# Patient Record
Sex: Female | Born: 1960
Health system: Southern US, Community
[De-identification: ages and names within clinical notes are randomized; demographics above are authoritative.]

## PROBLEM LIST (undated history)

## (undated) DIAGNOSIS — T7840XA Allergy, unspecified, initial encounter: Secondary | ICD-10-CM

## (undated) DIAGNOSIS — G4730 Sleep apnea, unspecified: Secondary | ICD-10-CM

## (undated) DIAGNOSIS — E039 Hypothyroidism, unspecified: Secondary | ICD-10-CM

## (undated) DIAGNOSIS — M1990 Unspecified osteoarthritis, unspecified site: Secondary | ICD-10-CM

## (undated) DIAGNOSIS — Z87440 Personal history of urinary (tract) infections: Secondary | ICD-10-CM

## (undated) HISTORY — PX: KNEE SURGERY: SHX244

## (undated) HISTORY — DX: Allergy, unspecified, initial encounter: T78.40XA

## (undated) HISTORY — DX: Hypothyroidism, unspecified: E03.9

## (undated) HISTORY — DX: Personal history of urinary (tract) infections: Z87.440

## (undated) HISTORY — PX: JOINT REPLACEMENT: SHX530

## (undated) HISTORY — DX: Unspecified osteoarthritis, unspecified site: M19.90

## (undated) HISTORY — PX: TUBAL LIGATION: SHX77

## (undated) HISTORY — DX: Sleep apnea, unspecified: G47.30

---

## 1998-06-28 HISTORY — PX: ENDOMETRIAL ABLATION: SHX621

## 2001-07-04 ENCOUNTER — Other Ambulatory Visit: Admission: RE | Admit: 2001-07-04 | Discharge: 2001-07-04 | Payer: Self-pay | Admitting: Family Medicine

## 2004-08-04 ENCOUNTER — Ambulatory Visit: Payer: Self-pay | Admitting: Family Medicine

## 2005-07-08 ENCOUNTER — Ambulatory Visit: Payer: Self-pay | Admitting: Obstetrics and Gynecology

## 2005-09-08 ENCOUNTER — Ambulatory Visit: Payer: Self-pay | Admitting: Family Medicine

## 2006-10-12 ENCOUNTER — Ambulatory Visit: Payer: Self-pay | Admitting: Obstetrics and Gynecology

## 2006-12-13 ENCOUNTER — Other Ambulatory Visit: Payer: Self-pay

## 2006-12-13 ENCOUNTER — Ambulatory Visit: Payer: Self-pay | Admitting: Obstetrics and Gynecology

## 2007-12-12 ENCOUNTER — Ambulatory Visit: Payer: Self-pay | Admitting: Obstetrics and Gynecology

## 2008-12-23 ENCOUNTER — Ambulatory Visit: Payer: Self-pay | Admitting: Obstetrics and Gynecology

## 2008-12-24 ENCOUNTER — Ambulatory Visit: Payer: Self-pay | Admitting: Obstetrics and Gynecology

## 2009-01-31 ENCOUNTER — Ambulatory Visit: Payer: Self-pay | Admitting: Gastroenterology

## 2009-12-30 ENCOUNTER — Ambulatory Visit: Payer: Self-pay | Admitting: Obstetrics and Gynecology

## 2011-04-06 ENCOUNTER — Ambulatory Visit: Payer: Self-pay | Admitting: Internal Medicine

## 2011-04-20 ENCOUNTER — Ambulatory Visit: Payer: Self-pay | Admitting: Gastroenterology

## 2012-04-25 ENCOUNTER — Ambulatory Visit: Payer: Self-pay | Admitting: Family Medicine

## 2013-06-08 ENCOUNTER — Telehealth: Payer: Self-pay | Admitting: Internal Medicine

## 2013-06-08 NOTE — Telephone Encounter (Signed)
left message for pt to call office please let her know about appointment date and time

## 2013-06-08 NOTE — Telephone Encounter (Signed)
Pt called stating you saw her friend Zaida hill this morning and wanted to know if you would take her as a new patient

## 2013-06-08 NOTE — Telephone Encounter (Signed)
Ok to schedule.

## 2013-06-13 NOTE — Telephone Encounter (Signed)
Per vanessa notes pt aware of appointment

## 2013-06-28 HISTORY — PX: BREAST BIOPSY: SHX20

## 2013-09-28 ENCOUNTER — Ambulatory Visit: Payer: Self-pay | Admitting: Internal Medicine

## 2013-11-05 ENCOUNTER — Ambulatory Visit (INDEPENDENT_AMBULATORY_CARE_PROVIDER_SITE_OTHER): Payer: BC Managed Care – PPO | Admitting: Internal Medicine

## 2013-11-05 ENCOUNTER — Encounter: Payer: Self-pay | Admitting: Internal Medicine

## 2013-11-05 ENCOUNTER — Encounter (INDEPENDENT_AMBULATORY_CARE_PROVIDER_SITE_OTHER): Payer: Self-pay

## 2013-11-05 VITALS — BP 110/70 | HR 82 | Temp 99.2°F | Ht 66.0 in | Wt 203.8 lb

## 2013-11-05 DIAGNOSIS — G4710 Hypersomnia, unspecified: Secondary | ICD-10-CM

## 2013-11-05 DIAGNOSIS — Z1239 Encounter for other screening for malignant neoplasm of breast: Secondary | ICD-10-CM

## 2013-11-05 DIAGNOSIS — R400 Somnolence: Secondary | ICD-10-CM

## 2013-11-05 DIAGNOSIS — Z9109 Other allergy status, other than to drugs and biological substances: Secondary | ICD-10-CM

## 2013-11-05 DIAGNOSIS — E039 Hypothyroidism, unspecified: Secondary | ICD-10-CM

## 2013-11-05 DIAGNOSIS — R109 Unspecified abdominal pain: Secondary | ICD-10-CM

## 2013-11-05 DIAGNOSIS — R5381 Other malaise: Secondary | ICD-10-CM

## 2013-11-05 DIAGNOSIS — E7800 Pure hypercholesterolemia, unspecified: Secondary | ICD-10-CM

## 2013-11-05 DIAGNOSIS — R5383 Other fatigue: Secondary | ICD-10-CM

## 2013-11-05 DIAGNOSIS — N951 Menopausal and female climacteric states: Secondary | ICD-10-CM

## 2013-11-05 NOTE — Progress Notes (Signed)
Pre visit review using our clinic review tool, if applicable. No additional management support is needed unless otherwise documented below in the visit note. 

## 2013-11-12 ENCOUNTER — Encounter: Payer: Self-pay | Admitting: Internal Medicine

## 2013-11-12 DIAGNOSIS — R5383 Other fatigue: Secondary | ICD-10-CM | POA: Insufficient documentation

## 2013-11-12 DIAGNOSIS — E7800 Pure hypercholesterolemia, unspecified: Secondary | ICD-10-CM | POA: Insufficient documentation

## 2013-11-12 DIAGNOSIS — R109 Unspecified abdominal pain: Secondary | ICD-10-CM | POA: Insufficient documentation

## 2013-11-12 DIAGNOSIS — N951 Menopausal and female climacteric states: Secondary | ICD-10-CM | POA: Insufficient documentation

## 2013-11-12 DIAGNOSIS — Z9109 Other allergy status, other than to drugs and biological substances: Secondary | ICD-10-CM | POA: Insufficient documentation

## 2013-11-12 DIAGNOSIS — R400 Somnolence: Secondary | ICD-10-CM | POA: Insufficient documentation

## 2013-11-12 DIAGNOSIS — E039 Hypothyroidism, unspecified: Secondary | ICD-10-CM | POA: Insufficient documentation

## 2013-11-12 NOTE — Assessment & Plan Note (Addendum)
Low cholesterol diet and exercise.  Will follow.  Follow lipid panel.

## 2013-11-12 NOTE — Assessment & Plan Note (Signed)
Saw Dr Kerrie PleasureMoryati.  Was placed on Armour Thyroid.  States stable on this regimen.  Follow.  Check tsh.

## 2013-11-12 NOTE — Progress Notes (Signed)
   Subjective:    Patient ID: Carol Jimenez, female    DOB: 07/23/1960, 53 y.o.   MRN: [id number]  HPI 53 year old female with past history of hypothyroidism and allergies.  She comes in today to follow up on these issues as well as to establish care.  Former patient of [facility], Dr Toya SmothersKnowles Jonas, Dr Kerrie PleasureMoryati and Dr Bethann PunchesMark Miller.  She states she takes hctz for fluid retention.  Saw Dr Kerrie PleasureMoryati for her thyroid.  This has been stable on her current regimen.  She does have problems with mood swings and hot flashes.  States she stays hot.  Previously had post menopausal spotting.  Biopsy negative.  No further bleeding.  She does report "constant pain" in her right side.  Has been present for several years.  Notices when she lies down.  Had an ultrasound.  Diagnosed with fatty liver.  Also report some fatigue.  Increased daytime somnolence.  Wakes up tired and does not feel rested.     Past Medical History  Diagnosis Date  . Allergy   . Hypothyroidism   . Hx: UTI (urinary tract infection)     Outpatient Encounter Prescriptions as of 11/05/2013  Medication Sig  . estradiol (ESTRACE) 1 MG tablet Take 1 mg by mouth daily.  . hydrochlorothiazide (HYDRODIURIL) 25 MG tablet Take 25 mg by mouth daily.  . progesterone (PROMETRIUM) 100 MG capsule Take 100 mg by mouth daily.  Marland Kitchen. thyroid (ARMOUR) 90 MG tablet Take 90 mg by mouth daily.    Review of Systems Patient denies any headache, lightheadedness or dizziness.  No significant sinus symptoms now.  No chest pain, tightness or palpitations.  No increased shortness of breath, cough or congestion.  No nausea or vomiting.  No acid reflux.  No abdominal pain or cramping.  No bowel change, such as diarrhea, constipation, BRBPR or melana.  No urine change.   Increased fatigue and daytime somnolence as outlined.  Persistent right side pain as outlined.       Objective:   Physical Exam Filed Vitals:   11/05/13 1337  BP: 110/70  Pulse: 82  Temp: 99.2  F (37.3 C)   Blood pressure recheck:  44120/3976  53 year old female in no acute distress.   HEENT:  Nares- clear.  Oropharynx - without lesions. NECK:  Supple.  Nontender.  No audible bruit.  HEART:  Appears to be regular. LUNGS:  No crackles or wheezing audible.  Respirations even and unlabored.  RADIAL PULSE:  Equal bilaterally.   ABDOMEN:  Soft, nontender.  Bowel sounds present and normal.  No audible abdominal bruit.    EXTREMITIES:  No increased edema present.  DP pulses palpable and equal bilaterally.          Assessment & Plan:  HEALTH MAINTENANCE.  Schedule her for a physical next visit.  States last 18 months ago.  Obtain records.  Schedule mammogram.  Last colonoscopy 2012 - Dr Marva PandaSkulskie.    I spent 45 minutes with the patient and more than 50% of the time was spent in consultation regarding the above.

## 2013-11-12 NOTE — Assessment & Plan Note (Signed)
Has hot flashes and mood swings.  Stays hot.  On estradiol and prometrium.  Discussed other treatment options including Effexor.  Follow.

## 2013-11-12 NOTE — Assessment & Plan Note (Addendum)
Does report some increased fatigue.  Discussed possible etiologies.  Review recent labs.  Obtain split night sleep study as outlined.  Follow.

## 2013-11-12 NOTE — Assessment & Plan Note (Signed)
Does not feel rested.  Increased fatigue.  Snoring.  Hard to lose weight and describes some lower extremity swelling.  Concern over possible sleep apnea.  Will obtain split night sleep study.  Further w/up and treatment pending results.

## 2013-11-12 NOTE — Assessment & Plan Note (Signed)
Persistent right side pain.  Some reproducible pain to palpation over the right lower anterior ribs.  Had ultrasound previously.  States told had fatty liver.  Obtain records.  Further w/up pending review.  Check routine labs.

## 2013-11-12 NOTE — Assessment & Plan Note (Signed)
Stable

## 2014-01-09 ENCOUNTER — Telehealth: Payer: Self-pay | Admitting: *Deleted

## 2014-01-09 NOTE — Telephone Encounter (Signed)
I saw her as a new pt in 5/15.  I assume this denial is for her sleep study.  We had discussed sleep study at her appt.  Notify her that she can contact her insurance company and see where they will cover a sleep study and then we can reschedule.   Thanks.

## 2014-01-09 NOTE — Telephone Encounter (Signed)
Received a letter stating that pt cannot be studied in their lab due to insurance conflicts. (pt was notified via detailed voicemail). Insurance has denied due to lack of meeting criteria for in-lab study by medical direction.

## 2014-01-09 NOTE — Telephone Encounter (Signed)
Received more information from [facility] regarding the Sleep Study denial. Its seems that they would prefer trying a "in-home" sleep study test. Or they are needing more documentation to cover the testing to meet criteria. Information placed in your folder for review.

## 2014-01-09 NOTE — Telephone Encounter (Signed)
Will review when I return to work.

## 2014-01-21 ENCOUNTER — Ambulatory Visit: Payer: Self-pay | Admitting: Internal Medicine

## 2014-01-23 ENCOUNTER — Encounter: Payer: Self-pay | Admitting: General Surgery

## 2014-01-23 ENCOUNTER — Telehealth: Payer: Self-pay | Admitting: *Deleted

## 2014-01-23 ENCOUNTER — Encounter: Payer: Self-pay | Admitting: Internal Medicine

## 2014-01-23 ENCOUNTER — Ambulatory Visit: Payer: Self-pay | Admitting: Internal Medicine

## 2014-01-23 ENCOUNTER — Other Ambulatory Visit: Payer: Self-pay | Admitting: Internal Medicine

## 2014-01-23 DIAGNOSIS — R928 Other abnormal and inconclusive findings on diagnostic imaging of breast: Secondary | ICD-10-CM

## 2014-01-23 LAB — HM MAMMOGRAPHY

## 2014-01-23 NOTE — Telephone Encounter (Signed)
Spoke to pt.  Discussed mammogram resutls and need for biopsy.  After discussion, we will refer her to surgery for evaluation and biopsy.  I have placed the order for the referral.  Pt aware.  Please let Marchelle Folksmanda know.  Thanks.

## 2014-01-23 NOTE — Telephone Encounter (Signed)
Left detailed message on Carol Jimenez's identified VM.

## 2014-01-23 NOTE — Telephone Encounter (Signed)
Carol Jimenez from [facility] called states it is recommended pt to have a Core Biopsy of Left Breast based on mammogram and additional films.  Requests whether pt to be scheduled by [facility] or if we will refer pt to surgeon.  Please advise

## 2014-01-23 NOTE — Progress Notes (Signed)
Pt notified of mammogram results - Bir[facility] IV.  Informed of need for biopsy.  She agrees to referral to Dr Lemar LivingsByrnett.  Order placed for referral.

## 2014-01-30 ENCOUNTER — Other Ambulatory Visit: Payer: BC Managed Care – PPO

## 2014-01-30 ENCOUNTER — Ambulatory Visit (INDEPENDENT_AMBULATORY_CARE_PROVIDER_SITE_OTHER): Payer: BC Managed Care – PPO | Admitting: General Surgery

## 2014-01-30 ENCOUNTER — Encounter: Payer: Self-pay | Admitting: General Surgery

## 2014-01-30 VITALS — BP 150/82 | HR 80 | Resp 12 | Ht 66.0 in | Wt 202.0 lb

## 2014-01-30 DIAGNOSIS — N63 Unspecified lump in unspecified breast: Secondary | ICD-10-CM

## 2014-01-30 DIAGNOSIS — R928 Other abnormal and inconclusive findings on diagnostic imaging of breast: Secondary | ICD-10-CM

## 2014-01-30 NOTE — Patient Instructions (Addendum)
Continue self breast exams. Call office for any new breast issues or concerns.    CARE AFTER BREAST BIOPSY  1. Leave the dressing on that your doctor applied after surgery. It is waterproof. You may bathe, shower and/or swim. The dressing will probably remain intact until your return office visit. If the dressing comes off, you will see small strips of tape against your skin on the incision. Do not remove these strips.  2. You may want to use a gauze,cloth or similar protection in your bra to prevent rubbing against your dressing and incision. This is not necessary, but you may feel more comfortable doing so.  3. It is recommended that you wear a bra day and night to give support to the breast. This will prevent the weight of the breast from pulling on the incision.  4. Your breast will feel hard and lumpy under the incision. Do not be alarmed. This is the underlying stitching of tissue. Softening of this tissue will occur in time.  5. Make sure you call the office and schedule an appointment in one week after your surgery. The office phone number is [phone]. The nurses at [facility] may have already done this for you.  6. You will notice about a week after your office visit that the strips of the tape on your incision will begin to loosen. These may then be removed.  7. Report to your doctor any of the following:  * Severe pain not relieved by your pain medication  *Redness of the incision  * Drainage from the incision  *Fever greater than 101 degrees 

## 2014-01-30 NOTE — Progress Notes (Signed)
Patient ID: Carol Jimenez, female   DOB: February 25, 1961, 52 y.o.   MRN: [id number]  Chief Complaint  Patient presents with  . Other    mammogram    HPI Carol Jimenez is a 53 y.o. female.  who presents for a breast evaluation. The most recent mammogram and ultrasound was done on 01-21-14.  Patient does not perform regular self breast checks and gets regular mammograms done.  She denies any breast injury or trauma. Denies any family history of breast cancer. States she can not feel anything different in the breast.  HPI  Past Medical History  Diagnosis Date  . Allergy   . Hypothyroidism   . Hx: UTI (urinary tract infection)     Past Surgical History  Procedure Laterality Date  . Cesarean section    . Endometrial ablation  2000    Family History  Problem Relation Age of Onset  . Congestive Heart Failure Mother   . Diabetes Mother   . Hypertension Father   . Colon polyps    . Esophageal cancer Father 38    [facility]  . COPD Mother     Social History History  Substance Use Topics  . Smoking status: Never Smoker   . Smokeless tobacco: Never Used  . Alcohol Use: Yes     Comment: rare    Allergies  Allergen Reactions  . Sulfa Antibiotics Rash    Current Outpatient Prescriptions  Medication Sig Dispense Refill  . estradiol (ESTRACE) 1 MG tablet Take 1 mg by mouth daily.      . hydrochlorothiazide (HYDRODIURIL) 25 MG tablet Take 25 mg by mouth daily.      . meloxicam (MOBIC) 15 MG tablet Take 15 mg by mouth daily.      . montelukast (SINGULAIR) 10 MG tablet Take 10 mg by mouth at bedtime.      . progesterone (PROMETRIUM) 100 MG capsule Take 100 mg by mouth daily.      Marland Kitchen thyroid (ARMOUR) 90 MG tablet Take 90 mg by mouth daily.       No current facility-administered medications for this visit.    Review of Systems Review of Systems  Constitutional: Negative.   Respiratory: Negative.   Cardiovascular: Negative.     Blood pressure 150/82, pulse 80, resp.  rate 12, height 5\' 6"  (1.676 m), weight 202 lb (91.627 kg), last menstrual period 12/26/2012.  Physical Exam Physical Exam  Constitutional: She is oriented to person, place, and time. She appears well-developed and well-nourished.  Neck: Neck supple.  Cardiovascular: Normal rate, regular rhythm and normal heart sounds.   Pulmonary/Chest: Effort normal and breath sounds normal. Right breast exhibits no inverted nipple, no mass, no nipple discharge, no skin change and no tenderness. Left breast exhibits no inverted nipple, no mass, no nipple discharge, no skin change and no tenderness.  Lymphadenopathy:    She has no cervical adenopathy.    She has no axillary adenopathy.  Neurological: She is alert and oriented to person, place, and time.  Skin: Skin is warm and dry.    Data Reviewed Bilateral screening mammograms completed 01/21/2014 suggested out the mass the left breast. BI-RAD-0. Review of the 2013 films with retrospective eyes suggest possible presents at that time.  Focal spot compression views and ultrasound dated 01/23/2014 showed a 9 mm mixed echogenicity mass at the 6:30 o'clock position of the left breast for which biopsy was recommended. BI-RAD-4.  Ultrasound examination of the left breast with special attention to  the lower inner quadrant at the 6:00 position, 4 cm from the nipple showed a 0.6 x 0.7 x 0.7 cm mixed echogenicity area with a focal acoustic shadowing corresponding to the [facility] study. The patient was amenable to core biopsy. This was completed using 6 cc of 0.5% Xylocaine with 0.25% Marcaine with 1-200,000 units of epinephrine. A 14-gauge vacuum biopsy device was used and a core samples obtained. A postbiopsy clip was placed. Skin defect was closed with benzoin and Steri-Strips followed by Telfa and Tegaderm dressing. The procedure was well tolerated. Postbiopsy instructions were provided.  Assessment    Developing asymmetry in the left breast.    Plan    The  patient will be contacted when the pathology is available. Assuming benign result she'll be asked to return in 6 months with a repeat mammogram of the left breast.     PCP: Letta MedianScott, Charlene   Marquett Bertoli W 02/01/2014, 10:05 AM

## 2014-01-31 ENCOUNTER — Telehealth: Payer: Self-pay | Admitting: *Deleted

## 2014-01-31 LAB — PATHOLOGY

## 2014-01-31 NOTE — Telephone Encounter (Signed)
Notified patient as instructed, no cancer per Dr. Lemar LivingsByrnett, patient pleased. Discussed follow-up appointments next week, patient agrees

## 2014-02-01 DIAGNOSIS — R928 Other abnormal and inconclusive findings on diagnostic imaging of breast: Secondary | ICD-10-CM | POA: Insufficient documentation

## 2014-02-06 ENCOUNTER — Ambulatory Visit (INDEPENDENT_AMBULATORY_CARE_PROVIDER_SITE_OTHER): Payer: Self-pay | Admitting: *Deleted

## 2014-02-06 DIAGNOSIS — N63 Unspecified lump in unspecified breast: Secondary | ICD-10-CM

## 2014-02-06 NOTE — Progress Notes (Signed)
Patient here today for follow up post left breast biopsy.  Minimal bruising noted.  The patient is aware that a heating pad may be used for comfort as needed.  Aware of pathology. Follow up as scheduled in six months.

## 2014-02-12 ENCOUNTER — Encounter: Payer: Self-pay | Admitting: Internal Medicine

## 2014-02-12 ENCOUNTER — Other Ambulatory Visit (HOSPITAL_COMMUNITY)
Admission: RE | Admit: 2014-02-12 | Discharge: 2014-02-12 | Disposition: A | Discharge status: Home / Self Care | Payer: BC Managed Care – PPO | Source: Ambulatory Visit | Attending: Internal Medicine | Admitting: Internal Medicine

## 2014-02-12 ENCOUNTER — Ambulatory Visit (INDEPENDENT_AMBULATORY_CARE_PROVIDER_SITE_OTHER): Payer: BC Managed Care – PPO | Admitting: Internal Medicine

## 2014-02-12 VITALS — BP 120/80 | HR 84 | Temp 98.2°F | Ht 66.25 in | Wt 205.5 lb

## 2014-02-12 DIAGNOSIS — E039 Hypothyroidism, unspecified: Secondary | ICD-10-CM

## 2014-02-12 DIAGNOSIS — R928 Other abnormal and inconclusive findings on diagnostic imaging of breast: Secondary | ICD-10-CM

## 2014-02-12 DIAGNOSIS — Z01419 Encounter for gynecological examination (general) (routine) without abnormal findings: Secondary | ICD-10-CM | POA: Insufficient documentation

## 2014-02-12 DIAGNOSIS — Z1151 Encounter for screening for human papillomavirus (HPV): Secondary | ICD-10-CM | POA: Diagnosis present

## 2014-02-12 DIAGNOSIS — E559 Vitamin D deficiency, unspecified: Secondary | ICD-10-CM

## 2014-02-12 DIAGNOSIS — N951 Menopausal and female climacteric states: Secondary | ICD-10-CM

## 2014-02-12 DIAGNOSIS — E7800 Pure hypercholesterolemia, unspecified: Secondary | ICD-10-CM

## 2014-02-12 DIAGNOSIS — R5381 Other malaise: Secondary | ICD-10-CM

## 2014-02-12 DIAGNOSIS — Z124 Encounter for screening for malignant neoplasm of cervix: Secondary | ICD-10-CM

## 2014-02-12 DIAGNOSIS — R5383 Other fatigue: Secondary | ICD-10-CM

## 2014-02-12 DIAGNOSIS — Z9109 Other allergy status, other than to drugs and biological substances: Secondary | ICD-10-CM

## 2014-02-12 MED ORDER — ESTRADIOL 0.5 MG PO TABS: 0.5000 mg | ORAL_TABLET | Freq: Every day | ORAL | Status: DC

## 2014-02-12 NOTE — Progress Notes (Signed)
Pre visit review using our clinic review tool, if applicable. No additional management support is needed unless otherwise documented below in the visit note. 

## 2014-02-14 LAB — CYTOLOGY - PAP

## 2014-02-15 ENCOUNTER — Encounter: Payer: Self-pay | Admitting: Internal Medicine

## 2014-02-18 ENCOUNTER — Encounter: Payer: Self-pay | Admitting: Internal Medicine

## 2014-02-18 ENCOUNTER — Encounter: Payer: Self-pay | Admitting: *Deleted

## 2014-02-18 ENCOUNTER — Telehealth: Payer: Self-pay | Admitting: Internal Medicine

## 2014-02-18 NOTE — Assessment & Plan Note (Signed)
Had hot flashes and mood swings.  On estradiol and prometrium.  Have discussed other treatment options including Effexor.  We will decrease her estrogen to 1/2 of her current dose daily.  Follow.  Plan to eventually taper off in the future.

## 2014-02-18 NOTE — Telephone Encounter (Signed)
Per previous visit, a split night sleep study was ordered.  If pt had, need to obtain results.  If not performed, need to see if pt agreeable to be scheduled.

## 2014-02-18 NOTE — Telephone Encounter (Signed)
My chart message sent to pt.

## 2014-02-18 NOTE — Progress Notes (Signed)
   Subjective:    Patient ID: Carol Jimenez, female    DOB: [birth year], 53 y.o.   MRN: [id number]  HPI 53 year old female with past history of hypothyroidism and allergies.  She comes in today to follow up on these issues as well as for a complete physical exam.   She states she takes hctz for fluid retention.  Saw Dr Kerrie Pleasure for her thyroid.  This has been stable on her current regimen.  She reports some increased fatigue.  Just recently saw Dr Lemar Livings for  Abnormal mammogram.  Biopsy negative.  Is planning for f/u mammogram in 2/16.  Will continue to be followed by Dr Lemar Livings.  Is on estrogen.  Has been on estrogen at least 5-6 years.  Was previously not sleeping well.  This is controlled.  We discussed stopping or tapering estrogen.       Past Medical History  Diagnosis Date  . Allergy   . Hypothyroidism   . Hx: UTI (urinary tract infection)     Outpatient Encounter Prescriptions as of 02/12/2014  Medication Sig  . hydrochlorothiazide (HYDRODIURIL) 25 MG tablet Take 25 mg by mouth daily.  . meloxicam (MOBIC) 15 MG tablet Take 15 mg by mouth daily.  . montelukast (SINGULAIR) 10 MG tablet Take 10 mg by mouth at bedtime.  . progesterone (PROMETRIUM) 100 MG capsule Take 100 mg by mouth daily.  Marland Kitchen thyroid (ARMOUR) 90 MG tablet Take 90 mg by mouth daily.  . [DISCONTINUED] estradiol (ESTRACE) 1 MG tablet Take 1 mg by mouth daily.  Marland Kitchen estradiol (ESTRACE) 0.5 MG tablet Take 1 tablet (0.5 mg total) by mouth daily.    Review of Systems Patient denies any headache, lightheadedness or dizziness.  No significant sinus symptoms.  No chest pain, tightness or palpitations.  No increased shortness of breath, cough or congestion.  No nausea or vomiting.  No acid reflux.  No abdominal pain or cramping.  No bowel change, such as diarrhea, constipation, BRBPR or melana.  No urine change.   Increased fatigue as outlined.       Objective:   Physical Exam  Filed Vitals:   02/12/14 1038  BP: 120/80    Pulse: 84  Temp: 98.2 F (76.64 C)   53 year old female in no acute distress.   HEENT:  Nares- clear.  Oropharynx - without lesions. NECK:  Supple.  Nontender.  No audible bruit.  HEART:  Appears to be regular. LUNGS:  No crackles or wheezing audible.  Respirations even and unlabored.  RADIAL PULSE:  Equal bilaterally.    BREASTS:  No nipple discharge or nipple retraction present.  Could not appreciate any distinct nodules or axillary adenopathy.  ABDOMEN:  Soft, nontender.  Bowel sounds present and normal.  No audible abdominal bruit.  GU:  Normal external genitalia.  Vaginal vault without lesions.  Cervix identified.  Pap performed. Could not appreciate any adnexal masses or tenderness.   RECTAL:  Heme negative.   EXTREMITIES:  No increased edema present.  DP pulses palpable and equal bilaterally.          Assessment & Plan:  HEALTH MAINTENANCE.  Physical today.  Recent abnormal mammogram.  Saw dr Lemar Livings.  S/p benign biopsy.  planning for f/up mammogram in 07/2014.   Last colonoscopy 2012 - Dr Marva Panda.    I spent 25 minutes with the patient and more than 50% of the time was spent in consultation regarding the above.

## 2014-02-18 NOTE — Assessment & Plan Note (Signed)
Saw Dr Byrnett.  S/p biopsy.  Benign.  Due to f/u with Dr Byrnett and f/u mammogram in 07/2014.    

## 2014-02-18 NOTE — Telephone Encounter (Signed)
Mailed unread [employer] along with one sent this morning

## 2014-02-18 NOTE — Telephone Encounter (Signed)
Mailed unread mychart message

## 2014-02-18 NOTE — Telephone Encounter (Signed)
Sent mychart message

## 2014-02-18 NOTE — Assessment & Plan Note (Signed)
Stable

## 2014-02-18 NOTE — Assessment & Plan Note (Signed)
Low cholesterol diet and exercise.  Will follow.  Follow lipid panel.    

## 2014-02-18 NOTE — Telephone Encounter (Signed)
See my chart message

## 2014-02-18 NOTE — Assessment & Plan Note (Signed)
Does report some increased fatigue.  Have discussed possible etiologies.  Was scheduled for split night sleep study.  Need to obtain results.

## 2014-02-18 NOTE — Assessment & Plan Note (Signed)
Saw Dr Kerrie Pleasure.  Was placed on Armour Thyroid.  States stable on this regimen.  Follow.  Follow tsh.

## 2014-02-19 ENCOUNTER — Other Ambulatory Visit (INDEPENDENT_AMBULATORY_CARE_PROVIDER_SITE_OTHER): Payer: BC Managed Care – PPO

## 2014-02-19 DIAGNOSIS — R5381 Other malaise: Secondary | ICD-10-CM

## 2014-02-19 DIAGNOSIS — E7800 Pure hypercholesterolemia, unspecified: Secondary | ICD-10-CM

## 2014-02-19 DIAGNOSIS — E559 Vitamin D deficiency, unspecified: Secondary | ICD-10-CM

## 2014-02-19 DIAGNOSIS — R5383 Other fatigue: Secondary | ICD-10-CM

## 2014-02-19 LAB — LIPID PANEL
Cholesterol: 188 mg/dL (ref 0–200)
HDL: 44.2 mg/dL (ref >39.00)
NonHDL: 143.8
Total CHOL/HDL Ratio: 4
Triglycerides: 242 mg/dL — ABNORMAL HIGH (ref 0.0–149.0)
VLDL: 48.4 mg/dL — ABNORMAL HIGH (ref 0.0–40.0)

## 2014-02-19 LAB — CBC WITH DIFFERENTIAL/PLATELET
Basophils Absolute: 0 10*3/uL (ref 0.0–0.1)
Basophils Relative: 0.4 % (ref 0.0–3.0)
Eosinophils Absolute: 0 10*3/uL (ref 0.0–0.7)
Eosinophils Relative: 0.7 % (ref 0.0–5.0)
HEMATOCRIT: 41.7 % (ref 36.0–46.0)
HEMOGLOBIN: 14.4 g/dL (ref 12.0–15.0)
LYMPHS ABS: 1.6 10*3/uL (ref 0.7–4.0)
Lymphocytes Relative: 23.8 % (ref 12.0–46.0)
MCHC: 34.4 g/dL (ref 30.0–36.0)
MCV: 94.2 fl (ref 78.0–100.0)
MONOS PCT: 8.6 % (ref 3.0–12.0)
Monocytes Absolute: 0.6 10*3/uL (ref 0.1–1.0)
NEUTROS ABS: 4.5 10*3/uL (ref 1.4–7.7)
Neutrophils Relative %: 66.5 % (ref 43.0–77.0)
Platelets: 217 10*3/uL (ref 150.0–400.0)
RBC: 4.43 Mil/uL (ref 3.87–5.11)
RDW: 13.1 % (ref 11.5–15.5)
WBC: 6.8 10*3/uL (ref 4.0–10.5)

## 2014-02-19 LAB — TSH: TSH: 1.6 u[IU]/mL (ref 0.35–4.50)

## 2014-02-19 LAB — COMPREHENSIVE METABOLIC PANEL
ALT: 32 U/L (ref 0–35)
AST: 44 U/L — ABNORMAL HIGH (ref 0–37)
Albumin: 3.8 g/dL (ref 3.5–5.2)
Alkaline Phosphatase: 52 U/L (ref 39–117)
BILIRUBIN TOTAL: 0.6 mg/dL (ref 0.2–1.2)
BUN: 11 mg/dL (ref 6–23)
CALCIUM: 8.9 mg/dL (ref 8.4–10.5)
CHLORIDE: 100 meq/L (ref 96–112)
CO2: 29 mEq/L (ref 19–32)
CREATININE: 0.6 mg/dL (ref 0.4–1.2)
GFR: 102.79 mL/min (ref >60.00)
Glucose, Bld: 104 mg/dL — ABNORMAL HIGH (ref 70–99)
Potassium: 3.7 mEq/L (ref 3.5–5.1)
Sodium: 136 mEq/L (ref 135–145)
Total Protein: 7.1 g/dL (ref 6.0–8.3)

## 2014-02-19 LAB — VITAMIN D 25 HYDROXY (VIT D DEFICIENCY, FRACTURES): VITD: 34.25 ng/mL (ref 30.00–100.00)

## 2014-02-19 LAB — LDL CHOLESTEROL, DIRECT: Direct LDL: 124.7 mg/dL

## 2014-02-20 ENCOUNTER — Encounter: Payer: Self-pay | Admitting: *Deleted

## 2014-02-20 ENCOUNTER — Encounter: Payer: Self-pay | Admitting: Internal Medicine

## 2014-02-20 ENCOUNTER — Other Ambulatory Visit: Payer: Self-pay | Admitting: Internal Medicine

## 2014-02-20 DIAGNOSIS — R739 Hyperglycemia, unspecified: Secondary | ICD-10-CM

## 2014-02-20 DIAGNOSIS — R945 Abnormal results of liver function studies: Secondary | ICD-10-CM

## 2014-02-20 DIAGNOSIS — R7989 Other specified abnormal findings of blood chemistry: Secondary | ICD-10-CM

## 2014-02-20 NOTE — Progress Notes (Signed)
Order placed for f/u labs.  

## 2014-02-22 ENCOUNTER — Encounter: Payer: Self-pay | Admitting: Internal Medicine

## 2014-03-07 ENCOUNTER — Encounter: Payer: Self-pay | Admitting: Internal Medicine

## 2014-03-07 ENCOUNTER — Other Ambulatory Visit (INDEPENDENT_AMBULATORY_CARE_PROVIDER_SITE_OTHER): Payer: BC Managed Care – PPO

## 2014-03-07 DIAGNOSIS — R739 Hyperglycemia, unspecified: Secondary | ICD-10-CM

## 2014-03-07 DIAGNOSIS — R945 Abnormal results of liver function studies: Secondary | ICD-10-CM

## 2014-03-07 DIAGNOSIS — R7309 Other abnormal glucose: Secondary | ICD-10-CM

## 2014-03-07 DIAGNOSIS — R7989 Other specified abnormal findings of blood chemistry: Secondary | ICD-10-CM

## 2014-03-07 LAB — HEPATIC FUNCTION PANEL
ALT: 45 U/L — ABNORMAL HIGH (ref 0–35)
AST: 57 U/L — ABNORMAL HIGH (ref 0–37)
Albumin: 4 g/dL (ref 3.5–5.2)
Alkaline Phosphatase: 51 U/L (ref 39–117)
Bilirubin, Direct: 0.1 mg/dL (ref 0.0–0.3)
Total Bilirubin: 0.6 mg/dL (ref 0.2–1.2)
Total Protein: 7.3 g/dL (ref 6.0–8.3)

## 2014-03-07 LAB — HEMOGLOBIN A1C: HEMOGLOBIN A1C: 5.9 % (ref 4.6–6.5)

## 2014-03-08 NOTE — Telephone Encounter (Signed)
Order placed for abdominal ultrasound to f/u on abnormal liver function tests.  Pt notified via my chart.

## 2014-03-13 ENCOUNTER — Ambulatory Visit: Payer: Self-pay | Admitting: Internal Medicine

## 2014-03-15 ENCOUNTER — Telehealth: Payer: Self-pay | Admitting: Internal Medicine

## 2014-03-15 NOTE — Telephone Encounter (Signed)
Pt notified of ultrasound results via my chart.  Ultrasound reveals fatty liver.

## 2014-03-23 ENCOUNTER — Ambulatory Visit: Payer: BC Managed Care – PPO

## 2014-03-23 ENCOUNTER — Ambulatory Visit (INDEPENDENT_AMBULATORY_CARE_PROVIDER_SITE_OTHER): Payer: BC Managed Care – PPO

## 2014-03-23 DIAGNOSIS — Z23 Encounter for immunization: Secondary | ICD-10-CM

## 2014-04-12 ENCOUNTER — Ambulatory Visit (INDEPENDENT_AMBULATORY_CARE_PROVIDER_SITE_OTHER): Payer: BC Managed Care – PPO | Admitting: Internal Medicine

## 2014-04-12 ENCOUNTER — Encounter: Payer: Self-pay | Admitting: Internal Medicine

## 2014-04-12 VITALS — BP 120/80 | HR 65 | Temp 98.9°F | Ht 66.25 in | Wt 196.5 lb

## 2014-04-12 DIAGNOSIS — R945 Abnormal results of liver function studies: Secondary | ICD-10-CM

## 2014-04-12 DIAGNOSIS — N951 Menopausal and female climacteric states: Secondary | ICD-10-CM

## 2014-04-12 DIAGNOSIS — E7800 Pure hypercholesterolemia, unspecified: Secondary | ICD-10-CM

## 2014-04-12 DIAGNOSIS — R928 Other abnormal and inconclusive findings on diagnostic imaging of breast: Secondary | ICD-10-CM

## 2014-04-12 DIAGNOSIS — E039 Hypothyroidism, unspecified: Secondary | ICD-10-CM

## 2014-04-12 DIAGNOSIS — R7989 Other specified abnormal findings of blood chemistry: Secondary | ICD-10-CM

## 2014-04-12 DIAGNOSIS — E669 Obesity, unspecified: Secondary | ICD-10-CM

## 2014-04-12 DIAGNOSIS — Z9109 Other allergy status, other than to drugs and biological substances: Secondary | ICD-10-CM

## 2014-04-12 DIAGNOSIS — Z91048 Other nonmedicinal substance allergy status: Secondary | ICD-10-CM

## 2014-04-12 MED ORDER — SCOPOLAMINE 1 MG/3DAYS TD PT72: 1.0000 | MEDICATED_PATCH | TRANSDERMAL | Status: DC

## 2014-04-13 ENCOUNTER — Other Ambulatory Visit: Payer: Self-pay | Admitting: Internal Medicine

## 2014-04-13 ENCOUNTER — Encounter: Payer: Self-pay | Admitting: Internal Medicine

## 2014-04-13 DIAGNOSIS — Z6833 Body mass index (BMI) 33.0-33.9, adult: Secondary | ICD-10-CM | POA: Insufficient documentation

## 2014-04-13 DIAGNOSIS — E7800 Pure hypercholesterolemia, unspecified: Secondary | ICD-10-CM

## 2014-04-13 DIAGNOSIS — R7989 Other specified abnormal findings of blood chemistry: Secondary | ICD-10-CM

## 2014-04-13 DIAGNOSIS — R739 Hyperglycemia, unspecified: Secondary | ICD-10-CM

## 2014-04-13 DIAGNOSIS — R945 Abnormal results of liver function studies: Secondary | ICD-10-CM | POA: Insufficient documentation

## 2014-04-13 NOTE — Progress Notes (Signed)
Order placed for f/u labs.  

## 2014-04-13 NOTE — Assessment & Plan Note (Signed)
Low cholesterol diet and exercise.  Will follow.  Follow lipid panel.  Cholesterol just checked LDL - 125, triglycerides 242.  Discussed diet and exercise.

## 2014-04-13 NOTE — Progress Notes (Signed)
Subjective:    Patient ID: Carol HoarElizabeth B Gudino, female    DOB: 01/22/1961, 53 y.o.   MRN: [id number]  HPI 53 year old female with past history of hypothyroidism and allergies.  She comes in today for a scheduled follow up.   She states she takes hctz for fluid retention.  Saw Dr Kerrie PleasureMoryati for her thyroid.  This has been stable on her current regimen.  Just recently saw Dr Lemar LivingsByrnett for  Abnormal mammogram.  Biopsy negative.  Is planning for f/u mammogram in 2/16.  Will continue to be followed by Dr Lemar LivingsByrnett.  Is on estrogen.  Has been on estrogen at least 5-6 years.  Last visit, discussed decreasing th dose.  She is now on 1/2 dose and tolerating.  Has adjusted her diet.  Is exercising.  Has lost weight.  Feels better.         Past Medical History  Diagnosis Date  . Allergy   . Hypothyroidism   . Hx: UTI (urinary tract infection)     Outpatient Encounter Prescriptions as of 04/12/2014  Medication Sig  . estradiol (ESTRACE) 0.5 MG tablet Take 1 tablet (0.5 mg total) by mouth daily.  . hydrochlorothiazide (HYDRODIURIL) 25 MG tablet Take 25 mg by mouth daily.  . meloxicam (MOBIC) 15 MG tablet Take 15 mg by mouth daily.  . montelukast (SINGULAIR) 10 MG tablet Take 10 mg by mouth at bedtime.  . progesterone (PROMETRIUM) 100 MG capsule Take 100 mg by mouth daily.  Marland Kitchen. thyroid (ARMOUR) 90 MG tablet Take 90 mg by mouth daily.  Marland Kitchen. scopolamine (TRANSDERM-SCOP) 1 MG/3DAYS Place 1 patch (1.5 mg total) onto the skin every 3 (three) days.    Review of Systems Patient denies any headache, lightheadedness or dizziness.  No sinus congestion or drainage.   No chest pain, tightness or palpitations.  No increased shortness of breath, cough or congestion.  No nausea or vomiting.  No acid reflux.  No abdominal pain or cramping.  No bowel change, such as diarrhea, constipation, BRBPR or melana.  No urine change.   Has adjusted her diet.  Exercising.  Has lost weight.  On 1/2 estrogen now.  Tolerating.  Some hot flashes.        Objective:   Physical Exam  Filed Vitals:   04/12/14 0936  BP: 120/80  Pulse: 65  Temp: 98.9 F (37.2 C)   Blood pressure recheck:  35112/7476  53 year old female in no acute distress.   HEENT:  Nares- clear.  Oropharynx - without lesions. NECK:  Supple.  Nontender.  No audible bruit.  HEART:  Appears to be regular. LUNGS:  No crackles or wheezing audible.  Respirations even and unlabored.  RADIAL PULSE:  Equal bilaterally.   ABDOMEN:  Soft, nontender.  Bowel sounds present and normal.  No audible abdominal bruit.   EXTREMITIES:  No increased edema present.  DP pulses palpable and equal bilaterally.          Assessment & Plan:  HEALTH MAINTENANCE.  Physical 02/12/14.  Recent abnormal mammogram.  Saw dr Lemar LivingsByrnett.  S/p benign biopsy.  planning for f/up mammogram in 07/2014.   Last colonoscopy 2012 - Dr Marva PandaSkulskie.    Problem List Items Addressed This Visit   Abnormal liver function tests - Primary     Found to have elevated ast/alt.  Recent check elevated - AST 57 and ALT 45.  Has adjusted her diet and is exercising.  Follow.       Relevant Orders  Hepatic function panel   Abnormal mammogram of left breast     Saw Dr Lemar LivingsByrnett.  S/p biopsy.  Benign.  Due to f/u with Dr Lemar LivingsByrnett and f/u mammogram in 07/2014.       Environmental allergies     Stable.       Hypercholesterolemia     Low cholesterol diet and exercise.  Will follow.  Follow lipid panel.  Cholesterol just checked LDL - 125, triglycerides 242.  Discussed diet and exercise.        Hypothyroid     Saw Dr Kerrie PleasureMoryati.  Was placed on Armour Thyroid.  Has been stable on this regimen.  Follow.  Follow tsh.       Menopausal symptoms     Has decreased her estrogen to 1/2 dose daily.  Follow.  Plan to eventually taper off in the future.  She is tolerating.  Follow.           Obesity (BMI 30.0-34.9)     Discussed diet and exercise.  Follow.         I spent 25 minutes with the patient and more than 50% of the  time was spent in consultation regarding the above.

## 2014-04-13 NOTE — Assessment & Plan Note (Signed)
Stable

## 2014-04-13 NOTE — Assessment & Plan Note (Signed)
Found to have elevated ast/alt.  Recent check elevated - AST 57 and ALT 45.  Has adjusted her diet and is exercising.  Follow.

## 2014-04-13 NOTE — Assessment & Plan Note (Signed)
Saw Dr Kerrie PleasureMoryati.  Was placed on Armour Thyroid.  Has been stable on this regimen.  Follow.  Follow tsh.

## 2014-04-13 NOTE — Assessment & Plan Note (Signed)
Discussed diet and exercise.  Follow.  

## 2014-04-13 NOTE — Assessment & Plan Note (Signed)
Has decreased her estrogen to 1/2 dose daily.  Follow.  Plan to eventually taper off in the future.  She is tolerating.  Follow.

## 2014-04-13 NOTE — Assessment & Plan Note (Signed)
Saw Dr Lemar LivingsByrnett.  S/p biopsy.  Benign.  Due to f/u with Dr Lemar LivingsByrnett and f/u mammogram in 07/2014.

## 2014-04-15 ENCOUNTER — Telehealth: Payer: Self-pay | Admitting: *Deleted

## 2014-04-15 LAB — HEPATIC FUNCTION PANEL
ALBUMIN: 3.5 g/dL (ref 3.5–5.2)
ALK PHOS: 54 U/L (ref 39–117)
ALT: 37 U/L — AB (ref 0–35)
AST: 41 U/L — ABNORMAL HIGH (ref 0–37)
Bilirubin, Direct: 0.1 mg/dL (ref 0.0–0.3)
TOTAL PROTEIN: 7.1 g/dL (ref 6.0–8.3)
Total Bilirubin: 0.7 mg/dL (ref 0.2–1.2)

## 2014-04-15 NOTE — Telephone Encounter (Signed)
Blood sample from 10.16.2015

## 2014-04-15 NOTE — Telephone Encounter (Signed)
Ok.  Let me know if I need to do anything.   

## 2014-04-15 NOTE — Telephone Encounter (Signed)
FYI  [facility] called pt blood hemolyzed and they need a re-draw, called pt she said she is coming now

## 2014-04-16 ENCOUNTER — Telehealth: Payer: Self-pay | Admitting: Internal Medicine

## 2014-04-16 ENCOUNTER — Encounter: Payer: Self-pay | Admitting: Internal Medicine

## 2014-04-16 DIAGNOSIS — R945 Abnormal results of liver function studies: Secondary | ICD-10-CM

## 2014-04-16 DIAGNOSIS — R7989 Other specified abnormal findings of blood chemistry: Secondary | ICD-10-CM

## 2014-04-16 NOTE — Telephone Encounter (Signed)
Pt notified of lab results via my chart.  Needs a f/u non fasting lab in 6-8 weeks.  Please schedule and contact her with a lab appt date and time.  Thanks.    Dr Lorin PicketScott

## 2014-04-22 ENCOUNTER — Encounter: Payer: Self-pay | Admitting: Internal Medicine

## 2014-04-29 ENCOUNTER — Encounter: Payer: Self-pay | Admitting: Internal Medicine

## 2014-05-09 ENCOUNTER — Encounter: Payer: Self-pay | Admitting: Internal Medicine

## 2014-05-28 ENCOUNTER — Other Ambulatory Visit (INDEPENDENT_AMBULATORY_CARE_PROVIDER_SITE_OTHER): Payer: BC Managed Care – PPO

## 2014-05-28 DIAGNOSIS — R945 Abnormal results of liver function studies: Secondary | ICD-10-CM

## 2014-05-28 DIAGNOSIS — E7800 Pure hypercholesterolemia, unspecified: Secondary | ICD-10-CM

## 2014-05-28 DIAGNOSIS — R739 Hyperglycemia, unspecified: Secondary | ICD-10-CM

## 2014-05-28 DIAGNOSIS — R7989 Other specified abnormal findings of blood chemistry: Secondary | ICD-10-CM

## 2014-05-28 LAB — HEMOGLOBIN A1C: Hgb A1c MFr Bld: 6 % (ref 4.6–6.5)

## 2014-05-28 NOTE — Addendum Note (Signed)
Addended by: Cristela BluePULLIAM, Chukwuemeka Artola W on: 05/28/2014 09:08 AM   Modules accepted: Orders

## 2014-05-29 LAB — BASIC METABOLIC PANEL
BUN: 15 mg/dL (ref 6–23)
CO2: 28 meq/L (ref 19–32)
Calcium: 9.1 mg/dL (ref 8.4–10.5)
Chloride: 102 mEq/L (ref 96–112)
Creatinine, Ser: 0.7 mg/dL (ref 0.4–1.2)
GFR: 100.87 mL/min (ref >60.00)
Glucose, Bld: 95 mg/dL (ref 70–99)
POTASSIUM: 4.1 meq/L (ref 3.5–5.1)
SODIUM: 135 meq/L (ref 135–145)

## 2014-05-29 LAB — HEPATIC FUNCTION PANEL
ALT: 37 U/L — ABNORMAL HIGH (ref 0–35)
AST: 39 U/L — ABNORMAL HIGH (ref 0–37)
Albumin: 4.1 g/dL (ref 3.5–5.2)
Alkaline Phosphatase: 54 U/L (ref 39–117)
Bilirubin, Direct: 0.1 mg/dL (ref 0.0–0.3)
Total Bilirubin: 0.6 mg/dL (ref 0.2–1.2)
Total Protein: 6.9 g/dL (ref 6.0–8.3)

## 2014-05-29 LAB — LIPID PANEL
Cholesterol: 201 mg/dL — ABNORMAL HIGH (ref 0–200)
HDL: 41.9 mg/dL (ref >39.00)
NONHDL: 159.1
Total CHOL/HDL Ratio: 5
Triglycerides: 236 mg/dL — ABNORMAL HIGH (ref 0.0–149.0)
VLDL: 47.2 mg/dL — AB (ref 0.0–40.0)

## 2014-05-30 ENCOUNTER — Encounter: Payer: Self-pay | Admitting: Internal Medicine

## 2014-05-30 LAB — LDL CHOLESTEROL, DIRECT: Direct LDL: 119.6 mg/dL

## 2014-07-03 NOTE — Telephone Encounter (Signed)
noted 

## 2014-07-06 ENCOUNTER — Telehealth: Payer: Self-pay | Admitting: Nurse Practitioner

## 2014-07-06 DIAGNOSIS — J209 Acute bronchitis, unspecified: Secondary | ICD-10-CM

## 2014-07-06 MED ORDER — AZITHROMYCIN 250 MG PO TABS: ORAL_TABLET | ORAL | Status: DC

## 2014-07-06 MED ORDER — BENZONATATE 100 MG PO CAPS: 100.0000 mg | ORAL_CAPSULE | Freq: Two times a day (BID) | ORAL | Status: DC | PRN

## 2014-07-06 NOTE — Progress Notes (Signed)
We are sorry that you are not feeling well.  Here is how we plan to help!  Based on what you have shared with me it looks like you have upper respiratory tract inflammation that has resulted in a signification cough.  Inflammation and infection in the upper respiratory tract is commonly called bronchitis and has four common causes:  Allergies, Viral Infections, Acid Reflux and Bacterial Infections.  Allergies, viruses and acid reflux are treated by controlling symptoms or eliminating the cause. An example might be a cough caused by taking certain blood pressure medications. You stop the cough by changing the medication. Another example might be a cough caused by acid reflux. Controlling the reflux helps control the cough.  Based on your presentation I believe you most likely have A cough due to bacteria.  When patients have a fever and a productive cough with a change in color or increased sputum production, we are concerned about bacterial bronchitis.  If left untreated it can progress to pneumonia.  If your symptoms do not improve with your treatment plan it is important that you contact your provider.   I have prescribed Azithromyin 250 mg: two tables now and then one tablet daily for 4 additonal days   In addition you may use A prescription cough medication called Tessalon Perles 100mg. You may take 1-2 capsules every 8 hours as needed for your cough.    HOME CARE . Only take medications as instructed by your medical team. . Complete the entire course of an antibiotic. . Drink plenty of fluids and get plenty of rest. . Avoid close contacts especially the very young and the elderly . Cover your mouth if you cough or cough into your sleeve. . Always remember to wash your hands . A steam or ultrasonic humidifier can help congestion.    GET HELP RIGHT AWAY IF: . You develop worsening fever. . You become short of breath . You cough up blood. . Your symptoms persist after you have completed your  treatment plan MAKE SURE YOU   Understand these instructions.  Will watch your condition.  Will get help right away if you are not doing well or get worse.  Your e-visit answers were reviewed by a board certified advanced clinical practitioner to complete your personal care plan.  Depending on the condition, your plan could have included both over the counter or prescription medications.  If there is a problem please reply  once you have received a response from your provider.  Your safety is important to us.  If you have drug allergies check your prescription carefully.    You can use MyChart to ask questions about today's visit, request a non-urgent call back, or ask for a work or school excuse.  You will get an e-mail in the next two days asking about your experience.  I hope that your e-visit has been valuable and will speed your recovery. Thank you for using e-visits.   

## 2014-08-05 ENCOUNTER — Encounter: Payer: Self-pay | Admitting: General Surgery

## 2014-08-05 ENCOUNTER — Ambulatory Visit: Payer: Self-pay | Admitting: General Surgery

## 2014-08-12 ENCOUNTER — Encounter: Payer: Self-pay | Admitting: General Surgery

## 2014-08-12 ENCOUNTER — Other Ambulatory Visit: Payer: BC Managed Care – PPO

## 2014-08-12 ENCOUNTER — Ambulatory Visit (INDEPENDENT_AMBULATORY_CARE_PROVIDER_SITE_OTHER): Payer: BLUE CROSS/BLUE SHIELD | Admitting: General Surgery

## 2014-08-12 VITALS — BP 132/76 | HR 74 | Resp 14 | Ht 66.0 in | Wt 193.0 lb

## 2014-08-12 DIAGNOSIS — N6489 Other specified disorders of breast: Secondary | ICD-10-CM

## 2014-08-12 DIAGNOSIS — N62 Hypertrophy of breast: Secondary | ICD-10-CM

## 2014-08-12 DIAGNOSIS — R928 Other abnormal and inconclusive findings on diagnostic imaging of breast: Secondary | ICD-10-CM

## 2014-08-12 NOTE — Progress Notes (Signed)
Patient ID: Carol Jimenez, female   DOB: 01/19/1961, 54 y.o.   MRN: [id number]  Chief Complaint  Patient presents with  . Follow-up    mammogram    HPI Carol Jimenez is a 54 y.o. female who presents for a breast evaluation. The most recent left breast mammogram was done on 08/05/14 .  Patient does perform regular self breast checks and gets regular mammograms done.    HPI  Past Medical History  Diagnosis Date  . Allergy   . Hypothyroidism   . Hx: UTI (urinary tract infection)     Past Surgical History  Procedure Laterality Date  . Cesarean section    . Endometrial ablation  2000    Family History  Problem Relation Age of Onset  . Congestive Heart Failure Mother   . Diabetes Mother   . Hypertension Father   . Colon polyps    . Esophageal cancer Father 3160    [facility]  . COPD Mother     Social History History  Substance Use Topics  . Smoking status: Never Smoker   . Smokeless tobacco: Never Used  . Alcohol Use: Yes     Comment: rare    Allergies  Allergen Reactions  . Sulfa Antibiotics Rash    Current Outpatient Prescriptions  Medication Sig Dispense Refill  . azithromycin (ZITHROMAX Z-PAK) 250 MG tablet As directed 6 each 0  . benzonatate (TESSALON) 100 MG capsule Take 1 capsule (100 mg total) by mouth 2 (two) times daily as needed for cough. 20 capsule 0  . estradiol (ESTRACE) 0.5 MG tablet Take 1 tablet (0.5 mg total) by mouth daily. 30 tablet 3  . hydrochlorothiazide (HYDRODIURIL) 25 MG tablet Take 25 mg by mouth daily.    . meloxicam (MOBIC) 15 MG tablet Take 15 mg by mouth daily.    . montelukast (SINGULAIR) 10 MG tablet Take 10 mg by mouth at bedtime.    . progesterone (PROMETRIUM) 100 MG capsule Take 100 mg by mouth daily.    Marland Kitchen. scopolamine (TRANSDERM-SCOP) 1 MG/3DAYS Place 1 patch (1.5 mg total) onto the skin every 3 (three) days. 5 patch 0  . thyroid (ARMOUR) 90 MG tablet Take 90 mg by mouth daily.     No current facility-administered  medications for this visit.    Review of Systems Review of Systems  Constitutional: Negative.   Respiratory: Negative.   Cardiovascular: Negative.     Blood pressure 132/76, pulse 74, resp. rate 14, height 5\' 6"  (1.676 m), weight 193 lb (87.544 kg), last menstrual period 12/26/2012.  Physical Exam Physical Exam  Constitutional: She is oriented to person, place, and time. She appears well-developed and well-nourished.  Eyes: Conjunctivae are normal. No scleral icterus.  Neck: Neck supple.  Cardiovascular: Normal rate, regular rhythm and normal heart sounds.   Pulmonary/Chest: Effort normal and breath sounds normal. Right breast exhibits no inverted nipple, no mass, no nipple discharge, no skin change and no tenderness. Left breast exhibits no inverted nipple, no mass, no nipple discharge, no skin change and no tenderness.  Abdominal: Soft. Bowel sounds are normal. There is no tenderness.  Lymphadenopathy:    She has no cervical adenopathy.    She has no axillary adenopathy.  Neurological: She is alert and oriented to person, place, and time.  Skin: Skin is warm and dry.    Data Reviewed 01/30/2014 left breast biopsy:  LEFT BREAST 6:00: - PSEUDOANGIOMATOUS STROMAL HYPERPLASIA, SCLEROSING ADENOSIS AND MICROCYST FORMATION. - NEGATIVE FOR ATYPIA  AND MALIGNANCY. NOTE: The results of this case were discussed with Dr. Lemar Livings on January 31, 2014 by Dr. Excell Seltzer. XDB/01/31/2014  Left breast mammogram dated 08/05/2014 identifies a metallic clip at the site of the previously noted lesion in the left central breast. BI-RADS-2.  Assessment    Benign breast exam.     Plan    Patient will be asked to return as needed. She should resume annual screening mammograms in fall 2016 with Dr. Lorin Picket.       PCP:  Letta Median 08/12/2014, 12:05 PM

## 2014-08-12 NOTE — Patient Instructions (Signed)
Patient to return as needed. 

## 2014-08-13 ENCOUNTER — Ambulatory Visit: Payer: BC Managed Care – PPO | Admitting: Internal Medicine

## 2014-09-05 ENCOUNTER — Encounter: Payer: Self-pay | Admitting: Internal Medicine

## 2014-10-02 ENCOUNTER — Other Ambulatory Visit: Payer: Self-pay | Admitting: *Deleted

## 2014-10-02 MED ORDER — PROGESTERONE MICRONIZED 100 MG PO CAPS: 100.0000 mg | ORAL_CAPSULE | Freq: Every day | ORAL | Status: DC

## 2014-10-02 MED ORDER — HYDROCHLOROTHIAZIDE 25 MG PO TABS: 25.0000 mg | ORAL_TABLET | Freq: Every day | ORAL | Status: DC

## 2014-10-02 MED ORDER — THYROID 90 MG PO TABS: 90.0000 mg | ORAL_TABLET | Freq: Every day | ORAL | Status: DC

## 2014-10-02 NOTE — Addendum Note (Signed)
Addended by: Chandra BatchNIXON, Zarai Orsborn E on: 10/02/2014 03:14 PM   Modules accepted: Orders

## 2014-10-02 NOTE — Telephone Encounter (Signed)
Refill? Not Rx'd by you previously

## 2014-10-04 ENCOUNTER — Other Ambulatory Visit: Payer: Self-pay

## 2014-10-08 ENCOUNTER — Ambulatory Visit: Payer: Self-pay | Admitting: Internal Medicine

## 2014-10-30 ENCOUNTER — Other Ambulatory Visit (INDEPENDENT_AMBULATORY_CARE_PROVIDER_SITE_OTHER): Payer: BLUE CROSS/BLUE SHIELD

## 2014-10-30 ENCOUNTER — Telehealth: Payer: Self-pay | Admitting: *Deleted

## 2014-10-30 DIAGNOSIS — R739 Hyperglycemia, unspecified: Secondary | ICD-10-CM

## 2014-10-30 DIAGNOSIS — R945 Abnormal results of liver function studies: Secondary | ICD-10-CM

## 2014-10-30 DIAGNOSIS — R7989 Other specified abnormal findings of blood chemistry: Secondary | ICD-10-CM

## 2014-10-30 DIAGNOSIS — E7800 Pure hypercholesterolemia, unspecified: Secondary | ICD-10-CM

## 2014-10-30 NOTE — Telephone Encounter (Signed)
Labs and dx?  

## 2014-10-30 NOTE — Telephone Encounter (Signed)
Order placed for labs.

## 2014-10-31 LAB — HEPATIC FUNCTION PANEL
ALBUMIN: 4 g/dL (ref 3.5–5.2)
ALK PHOS: 60 U/L (ref 39–117)
ALT: 23 U/L (ref 0–35)
AST: 24 U/L (ref 0–37)
Bilirubin, Direct: 0.1 mg/dL (ref 0.0–0.3)
TOTAL PROTEIN: 7.2 g/dL (ref 6.0–8.3)
Total Bilirubin: 0.6 mg/dL (ref 0.2–1.2)

## 2014-10-31 LAB — LIPID PANEL
Cholesterol: 199 mg/dL (ref 0–200)
HDL: 51.9 mg/dL (ref >39.00)
NONHDL: 147.1
TRIGLYCERIDES: 228 mg/dL — AB (ref 0.0–149.0)
Total CHOL/HDL Ratio: 4
VLDL: 45.6 mg/dL — ABNORMAL HIGH (ref 0.0–40.0)

## 2014-10-31 LAB — BASIC METABOLIC PANEL
BUN: 13 mg/dL (ref 6–23)
CO2: 30 mEq/L (ref 19–32)
Calcium: 9.4 mg/dL (ref 8.4–10.5)
Chloride: 100 mEq/L (ref 96–112)
Creatinine, Ser: 0.72 mg/dL (ref 0.40–1.20)
GFR: 89.5 mL/min (ref >60.00)
Glucose, Bld: 97 mg/dL (ref 70–99)
Potassium: 4.2 mEq/L (ref 3.5–5.1)
Sodium: 138 mEq/L (ref 135–145)

## 2014-10-31 LAB — LDL CHOLESTEROL, DIRECT: LDL DIRECT: 124 mg/dL

## 2014-10-31 LAB — HEMOGLOBIN A1C: Hgb A1c MFr Bld: 5.6 % (ref 4.6–6.5)

## 2014-11-01 ENCOUNTER — Encounter: Payer: Self-pay | Admitting: Internal Medicine

## 2014-11-01 ENCOUNTER — Ambulatory Visit: Payer: Self-pay | Admitting: Internal Medicine

## 2014-11-05 ENCOUNTER — Other Ambulatory Visit: Payer: Self-pay | Admitting: *Deleted

## 2014-11-05 MED ORDER — ESTRADIOL 0.5 MG PO TABS: 0.5000 mg | ORAL_TABLET | Freq: Every day | ORAL | Status: DC

## 2014-11-19 ENCOUNTER — Ambulatory Visit: Payer: Self-pay | Admitting: Internal Medicine

## 2014-12-23 ENCOUNTER — Encounter: Payer: Self-pay | Admitting: Internal Medicine

## 2014-12-23 ENCOUNTER — Ambulatory Visit
Admission: RE | Admit: 2014-12-23 | Discharge: 2014-12-23 | Disposition: A | Discharge status: Home / Self Care | Payer: BLUE CROSS/BLUE SHIELD | Source: Ambulatory Visit | Attending: Internal Medicine | Admitting: Internal Medicine

## 2014-12-23 ENCOUNTER — Ambulatory Visit (INDEPENDENT_AMBULATORY_CARE_PROVIDER_SITE_OTHER): Payer: BLUE CROSS/BLUE SHIELD | Admitting: Internal Medicine

## 2014-12-23 VITALS — BP 124/82 | HR 70 | Temp 98.2°F | Ht 66.0 in | Wt 197.0 lb

## 2014-12-23 DIAGNOSIS — R945 Abnormal results of liver function studies: Secondary | ICD-10-CM

## 2014-12-23 DIAGNOSIS — E7800 Pure hypercholesterolemia, unspecified: Secondary | ICD-10-CM

## 2014-12-23 DIAGNOSIS — Z0000 Encounter for general adult medical examination without abnormal findings: Secondary | ICD-10-CM

## 2014-12-23 DIAGNOSIS — R079 Chest pain, unspecified: Secondary | ICD-10-CM | POA: Diagnosis present

## 2014-12-23 DIAGNOSIS — N951 Menopausal and female climacteric states: Secondary | ICD-10-CM | POA: Diagnosis not present

## 2014-12-23 DIAGNOSIS — G4710 Hypersomnia, unspecified: Secondary | ICD-10-CM

## 2014-12-23 DIAGNOSIS — R0781 Pleurodynia: Secondary | ICD-10-CM

## 2014-12-23 DIAGNOSIS — R5383 Other fatigue: Secondary | ICD-10-CM

## 2014-12-23 DIAGNOSIS — Z1239 Encounter for other screening for malignant neoplasm of breast: Secondary | ICD-10-CM

## 2014-12-23 DIAGNOSIS — Z8639 Personal history of other endocrine, nutritional and metabolic disease: Secondary | ICD-10-CM

## 2014-12-23 DIAGNOSIS — E559 Vitamin D deficiency, unspecified: Secondary | ICD-10-CM | POA: Insufficient documentation

## 2014-12-23 DIAGNOSIS — E039 Hypothyroidism, unspecified: Secondary | ICD-10-CM

## 2014-12-23 DIAGNOSIS — R7989 Other specified abnormal findings of blood chemistry: Secondary | ICD-10-CM

## 2014-12-23 DIAGNOSIS — E669 Obesity, unspecified: Secondary | ICD-10-CM

## 2014-12-23 DIAGNOSIS — R400 Somnolence: Secondary | ICD-10-CM

## 2014-12-23 LAB — VITAMIN D 25 HYDROXY (VIT D DEFICIENCY, FRACTURES): VITD: 30.62 ng/mL (ref 30.00–100.00)

## 2014-12-23 LAB — VITAMIN B12: Vitamin B-12: 210 pg/mL — ABNORMAL LOW (ref 211–911)

## 2014-12-23 MED ORDER — ESTRADIOL 0.5 MG PO TABS: ORAL_TABLET | ORAL | Status: DC

## 2014-12-23 NOTE — Progress Notes (Signed)
Patient ID: Carol Jimenez, female   DOB: 03/02/1961, 54 y.o.   MRN: [id number]   Subjective:    Patient ID: Carol Jimenez, female    DOB: 12/23/1960, 54 y.o.   MRN: [id number]  HPI  Patient here for a scheduled follow up.  She is having problems with some hot flashes.  Has noticed some mood change.  Not sleeping as well.  Has decreased her estrogen.  Discussed going back up some on the dose.  Discussed other treatment options.  She also wakes herself up at night.  Snores.  She cannot lie on her back.  Husband has witnessed some apnea episodes.  Reports some fatigue.  No cardiac symptoms with increased activity or exertion.  Discussed diet and exercise.  Bowels stable.  Reports a history of low B12 and low vitaminD.     Past Medical History  Diagnosis Date  . Allergy   . Hypothyroidism   . Hx: UTI (urinary tract infection)     Current Outpatient Prescriptions on File Prior to Visit  Medication Sig Dispense Refill  . hydrochlorothiazide (HYDRODIURIL) 25 MG tablet Take 1 tablet (25 mg total) by mouth daily. 90 tablet 1  . meloxicam (MOBIC) 15 MG tablet Take 15 mg by mouth daily.    . montelukast (SINGULAIR) 10 MG tablet Take 10 mg by mouth at bedtime.    . progesterone (PROMETRIUM) 100 MG capsule Take 1 capsule (100 mg total) by mouth daily. 30 capsule 1  . thyroid (ARMOUR) 90 MG tablet Take 1 tablet (90 mg total) by mouth daily. 90 tablet 1   No current facility-administered medications on file prior to visit.    Review of Systems  Constitutional: Positive for fatigue. Negative for appetite change and unexpected weight change.  HENT: Negative for congestion and sinus pressure.   Respiratory: Negative for cough, chest tightness and shortness of breath.   Cardiovascular: Negative for chest pain, palpitations and leg swelling.  Gastrointestinal: Negative for nausea, vomiting, abdominal pain and diarrhea.  Musculoskeletal: Negative for back pain and joint swelling.  Skin: Negative  for color change and rash.  Neurological: Negative for dizziness, light-headedness and headaches.  Psychiatric/Behavioral: Positive for sleep disturbance (not sleeping well.  snoring.  ). Negative for dysphoric mood and agitation.       Has noticed some mood changes.         Objective:    Physical Exam  Constitutional: She appears well-developed and well-nourished. No distress.  HENT:  Nose: Nose normal.  Mouth/Throat: Oropharynx is clear and moist.  Neck: Neck supple. No thyromegaly present.  Cardiovascular: Normal rate and regular rhythm.   Pulmonary/Chest: Breath sounds normal. No respiratory distress. She has no wheezes.  Abdominal: Soft. Bowel sounds are normal. There is no tenderness.  Musculoskeletal: She exhibits no edema or tenderness.  Lymphadenopathy:    She has no cervical adenopathy.  Skin: No rash noted. No erythema.  Psychiatric: She has a normal mood and affect. Her behavior is normal.    BP 124/82 mmHg  Pulse 70  Temp(Src) 98.2 F (36.8 C) (Oral)  Ht 5\' 6"  (1.676 m)  Wt 197 lb (89.359 kg)  BMI 31.81 kg/m2  SpO2 97%  LMP 12/26/2012 Wt Readings from Last 3 Encounters:  12/23/14 197 lb (89.359 kg)  08/12/14 193 lb (87.544 kg)  04/12/14 196 lb 8 oz (89.132 kg)     Lab Results  Component Value Date   WBC 6.8 02/19/2014   HGB 14.4 02/19/2014   HCT 41.7  02/19/2014   PLT 217.0 02/19/2014   GLUCOSE 97 10/30/2014   CHOL 199 10/30/2014   TRIG 228.0* 10/30/2014   HDL 51.90 10/30/2014   LDLDIRECT 124.0 10/30/2014   ALT 23 10/30/2014   AST 24 10/30/2014   NA 138 10/30/2014   K 4.2 10/30/2014   CL 100 10/30/2014   CREATININE 0.72 10/30/2014   BUN 13 10/30/2014   CO2 30 10/30/2014   TSH 1.60 02/19/2014   HGBA1C 5.6 10/30/2014       Assessment & Plan:   Problem List Items Addressed This Visit    Abnormal liver function tests    Recent liver function tests wnl.  Follow.        Daytime somnolence    Notices snoring, unable to lie flat on her  back, witness apnea episodes, fatigue, etc.  Will schedule split night sleep study.        Relevant Orders   Ambulatory referral to Sleep Studies   Fatigue    Check B12 and vitamin d level.  Check split night sleep study.        Relevant Orders   Ambulatory referral to Sleep Studies   Health care maintenance    Physical 02/12/14.  Colonoscopy 2012.  Mammogram screening 12/2013.  Had f/u in 07/2014.  Schedule f/u mammogram.        History of non anemic vitamin B12 deficiency    Check B12 level.        Relevant Orders   Vitamin B12 (Completed)   Hypercholesterolemia    Low cholesterol diet and exercise.  Follow lipid panel.   Lab Results  Component Value Date   CHOL 199 10/30/2014   HDL 51.90 10/30/2014   LDLDIRECT 124.0 10/30/2014   TRIG 228.0* 10/30/2014   CHOLHDL 4 10/30/2014         Hypothyroid    On armour thyroid.  Follow tsh.        Menopausal symptoms - Primary    Discussed treatment options with her.  Discussed adjusting the dose of estrogen.  She will increase slightly.  Follow.        Obesity (BMI 30.0-34.9)    Diet and exercise.        Rib pain on right side    Persistent.  Check xray.  Further w/up pending.        Relevant Orders   DG Ribs Unilateral Right (Completed)   Vitamin D deficiency    Check vitamin D level.        Relevant Orders   Vit D  25 hydroxy (rtn osteoporosis monitoring) (Completed)    Other Visit Diagnoses    Breast cancer screening        Relevant Orders    MM DIGITAL SCREENING BILATERAL      I spent 25 minutes with the patient and more than 50% of the time was spent in consultation regarding the above.     [place], MD

## 2014-12-23 NOTE — Progress Notes (Signed)
Pre visit review using our clinic review tool, if applicable. No additional management support is needed unless otherwise documented below in the visit note. 

## 2014-12-24 ENCOUNTER — Encounter: Payer: Self-pay | Admitting: Internal Medicine

## 2014-12-24 ENCOUNTER — Encounter: Payer: Self-pay | Admitting: *Deleted

## 2014-12-24 DIAGNOSIS — R0781 Pleurodynia: Secondary | ICD-10-CM

## 2014-12-24 DIAGNOSIS — Z0000 Encounter for general adult medical examination without abnormal findings: Secondary | ICD-10-CM | POA: Insufficient documentation

## 2014-12-24 NOTE — Assessment & Plan Note (Signed)
Notices snoring, unable to lie flat on her back, witness apnea episodes, fatigue, etc.  Will schedule split night sleep study.

## 2014-12-24 NOTE — Assessment & Plan Note (Signed)
Check vitamin D level 

## 2014-12-24 NOTE — Assessment & Plan Note (Signed)
Diet and exercise.   

## 2014-12-24 NOTE — Assessment & Plan Note (Signed)
Physical 02/12/14.  Colonoscopy 2012.  Mammogram screening 12/2013.  Had f/u in 07/2014.  Schedule f/u mammogram.

## 2014-12-24 NOTE — Assessment & Plan Note (Signed)
Discussed treatment options with her.  Discussed adjusting the dose of estrogen.  She will increase slightly.  Follow.

## 2014-12-24 NOTE — Assessment & Plan Note (Signed)
Persistent.  Check xray.  Further w/up pending.

## 2014-12-24 NOTE — Assessment & Plan Note (Signed)
Recent liver function tests wnl.  Follow.

## 2014-12-24 NOTE — Telephone Encounter (Signed)
See mychart, pt agreeable to CT, just out of town next week and will be back July 11

## 2014-12-24 NOTE — Assessment & Plan Note (Signed)
Check B12 and vitamin d level.  Check split night sleep study.

## 2014-12-24 NOTE — Assessment & Plan Note (Signed)
Check B12 level. 

## 2014-12-24 NOTE — Assessment & Plan Note (Signed)
On armour thyroid.  Follow tsh.

## 2014-12-24 NOTE — Telephone Encounter (Signed)
I have ordered the CT chest.  Please notify pt that we will need a blood test to check her kidney function prior to her scan.  Please schedule her to come in for non fasting lab.

## 2014-12-24 NOTE — Assessment & Plan Note (Signed)
Low cholesterol diet and exercise.  Follow lipid panel.   Lab Results  Component Value Date   CHOL 199 10/30/2014   HDL 51.90 10/30/2014   LDLDIRECT 124.0 10/30/2014   TRIG 228.0* 10/30/2014   CHOLHDL 4 10/30/2014

## 2014-12-25 ENCOUNTER — Other Ambulatory Visit: Payer: Self-pay | Admitting: *Deleted

## 2014-12-25 ENCOUNTER — Ambulatory Visit (INDEPENDENT_AMBULATORY_CARE_PROVIDER_SITE_OTHER): Payer: BLUE CROSS/BLUE SHIELD | Admitting: *Deleted

## 2014-12-25 ENCOUNTER — Other Ambulatory Visit (INDEPENDENT_AMBULATORY_CARE_PROVIDER_SITE_OTHER): Payer: BLUE CROSS/BLUE SHIELD

## 2014-12-25 ENCOUNTER — Encounter: Payer: Self-pay | Admitting: Internal Medicine

## 2014-12-25 DIAGNOSIS — E538 Deficiency of other specified B group vitamins: Secondary | ICD-10-CM | POA: Diagnosis not present

## 2014-12-25 DIAGNOSIS — R0781 Pleurodynia: Secondary | ICD-10-CM | POA: Diagnosis not present

## 2014-12-25 LAB — CREATININE, SERUM: CREATININE: 0.7 mg/dL (ref 0.40–1.20)

## 2014-12-25 MED ORDER — CYANOCOBALAMIN 1000 MCG/ML IJ SOLN
1000.0000 ug | Freq: Once | INTRAMUSCULAR | Status: AC
  Administered 2014-12-25: 1000 ug via INTRAMUSCULAR

## 2014-12-25 MED ORDER — PROGESTERONE MICRONIZED 100 MG PO CAPS: 100.0000 mg | ORAL_CAPSULE | Freq: Every day | ORAL | Status: DC

## 2015-01-07 ENCOUNTER — Other Ambulatory Visit: Payer: Self-pay | Admitting: Internal Medicine

## 2015-01-07 DIAGNOSIS — R9389 Abnormal findings on diagnostic imaging of other specified body structures: Secondary | ICD-10-CM

## 2015-01-07 NOTE — Progress Notes (Signed)
Order placed for CT chest without contrast.  

## 2015-01-08 ENCOUNTER — Ambulatory Visit (INDEPENDENT_AMBULATORY_CARE_PROVIDER_SITE_OTHER): Payer: BLUE CROSS/BLUE SHIELD | Admitting: *Deleted

## 2015-01-08 ENCOUNTER — Encounter: Payer: Self-pay | Admitting: Internal Medicine

## 2015-01-08 DIAGNOSIS — E538 Deficiency of other specified B group vitamins: Secondary | ICD-10-CM

## 2015-01-08 MED ORDER — CYANOCOBALAMIN 1000 MCG/ML IJ SOLN
1000.0000 ug | Freq: Once | INTRAMUSCULAR | Status: AC
  Administered 2015-01-08: 1000 ug via INTRAMUSCULAR

## 2015-01-13 ENCOUNTER — Ambulatory Visit
Admission: RE | Admit: 2015-01-13 | Discharge: 2015-01-13 | Disposition: A | Discharge status: Home / Self Care | Payer: BLUE CROSS/BLUE SHIELD | Source: Ambulatory Visit | Attending: Internal Medicine | Admitting: Internal Medicine

## 2015-01-13 ENCOUNTER — Encounter: Payer: Self-pay | Admitting: Internal Medicine

## 2015-01-13 DIAGNOSIS — R9389 Abnormal findings on diagnostic imaging of other specified body structures: Secondary | ICD-10-CM

## 2015-01-13 DIAGNOSIS — D71 Functional disorders of polymorphonuclear neutrophils: Secondary | ICD-10-CM | POA: Insufficient documentation

## 2015-01-13 DIAGNOSIS — R938 Abnormal findings on diagnostic imaging of other specified body structures: Secondary | ICD-10-CM | POA: Diagnosis present

## 2015-01-15 ENCOUNTER — Ambulatory Visit (INDEPENDENT_AMBULATORY_CARE_PROVIDER_SITE_OTHER): Payer: BLUE CROSS/BLUE SHIELD | Admitting: *Deleted

## 2015-01-15 ENCOUNTER — Ambulatory Visit: Payer: BLUE CROSS/BLUE SHIELD

## 2015-01-15 DIAGNOSIS — E538 Deficiency of other specified B group vitamins: Secondary | ICD-10-CM | POA: Diagnosis not present

## 2015-01-15 MED ORDER — CYANOCOBALAMIN 1000 MCG/ML IJ SOLN
1000.0000 ug | Freq: Once | INTRAMUSCULAR | Status: AC
  Administered 2015-01-15: 1000 ug via INTRAMUSCULAR

## 2015-01-22 ENCOUNTER — Ambulatory Visit: Payer: BLUE CROSS/BLUE SHIELD

## 2015-01-22 ENCOUNTER — Ambulatory Visit (INDEPENDENT_AMBULATORY_CARE_PROVIDER_SITE_OTHER): Payer: BLUE CROSS/BLUE SHIELD

## 2015-01-22 DIAGNOSIS — E538 Deficiency of other specified B group vitamins: Secondary | ICD-10-CM

## 2015-01-22 MED ORDER — CYANOCOBALAMIN 1000 MCG/ML IJ SOLN
1000.0000 ug | Freq: Once | INTRAMUSCULAR | Status: AC
  Administered 2015-01-22: 1000 ug via INTRAMUSCULAR

## 2015-01-22 NOTE — Progress Notes (Signed)
Patient came in for b12 injection.  Received in Left arm.  Patient tolerated well.  

## 2015-01-29 ENCOUNTER — Ambulatory Visit (INDEPENDENT_AMBULATORY_CARE_PROVIDER_SITE_OTHER): Payer: BLUE CROSS/BLUE SHIELD

## 2015-01-29 ENCOUNTER — Ambulatory Visit: Payer: BLUE CROSS/BLUE SHIELD

## 2015-01-29 DIAGNOSIS — E538 Deficiency of other specified B group vitamins: Secondary | ICD-10-CM | POA: Diagnosis not present

## 2015-01-29 MED ORDER — CYANOCOBALAMIN 1000 MCG/ML IJ SOLN
1000.0000 ug | Freq: Once | INTRAMUSCULAR | Status: AC
  Administered 2015-01-29: 1000 ug via INTRAMUSCULAR

## 2015-01-29 NOTE — Progress Notes (Signed)
Patient came in for last of her weekly B12 injections.  Will start monthly injections in September.  Received in her Right Deltoid.  Patient tolerated well.

## 2015-02-18 ENCOUNTER — Ambulatory Visit: Payer: BLUE CROSS/BLUE SHIELD

## 2015-03-05 ENCOUNTER — Ambulatory Visit (INDEPENDENT_AMBULATORY_CARE_PROVIDER_SITE_OTHER): Payer: BLUE CROSS/BLUE SHIELD

## 2015-03-05 DIAGNOSIS — E538 Deficiency of other specified B group vitamins: Secondary | ICD-10-CM | POA: Diagnosis not present

## 2015-03-05 MED ORDER — CYANOCOBALAMIN 1000 MCG/ML IJ SOLN
1000.0000 ug | Freq: Once | INTRAMUSCULAR | Status: AC
  Administered 2015-03-05: 1000 ug via INTRAMUSCULAR

## 2015-03-05 NOTE — Progress Notes (Signed)
Pt is receiving a B12 shot in her left arm.

## 2015-03-08 ENCOUNTER — Other Ambulatory Visit: Payer: Self-pay | Admitting: Internal Medicine

## 2015-03-21 ENCOUNTER — Ambulatory Visit (INDEPENDENT_AMBULATORY_CARE_PROVIDER_SITE_OTHER): Payer: BLUE CROSS/BLUE SHIELD | Admitting: Internal Medicine

## 2015-03-21 ENCOUNTER — Encounter: Payer: Self-pay | Admitting: Internal Medicine

## 2015-03-21 VITALS — BP 100/70 | HR 69 | Temp 98.6°F | Resp 18 | Ht 66.0 in | Wt 201.1 lb

## 2015-03-21 DIAGNOSIS — N951 Menopausal and female climacteric states: Secondary | ICD-10-CM

## 2015-03-21 DIAGNOSIS — Z23 Encounter for immunization: Secondary | ICD-10-CM | POA: Diagnosis not present

## 2015-03-21 DIAGNOSIS — Z0000 Encounter for general adult medical examination without abnormal findings: Secondary | ICD-10-CM

## 2015-03-21 DIAGNOSIS — Z8639 Personal history of other endocrine, nutritional and metabolic disease: Secondary | ICD-10-CM | POA: Diagnosis not present

## 2015-03-21 DIAGNOSIS — R945 Abnormal results of liver function studies: Secondary | ICD-10-CM

## 2015-03-21 DIAGNOSIS — E039 Hypothyroidism, unspecified: Secondary | ICD-10-CM | POA: Diagnosis not present

## 2015-03-21 DIAGNOSIS — E559 Vitamin D deficiency, unspecified: Secondary | ICD-10-CM

## 2015-03-21 DIAGNOSIS — E7800 Pure hypercholesterolemia, unspecified: Secondary | ICD-10-CM

## 2015-03-21 DIAGNOSIS — E669 Obesity, unspecified: Secondary | ICD-10-CM

## 2015-03-21 DIAGNOSIS — R7989 Other specified abnormal findings of blood chemistry: Secondary | ICD-10-CM | POA: Diagnosis not present

## 2015-03-21 DIAGNOSIS — E66811 Obesity, class 1: Secondary | ICD-10-CM

## 2015-03-21 DIAGNOSIS — Z9109 Other allergy status, other than to drugs and biological substances: Secondary | ICD-10-CM

## 2015-03-21 DIAGNOSIS — Z1239 Encounter for other screening for malignant neoplasm of breast: Secondary | ICD-10-CM

## 2015-03-21 DIAGNOSIS — Z658 Other specified problems related to psychosocial circumstances: Secondary | ICD-10-CM

## 2015-03-21 DIAGNOSIS — Z91048 Other nonmedicinal substance allergy status: Secondary | ICD-10-CM

## 2015-03-21 DIAGNOSIS — F439 Reaction to severe stress, unspecified: Secondary | ICD-10-CM

## 2015-03-21 MED ORDER — VENLAFAXINE HCL ER 37.5 MG PO CP24: 37.5000 mg | ORAL_CAPSULE | Freq: Every day | ORAL | Status: DC

## 2015-03-21 NOTE — Patient Instructions (Signed)

## 2015-03-21 NOTE — Progress Notes (Signed)
Patient ID: Carol Jimenez, female   DOB: 1960-08-12, 54 y.o.   MRN: [id number]   Subjective:    Patient ID: Carol Jimenez, female    DOB: 03-10-1961, 54 y.o.   MRN: [id number]  HPI  Patient with past history of abnormal liver function tests and hypercholesterolemia.  She comes in today to follow up on these issues as well as for a complete physical exam.  Her main complaint is that of hot flashes.  Persistent.  Feels hot when she wakes up.  More frequent.  No headache.  No dizziness.  Tries to stay active.  No cardiac symptoms with increased activity or exertion.  No sob.  No acid reflux reported.  No abdominal pain or cramping.  Bowels stable.  Some increased stress.  Discussed with her today.  We had discussed treatment for hot flashes.  Discussed starting effexor.  Would help with both issues.     Past Medical History  Diagnosis Date  . Allergy   . Hypothyroidism   . Hx: UTI (urinary tract infection)    Past Surgical History  Procedure Laterality Date  . Cesarean section    . Endometrial ablation  2000   Family History  Problem Relation Age of Onset  . Congestive Heart Failure Mother   . Diabetes Mother   . Hypertension Father   . Colon polyps    . Esophageal cancer Father 47    [facility] at [facility]  . COPD Mother    Social History   Social History  . Marital Status: Married    Spouse Name: N/A  . Number of Children: N/A  . Years of Education: N/A   Social History Main Topics  . Smoking status: Never Smoker   . Smokeless tobacco: Never Used  . Alcohol Use: 0.0 oz/week    0 Standard drinks or equivalent per week     Comment: rare  . Drug Use: No  . Sexual Activity: Not Asked   Other Topics Concern  . None   Social History Narrative    Outpatient Encounter Prescriptions as of 03/21/2015  Medication Sig  . estradiol (ESTRACE) 0.5 MG tablet Take 1-2 tablets q day  . hydrochlorothiazide (HYDRODIURIL) 25 MG tablet Take 1 tablet (25 mg total) by mouth daily.  .  meloxicam (MOBIC) 15 MG tablet Take 15 mg by mouth daily.  . montelukast (SINGULAIR) 10 MG tablet Take 10 mg by mouth at bedtime.  . progesterone (PROMETRIUM) 100 MG capsule take 1 capsule by mouth once daily  . thyroid (ARMOUR) 90 MG tablet Take 1 tablet (90 mg total) by mouth daily.  Marland Kitchen venlafaxine XR (EFFEXOR XR) 37.5 MG 24 hr capsule Take 1 capsule (37.5 mg total) by mouth daily with breakfast.   No facility-administered encounter medications on file as of 03/21/2015.    Review of Systems  Constitutional: Negative for appetite change and unexpected weight change.  HENT: Negative for congestion and sinus pressure.   Eyes: Negative for pain and visual disturbance.  Respiratory: Negative for cough, chest tightness and shortness of breath.   Cardiovascular: Negative for chest pain, palpitations and leg swelling.  Gastrointestinal: Negative for nausea, vomiting, abdominal pain and diarrhea.  Genitourinary: Positive for frequency. Negative for dysuria and difficulty urinating.  Musculoskeletal: Negative for back pain and joint swelling.  Skin: Negative for color change and rash.  Neurological: Negative for dizziness, light-headedness and headaches.  Hematological: Negative for adenopathy. Does not bruise/bleed easily.  Psychiatric/Behavioral: Negative for dysphoric mood and agitation.  Objective:    Physical Exam  Constitutional: She is oriented to person, place, and time. She appears well-developed and well-nourished. No distress.  HENT:  Nose: Nose normal.  Mouth/Throat: Oropharynx is clear and moist.  Eyes: Right eye exhibits no discharge. Left eye exhibits no discharge. No scleral icterus.  Neck: Neck supple. No thyromegaly present.  Cardiovascular: Normal rate and regular rhythm.   Pulmonary/Chest: Breath sounds normal. No accessory muscle usage. No tachypnea. No respiratory distress. She has no decreased breath sounds. She has no wheezes. She has no rhonchi. Right breast  exhibits no inverted nipple, no mass, no nipple discharge and no tenderness (no axillary adenopathy). Left breast exhibits no inverted nipple, no mass, no nipple discharge and no tenderness (no axilarry adenopathy).  Abdominal: Soft. Bowel sounds are normal. There is no tenderness.  Musculoskeletal: She exhibits no edema or tenderness.  Lymphadenopathy:    She has no cervical adenopathy.  Neurological: She is alert and oriented to person, place, and time.  Skin: Skin is warm. No rash noted. No erythema.  Psychiatric: She has a normal mood and affect. Her behavior is normal.    BP 100/70 mmHg  Pulse 69  Temp(Src) 98.6 F (37 C) (Oral)  Resp 18  Ht  (1.676 m)  Wt 201 lb 2 oz (91.23 kg)  BMI 32.48 kg/m2  SpO2 98%  LMP 12/26/2012 Wt Readings from Last 3 Encounters:  03/21/15 201 lb 2 oz (91.23 kg)  12/23/14 197 lb (89.359 kg)  08/12/14 193 lb (87.544 kg)     Lab Results  Component Value Date   WBC 6.8 02/19/2014   HGB 14.4 02/19/2014   HCT 41.7 02/19/2014   PLT 217.0 02/19/2014   GLUCOSE 97 10/30/2014   CHOL 199 10/30/2014   TRIG 228.0* 10/30/2014   HDL 51.90 10/30/2014   LDLDIRECT 124.0 10/30/2014   ALT 23 10/30/2014   AST 24 10/30/2014   NA 138 10/30/2014   K 4.2 10/30/2014   CL 100 10/30/2014   CREATININE 0.70 12/25/2014   BUN 13 10/30/2014   CO2 30 10/30/2014   TSH 1.60 02/19/2014   HGBA1C 5.6 10/30/2014       Assessment & Plan:   Problem List Items Addressed This Visit    Abnormal liver function tests    Last liver function tests wnl.  Recheck with next labs.        Relevant Orders   Hepatic function panel   Environmental allergies    Doing well.  Taking singulair.        Health care maintenance    Physical 03/21/15.  Colonoscopy 2012.  Schedule mammogram.        History of non anemic vitamin B12 deficiency    Continue B12 injections.       Hypercholesterolemia    Low cholesterol diet and exercise.  Follow lipid panel.        Relevant  Orders   Lipid panel   Basic metabolic panel   Hypothyroid    On thyroid replacement.  Follow tsh.        Relevant Orders   TSH   Menopausal symptoms    On estrogen.  Discussed previously desire to taper dose.  Will add effexor. See if helps with hot flashes.  If able, hopefully will be able to decrease estrogen dose in future.        Relevant Orders   CBC with Differential/Platelet   Obesity (BMI 30.0-34.9)    Diet and exercise.  Follow.  Stress    Discussed with her today.  Discussed treatment options.  Start effexor as outlined.  Get her back in soon to reassess.        Vitamin D deficiency    Continue vitamin D supplements.  Follow vitamin D level.         Other Visit Diagnoses    Encounter for immunization    -  Primary    Breast cancer screening        Relevant Orders    MM DIGITAL SCREENING BILATERAL        Dale [place], MD

## 2015-03-21 NOTE — Progress Notes (Signed)
Pre-visit discussion using our clinic review tool. No additional management support is needed unless otherwise documented below in the visit note.  

## 2015-03-23 ENCOUNTER — Encounter: Payer: Self-pay | Admitting: Internal Medicine

## 2015-03-23 DIAGNOSIS — F439 Reaction to severe stress, unspecified: Secondary | ICD-10-CM | POA: Insufficient documentation

## 2015-03-23 NOTE — Assessment & Plan Note (Signed)
Diet and exercise.  Follow.  

## 2015-03-23 NOTE — Assessment & Plan Note (Signed)
Low cholesterol diet and exercise.  Follow lipid panel.   

## 2015-03-23 NOTE — Assessment & Plan Note (Signed)
Last liver function tests wnl.  Recheck with next labs.

## 2015-03-23 NOTE — Assessment & Plan Note (Signed)
Doing well.  Taking singulair.

## 2015-03-23 NOTE — Assessment & Plan Note (Signed)
On estrogen.  Discussed previously desire to taper dose.  Will add effexor. See if helps with hot flashes.  If able, hopefully will be able to decrease estrogen dose in future.

## 2015-03-23 NOTE — Assessment & Plan Note (Signed)
Discussed with her today.  Discussed treatment options.  Start effexor as outlined.  Get her back in soon to reassess.

## 2015-03-23 NOTE — Addendum Note (Signed)
Addended by: Charm Barges on: 03/23/2015 08:36 AM   Modules accepted: Level of Service, SmartSet

## 2015-03-23 NOTE — Assessment & Plan Note (Signed)
Physical 03/21/15.  Colonoscopy 2012.  Schedule mammogram.

## 2015-03-23 NOTE — Assessment & Plan Note (Signed)
Continue B12 injections.   

## 2015-03-23 NOTE — Assessment & Plan Note (Signed)
Continue vitamin D supplements.  Follow vitamin D level.   

## 2015-03-23 NOTE — Assessment & Plan Note (Signed)
On thyroid replacement.  Follow tsh.  

## 2015-03-26 ENCOUNTER — Encounter: Payer: BLUE CROSS/BLUE SHIELD | Admitting: Internal Medicine

## 2015-03-27 ENCOUNTER — Ambulatory Visit
Admission: RE | Admit: 2015-03-27 | Discharge: 2015-03-27 | Disposition: A | Discharge status: Home / Self Care | Payer: BLUE CROSS/BLUE SHIELD | Source: Ambulatory Visit | Attending: Internal Medicine | Admitting: Internal Medicine

## 2015-03-27 DIAGNOSIS — Z1239 Encounter for other screening for malignant neoplasm of breast: Secondary | ICD-10-CM

## 2015-04-03 ENCOUNTER — Ambulatory Visit
Admission: RE | Admit: 2015-04-03 | Discharge: 2015-04-03 | Disposition: A | Discharge status: Home / Self Care | Payer: BLUE CROSS/BLUE SHIELD | Source: Ambulatory Visit | Attending: Internal Medicine | Admitting: Internal Medicine

## 2015-04-03 DIAGNOSIS — Z1231 Encounter for screening mammogram for malignant neoplasm of breast: Secondary | ICD-10-CM | POA: Insufficient documentation

## 2015-04-07 ENCOUNTER — Other Ambulatory Visit: Payer: Self-pay | Admitting: Internal Medicine

## 2015-04-08 ENCOUNTER — Ambulatory Visit (INDEPENDENT_AMBULATORY_CARE_PROVIDER_SITE_OTHER): Payer: BLUE CROSS/BLUE SHIELD

## 2015-04-08 DIAGNOSIS — E538 Deficiency of other specified B group vitamins: Secondary | ICD-10-CM | POA: Diagnosis not present

## 2015-04-08 MED ORDER — CYANOCOBALAMIN 1000 MCG/ML IJ SOLN
1000.0000 ug | Freq: Once | INTRAMUSCULAR | Status: AC
  Administered 2015-04-08: 1000 ug via INTRAMUSCULAR

## 2015-04-08 NOTE — Progress Notes (Signed)
Patient came for injection, b12.  Received in left deltoid.  Patient tolerated well.

## 2015-05-05 ENCOUNTER — Other Ambulatory Visit (INDEPENDENT_AMBULATORY_CARE_PROVIDER_SITE_OTHER): Payer: BLUE CROSS/BLUE SHIELD

## 2015-05-05 DIAGNOSIS — R7989 Other specified abnormal findings of blood chemistry: Secondary | ICD-10-CM

## 2015-05-05 DIAGNOSIS — N951 Menopausal and female climacteric states: Secondary | ICD-10-CM

## 2015-05-05 DIAGNOSIS — R945 Abnormal results of liver function studies: Secondary | ICD-10-CM

## 2015-05-05 DIAGNOSIS — E039 Hypothyroidism, unspecified: Secondary | ICD-10-CM

## 2015-05-05 DIAGNOSIS — E7800 Pure hypercholesterolemia, unspecified: Secondary | ICD-10-CM

## 2015-05-05 LAB — CBC WITH DIFFERENTIAL/PLATELET
Basophils Absolute: 0 10*3/uL (ref 0.0–0.1)
Basophils Relative: 0.4 % (ref 0.0–3.0)
EOS ABS: 0 10*3/uL (ref 0.0–0.7)
EOS PCT: 0.8 % (ref 0.0–5.0)
HEMATOCRIT: 44 % (ref 36.0–46.0)
HEMOGLOBIN: 15 g/dL (ref 12.0–15.0)
LYMPHS PCT: 29.1 % (ref 12.0–46.0)
Lymphs Abs: 1.8 10*3/uL (ref 0.7–4.0)
MCHC: 34 g/dL (ref 30.0–36.0)
MCV: 93.1 fl (ref 78.0–100.0)
MONO ABS: 0.5 10*3/uL (ref 0.1–1.0)
Monocytes Relative: 8 % (ref 3.0–12.0)
Neutro Abs: 3.8 10*3/uL (ref 1.4–7.7)
Neutrophils Relative %: 61.7 % (ref 43.0–77.0)
Platelets: 211 10*3/uL (ref 150.0–400.0)
RBC: 4.73 Mil/uL (ref 3.87–5.11)
RDW: 12.9 % (ref 11.5–15.5)
WBC: 6.1 10*3/uL (ref 4.0–10.5)

## 2015-05-05 LAB — HEPATIC FUNCTION PANEL
ALBUMIN: 4.1 g/dL (ref 3.5–5.2)
ALT: 29 U/L (ref 0–35)
AST: 28 U/L (ref 0–37)
Alkaline Phosphatase: 59 U/L (ref 39–117)
Bilirubin, Direct: 0.1 mg/dL (ref 0.0–0.3)
TOTAL PROTEIN: 7.1 g/dL (ref 6.0–8.3)
Total Bilirubin: 0.6 mg/dL (ref 0.2–1.2)

## 2015-05-05 LAB — BASIC METABOLIC PANEL
BUN: 12 mg/dL (ref 6–23)
CALCIUM: 9.7 mg/dL (ref 8.4–10.5)
CO2: 29 mEq/L (ref 19–32)
Chloride: 98 mEq/L (ref 96–112)
Creatinine, Ser: 0.72 mg/dL (ref 0.40–1.20)
GFR: 89.33 mL/min (ref >60.00)
Glucose, Bld: 110 mg/dL — ABNORMAL HIGH (ref 70–99)
POTASSIUM: 4 meq/L (ref 3.5–5.1)
SODIUM: 137 meq/L (ref 135–145)

## 2015-05-05 LAB — LIPID PANEL
CHOLESTEROL: 211 mg/dL — AB (ref 0–200)
HDL: 46.4 mg/dL (ref >39.00)
NonHDL: 164.69
TRIGLYCERIDES: 253 mg/dL — AB (ref 0.0–149.0)
Total CHOL/HDL Ratio: 5
VLDL: 50.6 mg/dL — AB (ref 0.0–40.0)

## 2015-05-05 LAB — TSH: TSH: 1.05 u[IU]/mL (ref 0.35–4.50)

## 2015-05-05 LAB — LDL CHOLESTEROL, DIRECT: LDL DIRECT: 144 mg/dL

## 2015-05-06 ENCOUNTER — Encounter: Payer: Self-pay | Admitting: Internal Medicine

## 2015-05-07 ENCOUNTER — Encounter: Payer: Self-pay | Admitting: Internal Medicine

## 2015-05-07 ENCOUNTER — Ambulatory Visit (INDEPENDENT_AMBULATORY_CARE_PROVIDER_SITE_OTHER): Payer: BLUE CROSS/BLUE SHIELD | Admitting: Internal Medicine

## 2015-05-07 ENCOUNTER — Other Ambulatory Visit: Payer: Self-pay | Admitting: Internal Medicine

## 2015-05-07 VITALS — BP 120/80 | HR 79 | Temp 98.6°F | Resp 18 | Ht 66.0 in | Wt 199.0 lb

## 2015-05-07 DIAGNOSIS — Z658 Other specified problems related to psychosocial circumstances: Secondary | ICD-10-CM | POA: Diagnosis not present

## 2015-05-07 DIAGNOSIS — N951 Menopausal and female climacteric states: Secondary | ICD-10-CM | POA: Diagnosis not present

## 2015-05-07 DIAGNOSIS — E7800 Pure hypercholesterolemia, unspecified: Secondary | ICD-10-CM

## 2015-05-07 DIAGNOSIS — E669 Obesity, unspecified: Secondary | ICD-10-CM | POA: Diagnosis not present

## 2015-05-07 DIAGNOSIS — F439 Reaction to severe stress, unspecified: Secondary | ICD-10-CM

## 2015-05-07 NOTE — Patient Instructions (Signed)
[employer]  [employer]  [employer] [facility]

## 2015-05-07 NOTE — Progress Notes (Signed)
Patient ID: Carol Jimenez, female   DOB: Feb 04, 1961, 54 y.o.   MRN: [id number]   Subjective:    Patient ID: Carol Jimenez, female    DOB: 1960/10/05, 54 y.o.   MRN: [id number]  HPI  Patient with past history of hypothyroidism.  Here to day to follow up on this issue.  Last visit, she was started on effexor.  Feels "more level" on the medication.  Feels is doing well on the current dose of effexor. Some hot flashes. Does not feel needs anything more at this time.  Tries to stay active.  We discussed her cholesterol.  Discussed treatment.  She declines cholesterol medication.  Wants to work on diet and exercise.  No nausea or vomiting.  Bowels stable.     Past Medical History  Diagnosis Date  . Allergy   . Hypothyroidism   . Hx: UTI (urinary tract infection)    Past Surgical History  Procedure Laterality Date  . Cesarean section    . Endometrial ablation  2000  . Breast biopsy Left 2015    negative, stereotactic biopsy   Family History  Problem Relation Age of Onset  . Congestive Heart Failure Mother   . Diabetes Mother   . Hypertension Father   . Colon polyps    . Esophageal cancer Father 63    treated at [facility]  . COPD Mother    Social History   Social History  . Marital Status: Married    Spouse Name: N/A  . Number of Children: N/A  . Years of Education: N/A   Social History Main Topics  . Smoking status: Never Smoker   . Smokeless tobacco: Never Used  . Alcohol Use: 0.0 oz/week    0 Standard drinks or equivalent per week     Comment: rare  . Drug Use: No  . Sexual Activity: Not Asked   Other Topics Concern  . None   Social History Narrative    Outpatient Encounter Prescriptions as of 05/07/2015  Medication Sig  . ARMOUR THYROID 90 MG tablet take 1 tablet by mouth once daily  . estradiol (ESTRACE) 0.5 MG tablet Take 1-2 tablets q day  . fluocinonide cream (LIDEX) 0.05 %   . hydrochlorothiazide (HYDRODIURIL) 25 MG tablet take 1 tablet by mouth once daily    . meloxicam (MOBIC) 15 MG tablet Take 15 mg by mouth daily.  . montelukast (SINGULAIR) 10 MG tablet Take 10 mg by mouth at bedtime.  . progesterone (PROMETRIUM) 100 MG capsule take 1 capsule by mouth once daily  . venlafaxine XR (EFFEXOR XR) 37.5 MG 24 hr capsule Take 1 capsule (37.5 mg total) by mouth daily with breakfast.   No facility-administered encounter medications on file as of 05/07/2015.    Review of Systems  Constitutional: Negative for appetite change and unexpected weight change.  HENT: Negative for congestion and sinus pressure.   Eyes: Negative for pain and discharge.  Respiratory: Negative for cough, chest tightness and shortness of breath.   Cardiovascular: Negative for chest pain, palpitations and leg swelling.  Gastrointestinal: Negative for nausea, vomiting, abdominal pain and diarrhea.  Genitourinary: Negative for dysuria and difficulty urinating.  Musculoskeletal: Negative for back pain and joint swelling.  Skin: Negative for color change and rash.  Neurological: Negative for dizziness, light-headedness and headaches.  Psychiatric/Behavioral: Negative for dysphoric mood and agitation.       Objective:    Physical Exam  Constitutional: She appears well-developed and well-nourished. No distress.  HENT:  Nose: Nose normal.  Mouth/Throat: Oropharynx is clear and moist.  Eyes: Conjunctivae are normal. Right eye exhibits no discharge. Left eye exhibits no discharge.  Neck: Neck supple. No thyromegaly present.  Cardiovascular: Normal rate and regular rhythm.   Pulmonary/Chest: Breath sounds normal. No respiratory distress. She has no wheezes.  Abdominal: Soft. Bowel sounds are normal. There is no tenderness.  Musculoskeletal: She exhibits no edema or tenderness.  Lymphadenopathy:    She has no cervical adenopathy.  Skin: No rash noted. No erythema.  Psychiatric: She has a normal mood and affect. Her behavior is normal.    BP 120/80 mmHg  Pulse 79  Temp(Src)  98.6 F (37 C) (Oral)  Resp 18  Ht 5\' 6"  (1.676 m)  Wt 199 lb (90.266 kg)  BMI 32.13 kg/m2  SpO2 96%  LMP 12/26/2012 Wt Readings from Last 3 Encounters:  05/07/15 199 lb (90.266 kg)  03/21/15 201 lb 2 oz (91.23 kg)  12/23/14 197 lb (89.359 kg)     Lab Results  Component Value Date   WBC 6.1 05/05/2015   HGB 15.0 05/05/2015   HCT 44.0 05/05/2015   PLT 211.0 05/05/2015   GLUCOSE 110* 05/05/2015   CHOL 211* 05/05/2015   TRIG 253.0* 05/05/2015   HDL 46.40 05/05/2015   LDLDIRECT 144.0 05/05/2015   ALT 29 05/05/2015   AST 28 05/05/2015   NA 137 05/05/2015   K 4.0 05/05/2015   CL 98 05/05/2015   CREATININE 0.72 05/05/2015   BUN 12 05/05/2015   CO2 29 05/05/2015   TSH 1.05 05/05/2015   HGBA1C 5.6 10/30/2014    Mm Digital Screening Bilateral  04/04/2015  CLINICAL DATA:  Screening. EXAM: DIGITAL SCREENING BILATERAL MAMMOGRAM WITH CAD COMPARISON:  Previous exam(s). ACR Breast Density Category b: There are scattered areas of fibroglandular density. FINDINGS: There are no findings suspicious for malignancy. Images were processed with CAD. IMPRESSION: No mammographic evidence of malignancy. A result letter of this screening mammogram will be mailed directly to the patient. RECOMMENDATION: Screening mammogram in one year. (Code:SM-B-01Y) BI-RADS CATEGORY  1: Negative. Electronically Signed   By: Beckie SaltsSteven  Reid M.D.   On: 04/04/2015 08:35       Assessment & Plan:   Problem List Items Addressed This Visit    Hypercholesterolemia - Primary    Discussed with her today.  Discussed her cholesterol levels and treatment.  She wants to hold on prescription medication.  Wants to work on diet and exercise.  Follow lipid panel.       Menopausal symptoms    On effexor.  Some increased hot flashes.  Feels better.  Follow.  Hopefully will be able to taper estrogen in the future.        Obesity (BMI 30.0-34.9)    Diet and exercise.  Follow.       Stress    Doing well on effexor.  Follow.            Dale DurhamSCOTT, Rashanna Christiana, MD

## 2015-05-07 NOTE — Progress Notes (Signed)
Pre-visit discussion using our clinic review tool. No additional management support is needed unless otherwise documented below in the visit note.  

## 2015-05-11 ENCOUNTER — Encounter: Payer: Self-pay | Admitting: Internal Medicine

## 2015-05-11 NOTE — Assessment & Plan Note (Signed)
Discussed with her today.  Discussed her cholesterol levels and treatment.  She wants to hold on prescription medication.  Wants to work on diet and exercise.  Follow lipid panel.

## 2015-05-11 NOTE — Assessment & Plan Note (Signed)
On effexor.  Some increased hot flashes.  Feels better.  Follow.  Hopefully will be able to taper estrogen in the future.

## 2015-05-11 NOTE — Assessment & Plan Note (Signed)
Doing well on effexor.  Follow.   

## 2015-05-11 NOTE — Assessment & Plan Note (Signed)
Diet and exercise.  Follow.  

## 2015-05-13 ENCOUNTER — Ambulatory Visit (INDEPENDENT_AMBULATORY_CARE_PROVIDER_SITE_OTHER): Payer: BLUE CROSS/BLUE SHIELD

## 2015-05-13 DIAGNOSIS — E538 Deficiency of other specified B group vitamins: Secondary | ICD-10-CM | POA: Diagnosis not present

## 2015-05-13 MED ORDER — CYANOCOBALAMIN 1000 MCG/ML IJ SOLN
1000.0000 ug | Freq: Once | INTRAMUSCULAR | Status: AC
  Administered 2015-05-13: 1000 ug via INTRAMUSCULAR

## 2015-05-13 NOTE — Progress Notes (Signed)
Patient came in for B12 injection.  Received in Right deltoid.  Patient tolerated well.  

## 2015-05-19 ENCOUNTER — Other Ambulatory Visit: Payer: Self-pay | Admitting: Internal Medicine

## 2015-06-12 ENCOUNTER — Ambulatory Visit (INDEPENDENT_AMBULATORY_CARE_PROVIDER_SITE_OTHER): Payer: BLUE CROSS/BLUE SHIELD

## 2015-06-12 DIAGNOSIS — E538 Deficiency of other specified B group vitamins: Secondary | ICD-10-CM | POA: Diagnosis not present

## 2015-06-12 MED ORDER — CYANOCOBALAMIN 1000 MCG/ML IJ SOLN
1000.0000 ug | Freq: Once | INTRAMUSCULAR | Status: AC
  Administered 2015-06-12: 1000 ug via INTRAMUSCULAR

## 2015-06-12 NOTE — Progress Notes (Signed)
Patient came in for B12 injection.  Received in Right deltoid.  Patient tolerated well.  

## 2015-07-07 ENCOUNTER — Other Ambulatory Visit: Payer: Self-pay | Admitting: Internal Medicine

## 2015-07-18 ENCOUNTER — Other Ambulatory Visit: Payer: Self-pay | Admitting: Internal Medicine

## 2015-07-22 ENCOUNTER — Ambulatory Visit (INDEPENDENT_AMBULATORY_CARE_PROVIDER_SITE_OTHER): Payer: BLUE CROSS/BLUE SHIELD

## 2015-07-22 ENCOUNTER — Ambulatory Visit: Payer: BLUE CROSS/BLUE SHIELD | Admitting: Internal Medicine

## 2015-07-22 DIAGNOSIS — E538 Deficiency of other specified B group vitamins: Secondary | ICD-10-CM | POA: Diagnosis not present

## 2015-07-22 MED ORDER — CYANOCOBALAMIN 1000 MCG/ML IJ SOLN
1000.0000 ug | Freq: Once | INTRAMUSCULAR | Status: AC
  Administered 2015-07-22: 1000 ug via INTRAMUSCULAR

## 2015-07-22 NOTE — Progress Notes (Signed)
Patient came in for a b12 injection.  Patient received in Left deltoid.  Patient tolerated well.  

## 2015-08-15 ENCOUNTER — Ambulatory Visit
Admission: RE | Admit: 2015-08-15 | Discharge: 2015-08-15 | Disposition: A | Discharge status: Home / Self Care | Payer: BLUE CROSS/BLUE SHIELD | Source: Ambulatory Visit | Attending: Internal Medicine | Admitting: Internal Medicine

## 2015-08-15 ENCOUNTER — Ambulatory Visit (INDEPENDENT_AMBULATORY_CARE_PROVIDER_SITE_OTHER): Payer: BLUE CROSS/BLUE SHIELD | Admitting: Internal Medicine

## 2015-08-15 ENCOUNTER — Encounter: Payer: Self-pay | Admitting: Internal Medicine

## 2015-08-15 VITALS — BP 110/82 | HR 75 | Temp 98.6°F | Resp 18 | Ht 66.0 in | Wt 199.8 lb

## 2015-08-15 DIAGNOSIS — M1712 Unilateral primary osteoarthritis, left knee: Secondary | ICD-10-CM | POA: Diagnosis not present

## 2015-08-15 DIAGNOSIS — E039 Hypothyroidism, unspecified: Secondary | ICD-10-CM | POA: Diagnosis not present

## 2015-08-15 DIAGNOSIS — Z9109 Other allergy status, other than to drugs and biological substances: Secondary | ICD-10-CM

## 2015-08-15 DIAGNOSIS — M25562 Pain in left knee: Secondary | ICD-10-CM

## 2015-08-15 DIAGNOSIS — E669 Obesity, unspecified: Secondary | ICD-10-CM

## 2015-08-15 DIAGNOSIS — E66811 Obesity, class 1: Secondary | ICD-10-CM

## 2015-08-15 DIAGNOSIS — R7989 Other specified abnormal findings of blood chemistry: Secondary | ICD-10-CM

## 2015-08-15 DIAGNOSIS — Z658 Other specified problems related to psychosocial circumstances: Secondary | ICD-10-CM

## 2015-08-15 DIAGNOSIS — R945 Abnormal results of liver function studies: Secondary | ICD-10-CM

## 2015-08-15 DIAGNOSIS — E7800 Pure hypercholesterolemia, unspecified: Secondary | ICD-10-CM

## 2015-08-15 DIAGNOSIS — F439 Reaction to severe stress, unspecified: Secondary | ICD-10-CM

## 2015-08-15 DIAGNOSIS — Z91048 Other nonmedicinal substance allergy status: Secondary | ICD-10-CM

## 2015-08-15 DIAGNOSIS — N951 Menopausal and female climacteric states: Secondary | ICD-10-CM

## 2015-08-15 NOTE — Progress Notes (Signed)
Pre-visit discussion using our clinic review tool. No additional management support is needed unless otherwise documented below in the visit note.  

## 2015-08-15 NOTE — Progress Notes (Signed)
Patient ID: Carol Jimenez, female   DOB: [birth year], 55 y.o.   MRN: [id number]   Subjective:    Patient ID: Carol Jimenez, female    DOB: 02-17-1961, 55 y.o.   MRN: [id number]  HPI  Patient with past history of hypercholesterolemia, allergies and hypothyroidism.  She comes in today to follow up on these issues.  She just recently saw dermatology.  Has facial skin cancer.  States is using a chemo cream.  Using for two more weeks.  Due f/u with dermatology 10/2015.  She has been doing yoga.  Started 06/2015.  Was down on her knees.  Hurt her knee.  Her knee has been bothering her since.  Was swollen.  She had applied ice.  swellin gbetter.  Still with pain.  Getting up and down - worse.  Walking better.  Saw chiropractor.  Worse.  Breathing stable.  No chest pain or tightness.  No sob.  Bowels stable.  On effexor and feels this is working well.  Follow.     Past Medical History  Diagnosis Date  . Allergy   . Hypothyroidism   . Hx: UTI (urinary tract infection)    Past Surgical History  Procedure Laterality Date  . Cesarean section    . Endometrial ablation  2000  . Breast biopsy Left 2015    negative, stereotactic biopsy   Family History  Problem Relation Age of Onset  . Congestive Heart Failure Mother   . Diabetes Mother   . Hypertension Father   . Colon polyps    . Esophageal cancer Father 31    treated at [facility]  . COPD Mother    Social History   Social History  . Marital Status: Married    Spouse Name: N/A  . Number of Children: N/A  . Years of Education: N/A   Social History Main Topics  . Smoking status: Never Smoker   . Smokeless tobacco: Never Used  . Alcohol Use: 0.0 oz/week    0 Standard drinks or equivalent per week     Comment: rare  . Drug Use: No  . Sexual Activity: Not Asked   Other Topics Concern  . None   Social History Narrative     Review of Systems  Constitutional: Negative for appetite change and unexpected weight change.  HENT: Negative  for congestion and sinus pressure.   Respiratory: Negative for cough, chest tightness and shortness of breath.   Cardiovascular: Negative for chest pain, palpitations and leg swelling.  Gastrointestinal: Negative for nausea, vomiting, abdominal pain and diarrhea.  Genitourinary: Negative for dysuria and difficulty urinating.  Musculoskeletal: Negative for back pain.       Left knee pain as outlined.    Skin: Negative for color change and rash.  Neurological: Negative for dizziness, light-headedness and headaches.  Psychiatric/Behavioral: Negative for dysphoric mood and agitation.       Objective:     Blood pressure rechecked by me:  130/84  Physical Exam  Constitutional: She appears well-developed and well-nourished. No distress.  HENT:  Nose: Nose normal.  Mouth/Throat: Oropharynx is clear and moist.  Neck: Neck supple. No thyromegaly present.  Cardiovascular: Normal rate and regular rhythm.   Pulmonary/Chest: Breath sounds normal. No respiratory distress. She has no wheezes.  Abdominal: Soft. Bowel sounds are normal. There is no tenderness.  Musculoskeletal: She exhibits no edema or tenderness.  Increased pain to palpation over the knee - lower anterior knee.  No increased erythema or warmth.  Lymphadenopathy:    She has no cervical adenopathy.  Skin: No rash noted. No erythema.  Psychiatric: She has a normal mood and affect. Her behavior is normal.    BP 110/82 mmHg  Pulse 75  Temp(Src) 98.6 F (37 C) (Oral)  Resp 18  Ht  (1.676 m)  Wt 199 lb 12 oz (90.606 kg)  BMI 32.26 kg/m2  SpO2 96%  LMP 12/26/2012 Wt Readings from Last 3 Encounters:  08/15/15 199 lb 12 oz (90.606 kg)  05/07/15 199 lb (90.266 kg)  03/21/15 201 lb 2 oz (91.23 kg)     Lab Results  Component Value Date   WBC 6.1 05/05/2015   HGB 15.0 05/05/2015   HCT 44.0 05/05/2015   PLT 211.0 05/05/2015   GLUCOSE 110* 05/05/2015   CHOL 211* 05/05/2015   TRIG 253.0* 05/05/2015   HDL 46.40  05/05/2015   LDLDIRECT 144.0 05/05/2015   ALT 29 05/05/2015   AST 28 05/05/2015   NA 137 05/05/2015   K 4.0 05/05/2015   CL 98 05/05/2015   CREATININE 0.72 05/05/2015   BUN 12 05/05/2015   CO2 29 05/05/2015   TSH 1.05 05/05/2015   HGBA1C 5.6 10/30/2014    Mm Digital Screening Bilateral  04/04/2015  CLINICAL DATA:  Screening. EXAM: DIGITAL SCREENING BILATERAL MAMMOGRAM WITH CAD COMPARISON:  Previous exam(s). ACR Breast Density Category b: There are scattered areas of fibroglandular density. FINDINGS: There are no findings suspicious for malignancy. Images were processed with CAD. IMPRESSION: No mammographic evidence of malignancy. A result letter of this screening mammogram will be mailed directly to the patient. RECOMMENDATION: Screening mammogram in one year. (Code:SM-B-01Y) BI-RADS CATEGORY  1: Negative. Electronically Signed   By: Beckie Salts M.D.   On: 04/04/2015 08:35       Assessment & Plan:   Problem List Items Addressed This Visit    Abnormal liver function tests    Last liver panel wnl.  Follow.        Environmental allergies    Taking singulair.  Doing well.        Hypercholesterolemia    Low cholesterol diet and exercise.  Follow lipid panel.  She has wanted to hold on cholesterol medication.  Follow.        Relevant Orders   Hepatic function panel   Lipid panel   Basic metabolic panel   Hypothyroid    On thyroid replacement.  Follow tsh.        Left knee pain - Primary    Persistent left knee pain as outlined.  Check xray.  Tylenol as directed.  Follow.        Relevant Orders   DG Knee 1-2 Views Left (Completed)   Menopausal symptoms    On effexor.  Doing better.  Feels this dose is working well.  Follow.        Obesity (BMI 30.0-34.9)    Diet and exercise.        Stress    Doing well on effexor.  Feels this dose is working well.  Follow.            Dale Calabash, MD

## 2015-08-17 ENCOUNTER — Encounter: Payer: Self-pay | Admitting: Internal Medicine

## 2015-08-17 DIAGNOSIS — M25562 Pain in left knee: Secondary | ICD-10-CM

## 2015-08-17 DIAGNOSIS — M25561 Pain in right knee: Secondary | ICD-10-CM | POA: Insufficient documentation

## 2015-08-17 NOTE — Assessment & Plan Note (Signed)
Low cholesterol diet and exercise.  Follow lipid panel.  She has wanted to hold on cholesterol medication.  Follow.

## 2015-08-17 NOTE — Assessment & Plan Note (Signed)
Last liver panel wnl.  Follow.  

## 2015-08-17 NOTE — Assessment & Plan Note (Signed)
Taking singulair.  Doing well.

## 2015-08-17 NOTE — Assessment & Plan Note (Signed)
Doing well on effexor.  Feels this dose is working well.  Follow.

## 2015-08-17 NOTE — Assessment & Plan Note (Signed)
On effexor.  Doing better.  Feels this dose is working well.  Follow.

## 2015-08-17 NOTE — Assessment & Plan Note (Signed)
Persistent left knee pain as outlined.  Check xray.  Tylenol as directed.  Follow.

## 2015-08-17 NOTE — Assessment & Plan Note (Signed)
Diet and exercise.   

## 2015-08-17 NOTE — Assessment & Plan Note (Signed)
On thyroid replacement.  Follow tsh.  

## 2015-08-18 NOTE — Telephone Encounter (Signed)
Order placed for physical therapy referral.   

## 2015-08-25 ENCOUNTER — Ambulatory Visit: Payer: BLUE CROSS/BLUE SHIELD | Attending: Internal Medicine | Admitting: Physical Therapy

## 2015-08-25 DIAGNOSIS — M25562 Pain in left knee: Secondary | ICD-10-CM | POA: Diagnosis not present

## 2015-08-25 NOTE — Patient Instructions (Signed)
All exercises provided were adapted from [employer]. Patient was provided a written handout with pictures as described. Any additional cues were manually entered in to handout and copied in to this document.  Prone Quadriceps Stretch  Lie on your stomach; hook a strap around your foot/ankle.  Gently pull your heel towards your bottom until a stretch is felt in the front of the thigh.  Keep your back relaxed and your hips flat on the table.  patellar mobilization  With your knee supported and slightly bent, find the boarders of your knee cap, gently push side to side.

## 2015-08-25 NOTE — Therapy (Signed)
[facility] [address] [place], [address] Phone: [phone]   Fax:  [phone]  Physical Therapy Treatment  Patient Details  Name: Carol Jimenez MRN: [id number] Date of Birth: 05/25/1961 No Data Recorded  Encounter Date: 08/25/2015      PT End of Session - 08/25/15 1111    Visit Number 1   Number of Visits 9   Date for PT Re-Evaluation 09/22/15   PT Start Time 1015   PT Stop Time 1108   PT Time Calculation (min) 53 min   Activity Tolerance Patient tolerated treatment well   Behavior During Therapy [facility] for tasks assessed/performed      Past Medical History  Diagnosis Date  . Allergy   . Hypothyroidism   . Hx: UTI (urinary tract infection)     Past Surgical History  Procedure Laterality Date  . Cesarean section    . Endometrial ablation  2000  . Breast biopsy Left 2015    negative, stereotactic biopsy    There were no vitals filed for this visit.  Visit Diagnosis:  Left anterior knee pain - Plan: PT plan of care cert/re-cert      Subjective Assessment - 08/25/15 1752    Subjective Patient reports she performed roughly 4 weeks of yoga class, which involved extensive time on her knees, which at the time was painful -- though lessened between sessions. These finished late January, and in early February she began to have intense L knee pain, particularly with knee flexed and with walking. X-rays are negative for fx, though mild OA change in medial compartment is noted. No numbness, tingling, or radiation down the leg of pain. Patient reports no back pain in some time.    Limitations Walking;Sitting   Diagnostic tests X-ray "Mild osteoarthritic change of the left knee centered on the medial compartment"   Patient Stated Goals To return to walking program for exercise.    Currently in Pain? Yes   Pain Score 3    Pain Location --  Right around pes anserine and tibial tubercle.   Pain  Orientation Left;Anterior   Pain Descriptors / Indicators Aching   Pain Type Acute pain   Pain Radiating Towards L hip on lateral side, every now and again.    Pain Onset 1 to 4 weeks ago   Pain Frequency Constant   Aggravating Factors  Knee flexed, walking   Pain Relieving Factors Knee extension, icing.             [facility] PT Assessment - 08/25/15 1113    Assessment   Medical Diagnosis --  Left knee pain   Onset Date/Surgical Date 08/04/15   Precautions   Precautions None   Restrictions   Weight Bearing Restrictions No   Balance Screen   Has the patient fallen in the past 6 months No   Prior Function   Level of Independence Independent   Vocation --  Works for [employer] --  [employer] a lot of sitting.    Cognition   Overall Cognitive Status Within Functional Limits for tasks assessed   Observation/Other Assessments   Lower Extremity Functional Scale  39   Sensation   Light Touch Appears Intact   Strength   Right Hip Flexion 5/5   Right Hip External Rotation  5/5   Right Hip Internal Rotation 5/5   Left Hip Flexion 5/5   Left Hip Extension 5/5   Left Hip  External Rotation 5/5   Left Hip Internal Rotation 4+/5  Pain limited   Left Hip ABduction 3+/5  Pain limited   Right Knee Flexion 5/5   Right Knee Extension 5/5   Left Knee Flexion 5/5   Left Knee Extension 5/5  Pain limited.    Palpation   Patella mobility --  Painful on LLE, medial glides > lateral glides   Spinal mobility --  WNL, no joint signs present   Palpation comment --  Pain around pes anserine and proximal femoral area of quad   Ober's Test   Findings Positive   Side Left   Comments --  Mild reproduction of pain with this   Criag's Test    Findings Positive   Side Left   Comments --  Mild pain increase with ER not IR of hip   Ely's Test   Findings Positive   Side Left   Comments --  Tightness noted, relief with stretching in this position.        TherEx PNF Ely's position x 10 repetitions for 2 sets, additional set with belt educating patient to perform self-stretching. Noticeable decrease in pain with gait and transfers after. Medial and lateral glides x 2 bouts of 30" in each direction, notably more tender with medial glide. Patient educated on how to laterally glide patella, though unclear how carry over will be.                     PT Education - 08/25/15 1112    Education provided Yes   Education Details Patient educated on likely source of pain (from tight retinaculum, tightness around quadriceps insertion) and stretching program along with time frame.    Person(s) Educated Patient   Methods Explanation;Demonstration;Handout   Comprehension Verbalized understanding;Returned demonstration             PT Long Term Goals - 08/25/15 1756    PT LONG TERM GOAL #1   Title Patient will walk 20 minutes for her home program with no increase in pain to display return to prior level of activities.    Time 4   Period Weeks   Status New   PT LONG TERM GOAL #2   Title Patient will report an LEFS score of > 50/80 to demonstrate improved tolerance for completing ADLs.    Time 4   Period Weeks   Status New   PT LONG TERM GOAL #3   Title Patient will complete 5x sit to stand with no increase in anterior knee pain to demonstrate improved tolerance for ADLs.   Baseline Pain with standing each attempt.    Time 4   Period Weeks   Status New               Plan - 08/25/15 1758    Clinical Impression Statement Patient reports pain provoked with patellar mobilizations and relief of symptoms with quadriceps stretching, indicating she likely has some component of patellofemoral joint compression, possibly irritating her fat pad. Patient reports decreased pain with sit to stand transfers and ambulation after tx of the above impairments, though patellar mobilizations are rather sensitive. Skilled PT is indicated  to address her pain symptoms limiting her functional mobility.    Pt will benefit from skilled therapeutic intervention in order to improve on the following deficits Pain;Decreased activity tolerance;Decreased strength;Difficulty walking   Rehab Potential Good   Clinical Impairments Affecting Rehab Potential Good response to initial tx session and acuteness of complaints.  PT Frequency 2x / week   PT Duration 4 weeks   PT Treatment/Interventions Cryotherapy;Therapeutic exercise;Therapeutic activities;Manual techniques;Taping;Gait training   PT Next Visit Plan Progress quadricep stretching, evaluate for lateral shift, soft tissue and patellar mobilizations as indicated.    PT Home Exercise Plan Patellar mobilizations and prone quadricep stretching.         Problem List Patient Active Problem List   Diagnosis Date Noted  . Left knee pain 08/17/2015  . Stress 03/23/2015  . Health care maintenance 12/24/2014  . History of non anemic vitamin B12 deficiency 12/23/2014  . Vitamin D deficiency 12/23/2014  . Rib pain on right side 12/23/2014  . Pseudoangiomatous stromal hyperplasia of breast 08/12/2014  . Obesity (BMI 30.0-34.9) 04/13/2014  . Abnormal liver function tests 04/13/2014  . Hypothyroid 11/12/2013  . Menopausal symptoms 11/12/2013  . Side pain 11/12/2013  . Fatigue 11/12/2013  . Daytime somnolence 11/12/2013  . Environmental allergies 11/12/2013  . Hypercholesterolemia 11/12/2013   Kerin Ransom, PT, DPT    08/25/2015, 6:03 PM  [facility] SPORTS [facility]ICINE [address], [place], [address] Phone: [phone]   Fax:  [phone]  Name: Carol Jimenez MRN: [id number] Date of Birth: [birth year]

## 2015-08-26 ENCOUNTER — Ambulatory Visit: Payer: BLUE CROSS/BLUE SHIELD

## 2015-08-27 ENCOUNTER — Ambulatory Visit: Payer: BLUE CROSS/BLUE SHIELD | Attending: Internal Medicine | Admitting: Physical Therapy

## 2015-08-27 ENCOUNTER — Ambulatory Visit (INDEPENDENT_AMBULATORY_CARE_PROVIDER_SITE_OTHER): Payer: BLUE CROSS/BLUE SHIELD

## 2015-08-27 DIAGNOSIS — M6281 Muscle weakness (generalized): Secondary | ICD-10-CM | POA: Diagnosis present

## 2015-08-27 DIAGNOSIS — E538 Deficiency of other specified B group vitamins: Secondary | ICD-10-CM

## 2015-08-27 DIAGNOSIS — M25562 Pain in left knee: Secondary | ICD-10-CM | POA: Diagnosis not present

## 2015-08-27 MED ORDER — CYANOCOBALAMIN 1000 MCG/ML IJ SOLN
1000.0000 ug | Freq: Once | INTRAMUSCULAR | Status: AC
  Administered 2015-08-27: 1000 ug via INTRAMUSCULAR

## 2015-08-27 NOTE — Therapy (Signed)
[facility] [address] [place], [address] Phone: [phone]   Fax:  [phone]  Physical Therapy Treatment  Patient Details  Name: Carol Jimenez MRN: [id number] Date of Birth: 11-Oct-1960 No Data Recorded  Encounter Date: 08/27/2015      PT End of Session - 08/27/15 0947    Visit Number 2   Number of Visits 9   Date for PT Re-Evaluation 09/22/15   PT Start Time 0924   PT Stop Time 1004   PT Time Calculation (min) 40 min   Activity Tolerance Patient tolerated treatment well   Behavior During Therapy [facility] for tasks assessed/performed      Past Medical History  Diagnosis Date  . Allergy   . Hypothyroidism   . Hx: UTI (urinary tract infection)     Past Surgical History  Procedure Laterality Date  . Cesarean section    . Endometrial ablation  2000  . Breast biopsy Left 2015    negative, stereotactic biopsy    There were no vitals filed for this visit.  Visit Diagnosis:  Left anterior knee pain      Subjective Assessment - 08/27/15 0926    Subjective Pt reports she has a dull ache today, but overall is feeling signficantly better than prior to her initial visit.   Limitations Walking;Sitting   Diagnostic tests X-ray "Mild osteoarthritic change of the left knee centered on the medial compartment"   Patient Stated Goals To return to walking program for exercise.    Currently in Pain? Yes   Pain Score 2    Pain Location Knee   Pain Orientation Left;Anterior   Pain Onset 1 to 4 weeks ago                Objective: Patellar mobs med/lat 3x1 min each.  Tib/femur AP/PA 3x1 min each.  Following this pt reported decr. Resting pain from 2 to 0/10.  Quarter squats performed, extensive cuing to minimize anterior knee translation. 10 min total practice.  TG set at 21, 3x15 full squats with cuing to minimize knee valgus with performance.  Assessed stairs, not yet appropriate to  treat as pain level is too high with descent.  Knee stretch/lunge on 2nd step with cuing (manual and verbal) 5 min. Continued to require cuing to minimize knee valgus. Issued this and quarter squat as HEP                      PT Long Term Goals - 08/25/15 1756    PT LONG TERM GOAL #1   Title Patient will walk 20 minutes for her home program with no increase in pain to display return to prior level of activities.    Time 4   Period Weeks   Status New   PT LONG TERM GOAL #2   Title Patient will report an LEFS score of > 50/80 to demonstrate improved tolerance for completing ADLs.    Time 4   Period Weeks   Status New   PT LONG TERM GOAL #3   Title Patient will complete 5x sit to stand with no increase in anterior knee pain to demonstrate improved tolerance for ADLs.   Baseline Pain with standing each attempt.    Time 4   Period Weeks   Status New               Plan - 08/27/15 1004    Clinical Impression Statement  Pt continues to have some sensitivity with patellar mobs and tight quads. Pain with descending stairs, in car, and with squats. Pain improved with manual intervention as well as modifications to car sitting posture, squat. Pt would benefit from continued PT to address pain, muscle weakness and decr. ROM.   Pt will benefit from skilled therapeutic intervention in order to improve on the following deficits Pain;Decreased activity tolerance;Decreased strength;Difficulty walking   Clinical Impairments Affecting Rehab Potential Good response to initial tx session and acuteness of complaints.    PT Frequency 2x / week   PT Duration 4 weeks   PT Treatment/Interventions Cryotherapy;Therapeutic exercise;Therapeutic activities;Manual techniques;Taping;Gait training        Problem List Patient Active Problem List   Diagnosis Date Noted  . Left knee pain 08/17/2015  . Stress 03/23/2015  . Health care maintenance 12/24/2014  . History of non anemic vitamin  B12 deficiency 12/23/2014  . Vitamin D deficiency 12/23/2014  . Rib pain on right side 12/23/2014  . Pseudoangiomatous stromal hyperplasia of breast 08/12/2014  . Obesity (BMI 30.0-34.9) 04/13/2014  . Abnormal liver function tests 04/13/2014  . Hypothyroid 11/12/2013  . Menopausal symptoms 11/12/2013  . Side pain 11/12/2013  . Fatigue 11/12/2013  . Daytime somnolence 11/12/2013  . Environmental allergies 11/12/2013  . Hypercholesterolemia 11/12/2013    Fisher,Benjamin PT DPT 08/27/2015, 10:31 AM  [place] [facility] [facility] [address] [place], [address] Phone: [phone]   Fax:  [phone]  Name: Carol Jimenez MRN: [id number] Date of Birth: [birth year]

## 2015-08-27 NOTE — Progress Notes (Signed)
Patient came in for B12 injection.  Received in right deltoid.  Patient tolerated well.   

## 2015-09-01 ENCOUNTER — Ambulatory Visit: Payer: BLUE CROSS/BLUE SHIELD

## 2015-09-01 DIAGNOSIS — M25562 Pain in left knee: Secondary | ICD-10-CM | POA: Diagnosis not present

## 2015-09-02 NOTE — Therapy (Signed)
[place] [facility] [address], [place], [address] Phone: [phone]   Fax:  [phone]  Physical Therapy Treatment  Patient Details  Name: Carol Jimenez MRN: [id number] Date of Birth: April 25, 1961 No Data Recorded  Encounter Date: 09/01/2015      PT End of Session - 09/02/15 0843    Visit Number 3   Number of Visits 9   Date for PT Re-Evaluation 09/22/15   PT Start Time 1507   PT Stop Time 1547   PT Time Calculation (min) 40 min   Activity Tolerance Patient tolerated treatment well   Behavior During Therapy [facility] for tasks assessed/performed      Past Medical History  Diagnosis Date  . Allergy   . Hypothyroidism   . Hx: UTI (urinary tract infection)     Past Surgical History  Procedure Laterality Date  . Cesarean section    . Endometrial ablation  2000  . Breast biopsy Left 2015    negative, stereotactic biopsy    There were no vitals filed for this visit.  Visit Diagnosis:  Left anterior knee pain      Subjective Assessment - 09/01/15 1511    Subjective Pt reports that over the weekend she attempted to perform resisted knee extension on home gym and knee pain returned. Pt states that knee pain is same as it was before she started therapy after the weekend wheras previously she felt like she was making progress. She denies pain at rest but reports increase in pain to 8/10 with extended sitting in the car.    Limitations Walking;Sitting   Diagnostic tests X-ray "Mild osteoarthritic change of the left knee centered on the medial compartment"   Patient Stated Goals To return to walking program for exercise.    Currently in Pain? No/denies  Pain increases to 8/10 with aggravating activities      TREATMENT  Manual Therapy Inferior patellar mobs 30 seconds x 3, pt with pain so amplitude decreased; Tib/femur AP/PA 30 seconds x 3 each; Extensive IASTM with blunt side of "edge tool" over pes  anserine and working proximal to medial joint line. Pt with pinpoint tenderness of pes anserine which positively reproduces her pain. Pt with progressive decrease in tenderness to palpation as duration of IASTM progresses. Pt reports significant relief of pain at end;  There-ex R sidelying L hip abduction 2 x 10, cues for proper technique and to avoid hip ER. 4-/5 L hip abduction strength; Prone R quadricep stretch 30 second hold x 3; Prone hip extension assessed with AROM and 4-/5 bilateral strength; Positive reproduction of pain with HS strength testing in prone;                           PT Education - 09/02/15 0842    Education provided Yes   Education Details Plan of care, HEP reinforced   Person(s) Educated Patient   Methods Explanation   Comprehension Verbalized understanding             PT Long Term Goals - 08/25/15 1756    PT LONG TERM GOAL #1   Title Patient will walk 20 minutes for her home program with no increase in pain to display return to prior level of activities.    Time 4   Period Weeks   Status New   PT LONG TERM GOAL #2   Title Patient will report an LEFS score of >  50/80 to demonstrate improved tolerance for completing ADLs.    Time 4   Period Weeks   Status New   PT LONG TERM GOAL #3   Title Patient will complete 5x sit to stand with no increase in anterior knee pain to demonstrate improved tolerance for ADLs.   Baseline Pain with standing each attempt.    Time 4   Period Weeks   Status New               Plan - 09/02/15 16100843    Clinical Impression Statement Pt demonstrates localized tenderness of pes anserine with palpation. She also presents with reproduction of medial knee pain during HS strength testing as well as pain with medial joint laxity testing. All pain is localized over the area of the pes anserine which leads therapist to believe that she may have some contribution of pes anserine bursitis. Pt will benefit  from continued manual techniques as well as stretching to decrease pain. She may benefit from iontophoresis with dexamethasone over area however this was not included in initial certification. Will defer to primary therapist as to whether to send a new certification to obtain this order. Pt reports significant reduction in pain following manual techniques today.    Pt will benefit from skilled therapeutic intervention in order to improve on the following deficits Pain;Decreased activity tolerance;Decreased strength;Difficulty walking   Clinical Impairments Affecting Rehab Potential Good response to initial tx session and acuteness of complaints.    PT Frequency 2x / week   PT Duration 4 weeks   PT Treatment/Interventions Cryotherapy;Therapeutic exercise;Therapeutic activities;Manual techniques;Taping;Gait training   PT Next Visit Plan Progress quadricep stretching, IASTM of pes anserineworking proximal along tendons of semitendinosis, gracilis, and sartorius. Patellar and tibia mobilizations as indicated.         Problem List Patient Active Problem List   Diagnosis Date Noted  . Left knee pain 08/17/2015  . Stress 03/23/2015  . Health care maintenance 12/24/2014  . History of non anemic vitamin B12 deficiency 12/23/2014  . Vitamin D deficiency 12/23/2014  . Rib pain on right side 12/23/2014  . Pseudoangiomatous stromal hyperplasia of breast 08/12/2014  . Obesity (BMI 30.0-34.9) 04/13/2014  . Abnormal liver function tests 04/13/2014  . Hypothyroid 11/12/2013  . Menopausal symptoms 11/12/2013  . Side pain 11/12/2013  . Fatigue 11/12/2013  . Daytime somnolence 11/12/2013  . Environmental allergies 11/12/2013  . Hypercholesterolemia 11/12/2013   Lynnea MaizesJason D Davante Gerke PT, DPT   Simren Popson 09/02/2015, 8:49 AM  [facility] [address] [place], [place], [address] Phone: [phone]   Fax:  [phone]  Name: Valinda Hoarlizabeth B  Gura MRN: [id number] Date of Birth: 07/05/1960

## 2015-09-03 ENCOUNTER — Other Ambulatory Visit: Payer: Self-pay | Admitting: Internal Medicine

## 2015-09-04 ENCOUNTER — Ambulatory Visit: Payer: BLUE CROSS/BLUE SHIELD | Admitting: Physical Therapy

## 2015-09-04 DIAGNOSIS — M25562 Pain in left knee: Secondary | ICD-10-CM | POA: Diagnosis not present

## 2015-09-04 NOTE — Therapy (Signed)
[place] [facility] [address] [place], [address] Phone: [phone]   Fax:  [phone]  Physical Therapy Treatment  Patient Details  Name: Carol Jimenez MRN: [id number] Date of Birth: 03/12/1961 No Data Recorded  Encounter Date: 09/04/2015      PT End of Session - 09/04/15 1629    Visit Number 4   Number of Visits 9   Date for PT Re-Evaluation 09/22/15   PT Start Time 1519   PT Stop Time 1600   PT Time Calculation (min) 41 min   Activity Tolerance Patient tolerated treatment well   Behavior During Therapy [facility] for tasks assessed/performed      Past Medical History  Diagnosis Date  . Allergy   . Hypothyroidism   . Hx: UTI (urinary tract infection)     Past Surgical History  Procedure Laterality Date  . Cesarean section    . Endometrial ablation  2000  . Breast biopsy Left 2015    negative, stereotactic biopsy    There were no vitals filed for this visit.  Visit Diagnosis:  Left anterior knee pain      Subjective Assessment - 09/04/15 1520    Subjective Pt reports 4/10 pain currently. "I feel like I need to pop my knee". Pain is better in the morning and worsens throughout the day. Describes her pain as primarily stiffness at this time.   Limitations Walking;Sitting   Diagnostic tests X-ray "Mild osteoarthritic change of the left knee centered on the medial compartment"   Patient Stated Goals To return to walking program for exercise.    Currently in Pain? Yes   Pain Score 4    Pain Orientation Left;Anterior                   Objective: Reassessed manually, noted trigger points in IT band, vastus lateralis, distal adductors.  STM performed on all these areas, no change in pain with gait. Knee mobs PA and AP 3x1 min each. Following this no change in pain but pt had decr. Tightness.  C-R for knee flexion in [place] position, 3x5 with 5 resists. Following this pt reported  definite improvement in overall symptoms.  Trigger point dry needling performed vastus lateralis, distal adductor. Pt reluctantly acknowledged that following this her pain was significantly improved.  Educated pt on home performance of manually overpressed stretch, including instruction on avoiding pain. Minimizing walking and irritating activities over the weekend, and icing.                    PT Long Term Goals - 08/25/15 1756    PT LONG TERM GOAL #1   Title Patient will walk 20 minutes for her home program with no increase in pain to display return to prior level of activities.    Time 4   Period Weeks   Status New   PT LONG TERM GOAL #2   Title Patient will report an LEFS score of > 50/80 to demonstrate improved tolerance for completing ADLs.    Time 4   Period Weeks   Status New   PT LONG TERM GOAL #3   Title Patient will complete 5x sit to stand with no increase in anterior knee pain to demonstrate improved tolerance for ADLs.   Baseline Pain with standing each attempt.    Time 4   Period Weeks   Status New  Plan - 09/04/15 1631    Clinical Impression Statement Pt continues to demonstrate some tenderness over pes anserine, as well as palpation of IT band, adductors. Within session pt made most change with knee stretch. PT strongly encouraged pt to minimize walking over the weekend to allow for healing and decr. reactivity to occur. PT answered multiple questions regarding whether pt needs to have MRI, encouraged pt that with focus on PT for two weeks if her pain is unchanging PT will recommend return to MD    Pt will benefit from skilled therapeutic intervention in order to improve on the following deficits Pain;Decreased activity tolerance;Decreased strength;Difficulty walking   Clinical Impairments Affecting Rehab Potential Good response to initial tx session and acuteness of complaints.    PT Frequency 2x / week   PT Duration 4 weeks   PT  Treatment/Interventions Cryotherapy;Therapeutic exercise;Therapeutic activities;Manual techniques;Taping;Gait training   PT Next Visit Plan Progress quadricep stretching, IASTM of pes anserineworking proximal along tendons of semitendinosis, gracilis, and sartorius. Patellar and tibia mobilizations as indicated.         Problem List Patient Active Problem List   Diagnosis Date Noted  . Left knee pain 08/17/2015  . Stress 03/23/2015  . Health care maintenance 12/24/2014  . History of non anemic vitamin B12 deficiency 12/23/2014  . Vitamin D deficiency 12/23/2014  . Rib pain on right side 12/23/2014  . Pseudoangiomatous stromal hyperplasia of breast 08/12/2014  . Obesity (BMI 30.0-34.9) 04/13/2014  . Abnormal liver function tests 04/13/2014  . Hypothyroid 11/12/2013  . Menopausal symptoms 11/12/2013  . Side pain 11/12/2013  . Fatigue 11/12/2013  . Daytime somnolence 11/12/2013  . Environmental allergies 11/12/2013  . Hypercholesterolemia 11/12/2013    Lameeka Schleifer PT DPT 09/04/2015, 4:34 PM  [place] [address] [place], [place], [address] Phone: [phone]   Fax:  [phone]  Name: Valinda Hoarlizabeth B Luhmann MRN: [id number] Date of Birth: 10/26/1960

## 2015-09-09 ENCOUNTER — Ambulatory Visit: Payer: BLUE CROSS/BLUE SHIELD | Admitting: Physical Therapy

## 2015-09-09 DIAGNOSIS — M6281 Muscle weakness (generalized): Secondary | ICD-10-CM

## 2015-09-09 DIAGNOSIS — M25562 Pain in left knee: Secondary | ICD-10-CM

## 2015-09-09 NOTE — Therapy (Signed)
[place] [facility] [address] [place], [place], [address] Phone: [phone]   Fax:  [phone]  Physical Therapy Treatment  Patient Details  Name: Carol Jimenez MRN: [id number] Date of Birth: 07/21/1960 No Data Recorded  Encounter Date: 09/09/2015    Past Medical History  Diagnosis Date  . Allergy   . Hypothyroidism   . Hx: UTI (urinary tract infection)     Past Surgical History  Procedure Laterality Date  . Cesarean section    . Endometrial ablation  2000  . Breast biopsy Left 2015    negative, stereotactic biopsy    There were no vitals filed for this visit.  Visit Diagnosis:  No diagnosis found.                Objective: STM performed on distal knee quad, adductors. 5 min. Pt reported decr. tightness following this.  Patellar mobs med/lat, quad tendon mobs, 3x1 min each in each direction.  Following this focused on standing hip flexor stretch - first taught pelvic rock, then with lunge position shift. Performed 3x10 with 5 sec. Holds performed B. Pt felt pulling in her knee with this.  Supine bridge focusing on hip flexor stretch and glute activation. Unable to add marching but otherwise done well.  Following this pt reported incr. Tightness in knee. Performed STM briefly on distal quad which reduced tightness.  Modified pt sitting posture in car to minimize pain [place].               PT Long Term Goals - 08/25/15 1756    PT LONG TERM GOAL #1   Title Patient will walk 20 minutes for her home program with no increase in pain to display return to prior level of activities.    Time 4   Period Weeks   Status New   PT LONG TERM GOAL #2   Title Patient will report an LEFS score of > 50/80 to demonstrate improved tolerance for completing ADLs.    Time 4   Period Weeks   Status New   PT LONG TERM GOAL #3   Title Patient will complete 5x sit to  stand with no increase in anterior knee pain to demonstrate improved tolerance for ADLs.   Baseline Pain with standing each attempt.    Time 4   Period Weeks   Status New               Problem List Patient Active Problem List   Diagnosis Date Noted  . Left knee pain 08/17/2015  . Stress 03/23/2015  . Health care maintenance 12/24/2014  . History of non anemic vitamin B12 deficiency 12/23/2014  . Vitamin D deficiency 12/23/2014  . Rib pain on right side 12/23/2014  . Pseudoangiomatous stromal hyperplasia of breast 08/12/2014  . Obesity (BMI 30.0-34.9) 04/13/2014  . Abnormal liver function tests 04/13/2014  . Hypothyroid 11/12/2013  . Menopausal symptoms 11/12/2013  . Side pain 11/12/2013  . Fatigue 11/12/2013  . Daytime somnolence 11/12/2013  . Environmental allergies 11/12/2013  . Hypercholesterolemia 11/12/2013    Caydence Koenig PT DPT 09/09/2015, 10:28 AM  [place] [facility] [address] [place], [place], [address] Phone: [phone]   Fax:  [phone]  Name: Carol Jimenez MRN: [id number] Date of Birth: 07/05/1960

## 2015-09-11 ENCOUNTER — Ambulatory Visit: Payer: BLUE CROSS/BLUE SHIELD | Admitting: Physical Therapy

## 2015-09-11 DIAGNOSIS — M25562 Pain in left knee: Secondary | ICD-10-CM

## 2015-09-11 DIAGNOSIS — M6281 Muscle weakness (generalized): Secondary | ICD-10-CM

## 2015-09-11 NOTE — Therapy (Signed)
[place] [facility] [address] [place], [address] Phone: [phone]   Fax:  [phone]  Physical Therapy Treatment  Patient Details  Name: Carol Jimenez MRN: [id number] Date of Birth: Dec 11, 1960 No Data Recorded  Encounter Date: 09/11/2015      PT End of Session - 09/11/15 1100    Visit Number 6   Number of Visits 9   Date for PT Re-Evaluation 09/22/15   PT Start Time 1032   PT Stop Time 1100   PT Time Calculation (min) 28 min   Activity Tolerance Patient tolerated treatment well   Behavior During Therapy [facility] for tasks assessed/performed      Past Medical History  Diagnosis Date  . Allergy   . Hypothyroidism   . Hx: UTI (urinary tract infection)     Past Surgical History  Procedure Laterality Date  . Cesarean section    . Endometrial ablation  2000  . Breast biopsy Left 2015    negative, stereotactic biopsy    There were no vitals filed for this visit.  Visit Diagnosis:  Left anterior knee pain  Muscle weakness      Subjective Assessment - 09/11/15 1057    Subjective Pt reports no pain. She reports she feels ready for d/c as she is no longer feeling pain.   Limitations Walking;Sitting   Diagnostic tests X-ray "Mild osteoarthritic change of the left knee centered on the medial compartment"   Patient Stated Goals To return to walking program for exercise.    Currently in Pain? No/denies   Pain Score 0-No pain             Objective: Assessed standing lunge stretch. Pt reported some pain with performance of lunge with LLE forward. Cued pt to avoid knee past toe and following this pt improved considerably with performance.  Wall sit 3x1 min with continued cuing to avoid knee past toe.  Reinforced HEP including stretches. Answered questions regarding performance of stretches, use of exercise bike.  Pt will email photo of exercise bike and PT will advise on whether to pursue  this. Encouraged pt to perform no more than 5 min every other day to monitor for pain.                    PT Education - 09/11/15 1059    Education provided Yes   Education Details d/c   Person(s) Educated Patient   Methods Explanation   Comprehension Verbalized understanding             PT Long Term Goals - 09/11/15 1101    PT LONG TERM GOAL #1   Title Patient will walk 20 minutes for her home program with no increase in pain to display return to prior level of activities.    Time 4   Period Weeks   Status Achieved   PT LONG TERM GOAL #2   Title Patient will report an LEFS score of > 50/80 to demonstrate improved tolerance for completing ADLs.    Time 4   Period Weeks   Status Achieved   PT LONG TERM GOAL #3   Title Patient will complete 5x sit to stand with no increase in anterior knee pain to demonstrate improved tolerance for ADLs.   Baseline Pain with standing each attempt.    Time 4   Period Weeks   Status Achieved  Plan - 09/11/15 1100    Clinical Impression Statement At this time pt is appropriate for d/c. She has met all pain goals, is strengthening independently and is now able to drive well while managing pain. Pt does have the potential for pain to return so PT encouraged pt to continue with stretching and strengthening HEP. Also encouraged pt to return for additional sessions as needed in the next few weeks if pain incr.   Pt will benefit from skilled therapeutic intervention in order to improve on the following deficits Pain;Decreased activity tolerance;Decreased strength;Difficulty walking   Rehab Potential Good   Clinical Impairments Affecting Rehab Potential Good response to initial tx session and acuteness of complaints.    PT Frequency 2x / week   PT Duration 4 weeks   PT Treatment/Interventions Cryotherapy;Therapeutic exercise;Therapeutic activities;Manual techniques;Taping;Gait training        Problem  List Patient Active Problem List   Diagnosis Date Noted  . Left knee pain 08/17/2015  . Stress 03/23/2015  . Health care maintenance 12/24/2014  . History of non anemic vitamin B12 deficiency 12/23/2014  . Vitamin D deficiency 12/23/2014  . Rib pain on right side 12/23/2014  . Pseudoangiomatous stromal hyperplasia of breast 08/12/2014  . Obesity (BMI 30.0-34.9) 04/13/2014  . Abnormal liver function tests 04/13/2014  . Hypothyroid 11/12/2013  . Menopausal symptoms 11/12/2013  . Side pain 11/12/2013  . Fatigue 11/12/2013  . Daytime somnolence 11/12/2013  . Environmental allergies 11/12/2013  . Hypercholesterolemia 11/12/2013    Marqual Mi PT DPT 09/11/2015, 11:03 AM  [place] [facility] [address], [place], [address] Phone: [phone]   Fax:  [phone]  Name: Carol Jimenez MRN: [id number] Date of Birth: 1960-12-26

## 2015-09-15 ENCOUNTER — Ambulatory Visit: Payer: BLUE CROSS/BLUE SHIELD | Admitting: Physical Therapy

## 2015-09-18 ENCOUNTER — Encounter: Payer: BLUE CROSS/BLUE SHIELD | Admitting: Physical Therapy

## 2015-09-30 ENCOUNTER — Ambulatory Visit: Payer: BLUE CROSS/BLUE SHIELD

## 2015-10-04 ENCOUNTER — Other Ambulatory Visit: Payer: Self-pay | Admitting: Internal Medicine

## 2015-10-07 ENCOUNTER — Encounter: Payer: Self-pay | Admitting: Internal Medicine

## 2015-10-07 ENCOUNTER — Ambulatory Visit (INDEPENDENT_AMBULATORY_CARE_PROVIDER_SITE_OTHER): Payer: BLUE CROSS/BLUE SHIELD

## 2015-10-07 DIAGNOSIS — M25562 Pain in left knee: Secondary | ICD-10-CM

## 2015-10-07 DIAGNOSIS — E538 Deficiency of other specified B group vitamins: Secondary | ICD-10-CM

## 2015-10-07 MED ORDER — CYANOCOBALAMIN 1000 MCG/ML IJ SOLN
1000.0000 ug | Freq: Once | INTRAMUSCULAR | Status: AC
  Administered 2015-10-07: 1000 ug via INTRAMUSCULAR

## 2015-10-07 NOTE — Progress Notes (Signed)
Patient was in today receiving a b12 in the right deltoid. Patient tolerated well.

## 2015-10-07 NOTE — Telephone Encounter (Signed)
Order placed for f/u B12 

## 2015-10-08 NOTE — Telephone Encounter (Signed)
Order placed for ortho referral.  Pt notified via my chart.   °

## 2015-10-08 NOTE — Addendum Note (Signed)
Addended by: Charm BargesSCOTT, Huberta Tompkins S on: 10/08/2015 05:31 AM   Modules accepted: Orders

## 2015-10-29 ENCOUNTER — Encounter: Payer: Self-pay | Admitting: Internal Medicine

## 2015-10-29 ENCOUNTER — Other Ambulatory Visit (INDEPENDENT_AMBULATORY_CARE_PROVIDER_SITE_OTHER): Payer: BLUE CROSS/BLUE SHIELD

## 2015-10-29 DIAGNOSIS — E538 Deficiency of other specified B group vitamins: Secondary | ICD-10-CM

## 2015-10-29 DIAGNOSIS — R7989 Other specified abnormal findings of blood chemistry: Secondary | ICD-10-CM | POA: Diagnosis not present

## 2015-10-29 DIAGNOSIS — E7800 Pure hypercholesterolemia, unspecified: Secondary | ICD-10-CM | POA: Diagnosis not present

## 2015-10-29 LAB — HEPATIC FUNCTION PANEL
ALBUMIN: 4.2 g/dL (ref 3.5–5.2)
ALK PHOS: 59 U/L (ref 39–117)
ALT: 31 U/L (ref 0–35)
AST: 29 U/L (ref 0–37)
Bilirubin, Direct: 0.1 mg/dL (ref 0.0–0.3)
TOTAL PROTEIN: 7.3 g/dL (ref 6.0–8.3)
Total Bilirubin: 0.5 mg/dL (ref 0.2–1.2)

## 2015-10-29 LAB — BASIC METABOLIC PANEL
BUN: 14 mg/dL (ref 6–23)
CO2: 23 meq/L (ref 19–32)
Calcium: 9.7 mg/dL (ref 8.4–10.5)
Chloride: 102 mEq/L (ref 96–112)
Creatinine, Ser: 0.74 mg/dL (ref 0.40–1.20)
GFR: 86.39 mL/min (ref >60.00)
GLUCOSE: 102 mg/dL — AB (ref 70–99)
POTASSIUM: 4.1 meq/L (ref 3.5–5.1)
SODIUM: 141 meq/L (ref 135–145)

## 2015-10-29 LAB — VITAMIN B12: Vitamin B-12: 433 pg/mL (ref 211–911)

## 2015-10-29 LAB — LIPID PANEL
Cholesterol: 217 mg/dL — ABNORMAL HIGH (ref 0–200)
HDL: 57 mg/dL (ref >39.00)
NONHDL: 160.06
Total CHOL/HDL Ratio: 4
Triglycerides: 217 mg/dL — ABNORMAL HIGH (ref 0.0–149.0)
VLDL: 43.4 mg/dL — ABNORMAL HIGH (ref 0.0–40.0)

## 2015-10-29 LAB — LDL CHOLESTEROL, DIRECT: Direct LDL: 146 mg/dL

## 2015-11-03 ENCOUNTER — Ambulatory Visit: Payer: BLUE CROSS/BLUE SHIELD | Admitting: Internal Medicine

## 2015-11-06 ENCOUNTER — Ambulatory Visit (INDEPENDENT_AMBULATORY_CARE_PROVIDER_SITE_OTHER): Payer: BLUE CROSS/BLUE SHIELD | Admitting: Internal Medicine

## 2015-11-06 ENCOUNTER — Encounter: Payer: Self-pay | Admitting: Internal Medicine

## 2015-11-06 VITALS — BP 120/80 | HR 77 | Temp 98.1°F | Resp 18 | Ht 66.0 in | Wt 199.5 lb

## 2015-11-06 DIAGNOSIS — N62 Hypertrophy of breast: Secondary | ICD-10-CM

## 2015-11-06 DIAGNOSIS — Z658 Other specified problems related to psychosocial circumstances: Secondary | ICD-10-CM

## 2015-11-06 DIAGNOSIS — Z8639 Personal history of other endocrine, nutritional and metabolic disease: Secondary | ICD-10-CM

## 2015-11-06 DIAGNOSIS — R400 Somnolence: Secondary | ICD-10-CM

## 2015-11-06 DIAGNOSIS — R7989 Other specified abnormal findings of blood chemistry: Secondary | ICD-10-CM

## 2015-11-06 DIAGNOSIS — R945 Abnormal results of liver function studies: Secondary | ICD-10-CM

## 2015-11-06 DIAGNOSIS — E039 Hypothyroidism, unspecified: Secondary | ICD-10-CM

## 2015-11-06 DIAGNOSIS — E669 Obesity, unspecified: Secondary | ICD-10-CM | POA: Diagnosis not present

## 2015-11-06 DIAGNOSIS — N6489 Other specified disorders of breast: Secondary | ICD-10-CM

## 2015-11-06 DIAGNOSIS — N951 Menopausal and female climacteric states: Secondary | ICD-10-CM | POA: Diagnosis not present

## 2015-11-06 DIAGNOSIS — E7800 Pure hypercholesterolemia, unspecified: Secondary | ICD-10-CM

## 2015-11-06 DIAGNOSIS — F439 Reaction to severe stress, unspecified: Secondary | ICD-10-CM

## 2015-11-06 DIAGNOSIS — G4710 Hypersomnia, unspecified: Secondary | ICD-10-CM

## 2015-11-06 NOTE — Progress Notes (Signed)
Patient ID: Carol Jimenez, female   DOB: 08/06/1960, 55 y.o.   MRN: [id number]   Subjective:    Patient ID: Carol Jimenez, female    DOB: 27-Sep-1960, 55 y.o.   MRN: [id number]  HPI  Patient here for a scheduled follow up.  She feels the effexor has leveled things out.  Still feels hot and is sweating intermittently.  The effexor has not helped with this.  She is on estrogen.  We discussed increasing the dose to one whole tablet per day.  No chest pain or tightness.  No sob.  No acid reflux.  No abdominal pain or cramping.  Bowels stable.  Does report feeling fatigued.  Wakes up not rested.  Snoring.  Concern over the possibility of sleep apnea.  Discussed cholesterol labs.  She wants to continue to work on diet and exercise.  Desires not to start cholesterol medication.     Past Medical History  Diagnosis Date  . Allergy   . Hypothyroidism   . Hx: UTI (urinary tract infection)    Past Surgical History  Procedure Laterality Date  . Cesarean section    . Endometrial ablation  2000  . Breast biopsy Left 2015    negative, stereotactic biopsy   Family History  Problem Relation Age of Onset  . Congestive Heart Failure Mother   . Diabetes Mother   . Hypertension Father   . Colon polyps    . Esophageal cancer Father 31    [facility] at [facility]  . COPD Mother    Social History   Social History  . Marital Status: Married    Spouse Name: N/A  . Number of Children: N/A  . Years of Education: N/A   Social History Main Topics  . Smoking status: Never Smoker   . Smokeless tobacco: Never Used  . Alcohol Use: 0.0 oz/week    0 Standard drinks or equivalent per week     Comment: rare  . Drug Use: No  . Sexual Activity: Not Asked   Other Topics Concern  . None   Social History Narrative    Outpatient Encounter Prescriptions as of 11/06/2015  Medication Sig  . ARMOUR THYROID 90 MG tablet take 1 tablet by mouth once daily  . estradiol (ESTRACE) 0.5 MG tablet take 1-2 tablets by  mouth once daily  . fluocinonide cream (LIDEX) 0.05 % as needed.   . fluorouracil (EFUDEX) 5 % cream apply to affected area ON LEFT CHEEK TWICE A DAY FOR 4 WEEKS  . hydrochlorothiazide (HYDRODIURIL) 25 MG tablet take 1 tablet by mouth once daily  . meloxicam (MOBIC) 15 MG tablet Take 15 mg by mouth daily.  . montelukast (SINGULAIR) 10 MG tablet Take 10 mg by mouth at bedtime.  . progesterone (PROMETRIUM) 100 MG capsule take 1 capsule by mouth once daily  . venlafaxine XR (EFFEXOR-XR) 37.5 MG 24 hr capsule take 1 capsule by mouth once daily with BREAKFAST   No facility-administered encounter medications on file as of 11/06/2015.   Review of Systems  Constitutional: Positive for fatigue. Negative for appetite change and unexpected weight change.  HENT: Negative for congestion and sinus pressure.   Respiratory: Negative for cough, chest tightness and shortness of breath.   Cardiovascular: Negative for chest pain, palpitations and leg swelling.  Gastrointestinal: Negative for nausea, vomiting, abdominal pain and diarrhea.  Genitourinary: Negative for dysuria and difficulty urinating.  Musculoskeletal: Negative for back pain and joint swelling.  Skin: Negative for color change and rash.  Neurological: Negative for dizziness, light-headedness and headaches.  Psychiatric/Behavioral: Negative for dysphoric mood and agitation.       Objective:     Blood pressure rechecked by me:  114/72  Physical Exam  Constitutional: She appears well-developed and well-nourished. No distress.  HENT:  Nose: Nose normal.  Mouth/Throat: Oropharynx is clear and moist.  Neck: Neck supple. No thyromegaly present.  Cardiovascular: Normal rate and regular rhythm.   Pulmonary/Chest: Breath sounds normal. No respiratory distress. She has no wheezes.  Abdominal: Soft. Bowel sounds are normal. There is no tenderness.  Musculoskeletal: She exhibits no edema or tenderness.  Lymphadenopathy:    She has no cervical  adenopathy.  Skin: No rash noted. No erythema.  Psychiatric: She has a normal mood and affect. Her behavior is normal.    BP 120/80 mmHg  Pulse 77  Temp(Src) 98.1 F (36.7 C) (Oral)  Resp 18  Ht  (1.676 m)  Wt 199 lb 8 oz (90.493 kg)  BMI 32.22 kg/m2  SpO2 96%  LMP 12/26/2012 Wt Readings from Last 3 Encounters:  11/06/15 199 lb 8 oz (90.493 kg)  08/15/15 199 lb 12 oz (90.606 kg)  05/07/15 199 lb (90.266 kg)     Lab Results  Component Value Date   WBC 6.1 05/05/2015   HGB 15.0 05/05/2015   HCT 44.0 05/05/2015   PLT 211.0 05/05/2015   GLUCOSE 102* 10/29/2015   CHOL 217* 10/29/2015   TRIG 217.0* 10/29/2015   HDL 57.00 10/29/2015   LDLDIRECT 146.0 10/29/2015   ALT 31 10/29/2015   AST 29 10/29/2015   NA 141 10/29/2015   K 4.1 10/29/2015   CL 102 10/29/2015   CREATININE 0.74 10/29/2015   BUN 14 10/29/2015   CO2 23 10/29/2015   TSH 1.05 05/05/2015   HGBA1C 5.6 10/30/2014    Dg Knee 1-2 Views Left  08/15/2015  CLINICAL DATA:  Left knee pain, inability to bear weight prolonged period of time, discomfort when walking. No report of injury. EXAM: LEFT KNEE - 1-2 VIEW COMPARISON:  None in PACs FINDINGS: The bones of the left knee are adequately mineralized. There is mild narrowing of the medial joint compartment. There is beaking of the tibial spines. A tiny spur arises from the superior articular margin of the patella. There is no joint effusion. No acute fracture or dislocation is observed. IMPRESSION: Mild osteoarthritic change of the left knee centered on the medial joint compartment. If the patient's symptoms warrant further evaluation, MRI would be a useful next step to exclude internal derangement. Electronically Signed   By: David  [place] M.D.   On: 08/15/2015 13:02       Assessment & Plan:   Problem List Items Addressed This Visit    Abnormal liver function tests    Follow liver function tests.        Relevant Orders   Hepatic function panel   Daytime  somnolence    With daytime somnolence, snoring and not feeling rested when awakens.  Schedule split night sleep study.        Relevant Orders   Ambulatory referral to Sleep Studies   History of non anemic vitamin B12 deficiency    B12 level reviewed.  Start b12 q day (oral).  Will hold on injections.       Hypercholesterolemia    Dicussed cholesterol lab results.  Low cholesterol diet and exercise.  Follow lipid panel.        Relevant Orders   Lipid panel  Basic metabolic panel   Hypothyroid    On thyroid replacement.  Follow tsh.       Relevant Orders   TSH   Menopausal symptoms    Will increase estrogen to one whole tablet per day.  Follow.  Increased sweating and feeling hot.  Follow.        Obesity (BMI 30.0-34.9)    Discussed diet and exercise.       Pseudoangiomatous stromal hyperplasia of breast    Mammogram 03/2015 - birads I.       Stress - Primary    Doing better on effexor.  Follow.           Dale DurhamSCOTT, Karl Knarr, MD

## 2015-11-06 NOTE — Progress Notes (Signed)
Pre-visit discussion using our clinic review tool. No additional management support is needed unless otherwise documented below in the visit note.  

## 2015-11-09 ENCOUNTER — Encounter: Payer: Self-pay | Admitting: Internal Medicine

## 2015-11-09 NOTE — Assessment & Plan Note (Signed)
Dicussed cholesterol lab results.  Low cholesterol diet and exercise.  Follow lipid panel.

## 2015-11-09 NOTE — Assessment & Plan Note (Signed)
On thyroid replacement.  Follow tsh.  

## 2015-11-09 NOTE — Assessment & Plan Note (Signed)
Will increase estrogen to one whole tablet per day.  Follow.  Increased sweating and feeling hot.  Follow.

## 2015-11-09 NOTE — Assessment & Plan Note (Signed)
B12 level reviewed.  Start b12 q day (oral).  Will hold on injections.

## 2015-11-09 NOTE — Assessment & Plan Note (Signed)
With daytime somnolence, snoring and not feeling rested when awakens.  Schedule split night sleep study.

## 2015-11-09 NOTE — Assessment & Plan Note (Signed)
Doing better on effexor.  Follow.  

## 2015-11-09 NOTE — Assessment & Plan Note (Signed)
Discussed diet and exercise 

## 2015-11-09 NOTE — Assessment & Plan Note (Signed)
Mammogram 03/2015 - birads I.

## 2015-11-09 NOTE — Assessment & Plan Note (Signed)
Follow liver function tests.   

## 2015-11-11 ENCOUNTER — Ambulatory Visit: Payer: BLUE CROSS/BLUE SHIELD

## 2015-11-14 ENCOUNTER — Other Ambulatory Visit: Payer: Self-pay | Admitting: Internal Medicine

## 2015-11-28 ENCOUNTER — Encounter: Payer: Self-pay | Admitting: *Deleted

## 2015-12-04 ENCOUNTER — Encounter: Payer: Self-pay | Admitting: Internal Medicine

## 2015-12-04 IMAGING — MG MM MAMMO DIAGNOSTIC UNILATERAL*L*
2 series · 2 of 2 positions shown · non-contrast
Comparison: Previous exams.

CLINICAL DATA: The patient presents for a six-month followup
diagnostic left breast mammogram post benign biopsy demonstrating
PASH with sclerosing adenosis and microcyst formation.

EXAM:
DIGITAL DIAGNOSTIC left MAMMOGRAM

[L MLO]
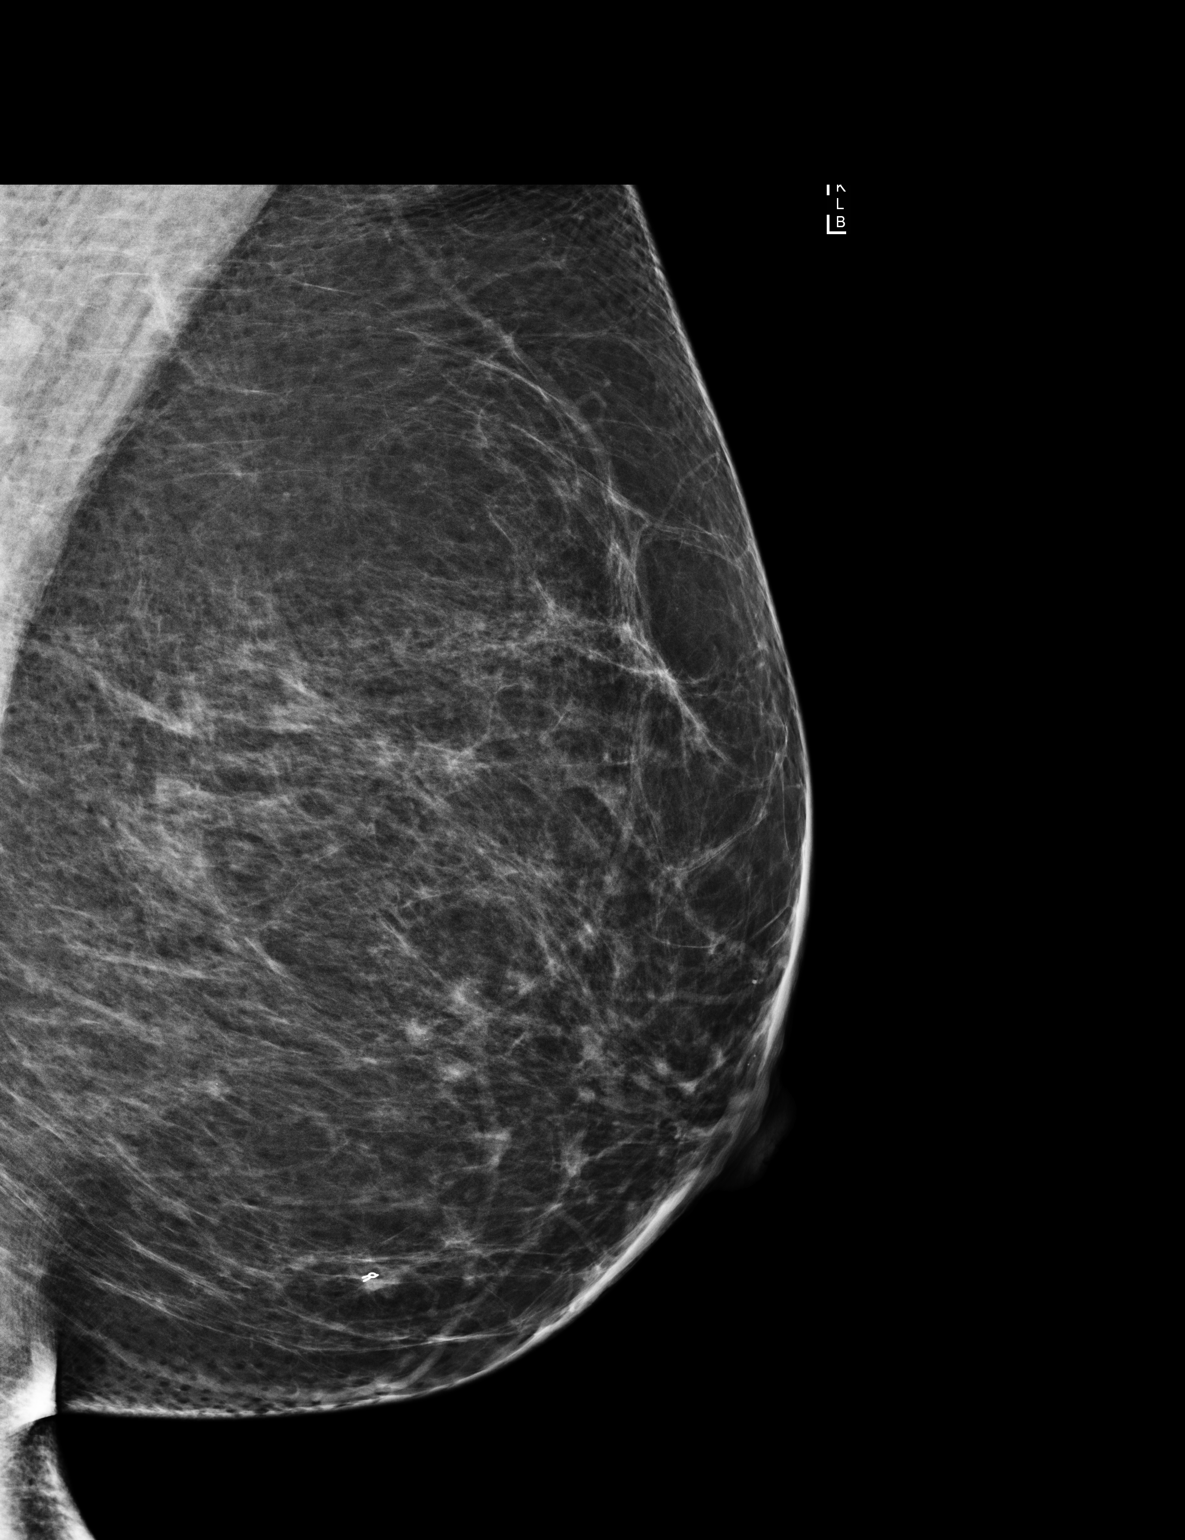

[L CC]
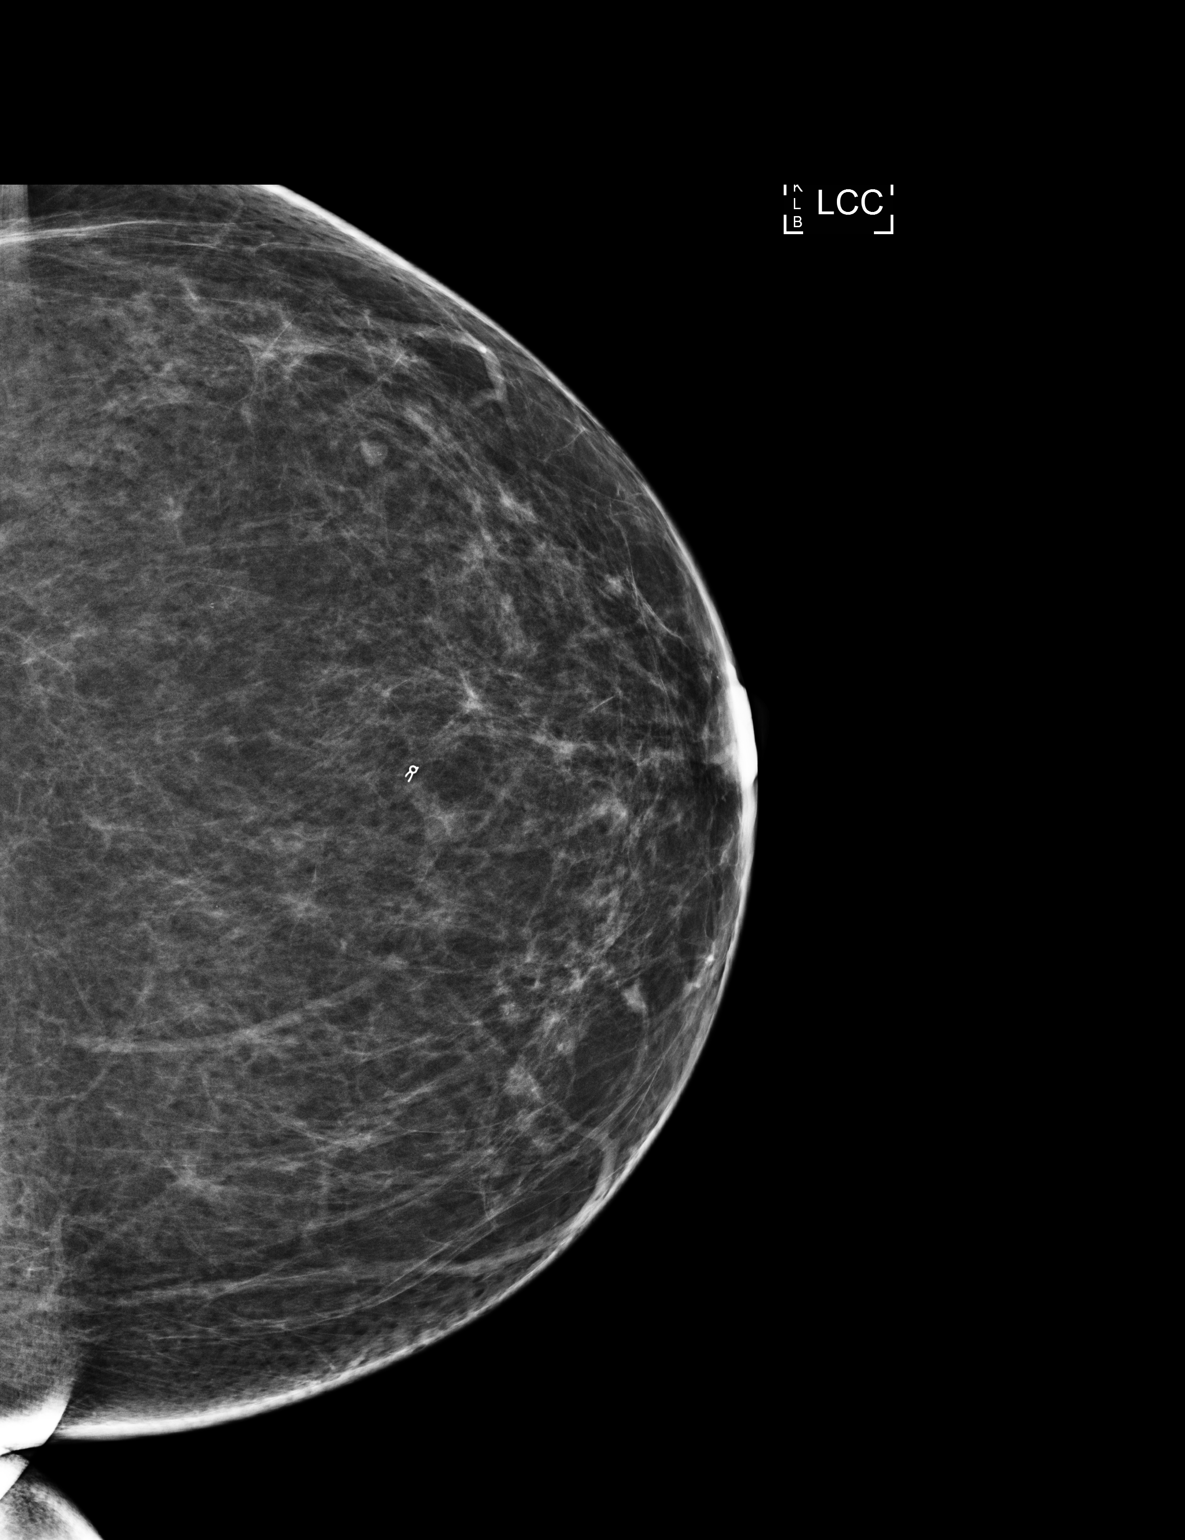

[2 of 2 positions shown; findings below may reference images not displayed]

ACR Breast Density Category b: There are scattered areas of
fibroglandular density.
FINDINGS: Examination demonstrates a metallic clip over the benign biopsy site
over the lower central left breast. The remainder of the exam is
unchanged.
IMPRESSION: Post biopsy changes over the lower central left breast.

RECOMMENDATION:
Recommend continued annual bilateral screening mammographic
followup.

I have discussed the findings and recommendations with the patient.
Results were also provided in writing at the conclusion of the
visit. If applicable, a reminder letter will be sent to the patient
regarding the next appointment.

BI-RADS CATEGORY  2: Benign.

## 2015-12-31 ENCOUNTER — Other Ambulatory Visit: Payer: Self-pay | Admitting: Internal Medicine

## 2016-04-03 ENCOUNTER — Other Ambulatory Visit: Payer: Self-pay | Admitting: Internal Medicine

## 2016-04-05 ENCOUNTER — Other Ambulatory Visit (INDEPENDENT_AMBULATORY_CARE_PROVIDER_SITE_OTHER): Payer: BLUE CROSS/BLUE SHIELD

## 2016-04-05 ENCOUNTER — Encounter: Payer: Self-pay | Admitting: Internal Medicine

## 2016-04-05 DIAGNOSIS — R945 Abnormal results of liver function studies: Secondary | ICD-10-CM

## 2016-04-05 DIAGNOSIS — E039 Hypothyroidism, unspecified: Secondary | ICD-10-CM | POA: Diagnosis not present

## 2016-04-05 DIAGNOSIS — R7989 Other specified abnormal findings of blood chemistry: Secondary | ICD-10-CM | POA: Diagnosis not present

## 2016-04-05 DIAGNOSIS — E7800 Pure hypercholesterolemia, unspecified: Secondary | ICD-10-CM

## 2016-04-05 LAB — BASIC METABOLIC PANEL
BUN: 13 mg/dL (ref 6–23)
CALCIUM: 9.2 mg/dL (ref 8.4–10.5)
CO2: 29 meq/L (ref 19–32)
CREATININE: 0.6 mg/dL (ref 0.40–1.20)
Chloride: 102 mEq/L (ref 96–112)
GFR: 109.88 mL/min (ref >60.00)
GLUCOSE: 113 mg/dL — AB (ref 70–99)
Potassium: 3.9 mEq/L (ref 3.5–5.1)
SODIUM: 139 meq/L (ref 135–145)

## 2016-04-05 LAB — LIPID PANEL
CHOLESTEROL: 182 mg/dL (ref 0–200)
HDL: 47.7 mg/dL (ref >39.00)
LDL Cholesterol: 97 mg/dL (ref 0–99)
NonHDL: 134.47
TRIGLYCERIDES: 187 mg/dL — AB (ref 0.0–149.0)
Total CHOL/HDL Ratio: 4
VLDL: 37.4 mg/dL (ref 0.0–40.0)

## 2016-04-05 LAB — HEPATIC FUNCTION PANEL
ALK PHOS: 57 U/L (ref 39–117)
ALT: 33 U/L (ref 0–35)
AST: 35 U/L (ref 0–37)
Albumin: 3.9 g/dL (ref 3.5–5.2)
BILIRUBIN DIRECT: 0.1 mg/dL (ref 0.0–0.3)
BILIRUBIN TOTAL: 0.4 mg/dL (ref 0.2–1.2)
TOTAL PROTEIN: 6.5 g/dL (ref 6.0–8.3)

## 2016-04-05 LAB — TSH: TSH: 2.09 u[IU]/mL (ref 0.35–4.50)

## 2016-04-08 ENCOUNTER — Encounter: Payer: BLUE CROSS/BLUE SHIELD | Admitting: Internal Medicine

## 2016-04-08 ENCOUNTER — Encounter: Payer: Self-pay | Admitting: Internal Medicine

## 2016-04-08 ENCOUNTER — Ambulatory Visit (INDEPENDENT_AMBULATORY_CARE_PROVIDER_SITE_OTHER): Payer: BLUE CROSS/BLUE SHIELD | Admitting: Internal Medicine

## 2016-04-08 VITALS — BP 150/90 | HR 72 | Temp 98.8°F | Ht 66.0 in | Wt 208.0 lb

## 2016-04-08 DIAGNOSIS — R7989 Other specified abnormal findings of blood chemistry: Secondary | ICD-10-CM

## 2016-04-08 DIAGNOSIS — Z8639 Personal history of other endocrine, nutritional and metabolic disease: Secondary | ICD-10-CM

## 2016-04-08 DIAGNOSIS — Z0000 Encounter for general adult medical examination without abnormal findings: Secondary | ICD-10-CM | POA: Diagnosis not present

## 2016-04-08 DIAGNOSIS — N62 Hypertrophy of breast: Secondary | ICD-10-CM | POA: Diagnosis not present

## 2016-04-08 DIAGNOSIS — F439 Reaction to severe stress, unspecified: Secondary | ICD-10-CM | POA: Diagnosis not present

## 2016-04-08 DIAGNOSIS — N951 Menopausal and female climacteric states: Secondary | ICD-10-CM

## 2016-04-08 DIAGNOSIS — E7800 Pure hypercholesterolemia, unspecified: Secondary | ICD-10-CM

## 2016-04-08 DIAGNOSIS — Z23 Encounter for immunization: Secondary | ICD-10-CM

## 2016-04-08 DIAGNOSIS — E039 Hypothyroidism, unspecified: Secondary | ICD-10-CM

## 2016-04-08 DIAGNOSIS — Z1239 Encounter for other screening for malignant neoplasm of breast: Secondary | ICD-10-CM | POA: Diagnosis not present

## 2016-04-08 DIAGNOSIS — E669 Obesity, unspecified: Secondary | ICD-10-CM

## 2016-04-08 DIAGNOSIS — R945 Abnormal results of liver function studies: Secondary | ICD-10-CM

## 2016-04-08 DIAGNOSIS — N6489 Other specified disorders of breast: Secondary | ICD-10-CM

## 2016-04-08 NOTE — Progress Notes (Signed)
Pre visit review using our clinic review tool, if applicable. No additional management support is needed unless otherwise documented below in the visit note. 

## 2016-04-08 NOTE — Assessment & Plan Note (Addendum)
Physical today 04/08/16.  Colonoscopy 2012.  Mammogram scheduled today.  Contact GI - when due for f/u colonoscopy.

## 2016-04-08 NOTE — Progress Notes (Signed)
Patient ID: Carol Jimenez, female   DOB: 05/19/1961, 55 y.o.   MRN: [id number]   Subjective:    Patient ID: Carol Jimenez, female    DOB: 02/02/1961, 55 y.o.   MRN: [id number]  HPI  Patient here for her physical exam.  She is doing well.  Tries to stay active.  No chest pain.  No sob.  No acid reflux.  No abdominal pain or cramping.  Bowels stable.  Dr Marva PandaSkulskie did her last colonoscopy.  Not sure when due for f/u colonoscopy.  Reviewed labs.  LDL 97.  Discussed diet and exercise.     Past Medical History:  Diagnosis Date  . Allergy   . Hx: UTI (urinary tract infection)   . Hypothyroidism    Past Surgical History:  Procedure Laterality Date  . BREAST BIOPSY Left 2015   negative, stereotactic biopsy  . CESAREAN SECTION    . ENDOMETRIAL ABLATION  2000   Family History  Problem Relation Age of Onset  . Congestive Heart Failure Mother   . Diabetes Mother   . COPD Mother   . Hypertension Father   . Esophageal cancer Father 2160    [facility]  . Colon polyps     Social History   Social History  . Marital status: Married    Spouse name: N/A  . Number of children: N/A  . Years of education: N/A   Social History Main Topics  . Smoking status: Never Smoker  . Smokeless tobacco: Never Used  . Alcohol use 0.0 oz/week     Comment: rare  . Drug use: No  . Sexual activity: Not Asked   Other Topics Concern  . None   Social History Narrative  . None    Outpatient Encounter Prescriptions as of 04/08/2016  Medication Sig  . ARMOUR THYROID 90 MG tablet take 1 tablet by mouth once daily  . estradiol (ESTRACE) 0.5 MG tablet take 1-2 tablets by mouth once daily  . hydrochlorothiazide (HYDRODIURIL) 25 MG tablet take 1 tablet by mouth once daily  . meloxicam (MOBIC) 15 MG tablet Take 15 mg by mouth daily.  . progesterone (PROMETRIUM) 100 MG capsule take 1 capsule by mouth once daily  . venlafaxine XR (EFFEXOR-XR) 37.5 MG 24 hr capsule take 1 capsule by mouth once daily  with BREAKFAST  . [DISCONTINUED] fluocinonide cream (LIDEX) 0.05 % as needed.   . [DISCONTINUED] fluorouracil (EFUDEX) 5 % cream apply to affected area ON LEFT CHEEK TWICE A DAY FOR 4 WEEKS  . [DISCONTINUED] montelukast (SINGULAIR) 10 MG tablet Take 10 mg by mouth at bedtime.   No facility-administered encounter medications on file as of 04/08/2016.     Review of Systems  Constitutional: Negative for appetite change and unexpected weight change.  HENT: Negative for congestion and sinus pressure.   Eyes: Negative for pain and visual disturbance.  Respiratory: Negative for cough, chest tightness and shortness of breath.   Cardiovascular: Negative for chest pain, palpitations and leg swelling.  Gastrointestinal: Negative for abdominal pain, diarrhea, nausea and vomiting.  Genitourinary: Negative for difficulty urinating and dysuria.  Musculoskeletal: Negative for back pain and joint swelling.  Skin: Negative for color change and rash.  Neurological: Negative for dizziness, light-headedness and headaches.  Hematological: Negative for adenopathy. Does not bruise/bleed easily.  Psychiatric/Behavioral: Negative for agitation and dysphoric mood.       Objective:     Blood pressure rechecked by me:  [phone]/[id number]/84  Physical Exam  Constitutional: She is  oriented to person, place, and time. She appears well-developed and well-nourished. No distress.  HENT:  Nose: Nose normal.  Mouth/Throat: Oropharynx is clear and moist.  Eyes: Right eye exhibits no discharge. Left eye exhibits no discharge. No scleral icterus.  Neck: Neck supple. No thyromegaly present.  Cardiovascular: Normal rate and regular rhythm.   Pulmonary/Chest: Breath sounds normal. No accessory muscle usage. No tachypnea. No respiratory distress. She has no decreased breath sounds. She has no wheezes. She has no rhonchi. Right breast exhibits no inverted nipple, no mass, no nipple discharge and no tenderness (no axillary adenopathy).  Left breast exhibits no inverted nipple, no mass, no nipple discharge and no tenderness (no axilarry adenopathy).  Abdominal: Soft. Bowel sounds are normal. There is no tenderness.  Musculoskeletal: She exhibits no edema or tenderness.  Lymphadenopathy:    She has no cervical adenopathy.  Neurological: She is alert and oriented to person, place, and time.  Skin: Skin is warm. No rash noted. No erythema.  Psychiatric: She has a normal mood and affect. Her behavior is normal.    BP (!) 150/90   Pulse 72   Temp 98.8 F (37.1 C) (Oral)   Ht 5\' 6"  (1.676 m)   Wt 208 lb (94.3 kg)   LMP 12/26/2012   SpO2 96%   BMI 33.57 kg/m  Wt Readings from Last 3 Encounters:  04/08/16 208 lb (94.3 kg)  11/06/15 199 lb 8 oz (90.5 kg)  08/15/15 199 lb 12 oz (90.6 kg)     Lab Results  Component Value Date   WBC 6.1 05/05/2015   HGB 15.0 05/05/2015   HCT 44.0 05/05/2015   PLT 211.0 05/05/2015   GLUCOSE 113 (H) 04/05/2016   CHOL 182 04/05/2016   TRIG 187.0 (H) 04/05/2016   HDL 47.70 04/05/2016   LDLDIRECT 146.0 10/29/2015   LDLCALC 97 04/05/2016   ALT 33 04/05/2016   AST 35 04/05/2016   NA 139 04/05/2016   K 3.9 04/05/2016   CL 102 04/05/2016   CREATININE 0.60 04/05/2016   BUN 13 04/05/2016   CO2 29 04/05/2016   TSH 2.09 04/05/2016   HGBA1C 5.6 10/30/2014    Dg Knee 1-2 Views Left  Result Date: 08/15/2015 CLINICAL DATA:  Left knee pain, inability to bear weight prolonged period of time, discomfort when walking. No report of injury. EXAM: LEFT KNEE - 1-2 VIEW COMPARISON:  None in PACs FINDINGS: The bones of the left knee are adequately mineralized. There is mild narrowing of the medial joint compartment. There is beaking of the tibial spines. A tiny spur arises from the superior articular margin of the patella. There is no joint effusion. No acute fracture or dislocation is observed. IMPRESSION: Mild osteoarthritic change of the left knee centered on the medial joint compartment. If the  patient's symptoms warrant further evaluation, MRI would be a useful next step to exclude internal derangement. Electronically Signed   By: David  [place] M.D.   On: 08/15/2015 13:02       Assessment & Plan:   Problem List Items Addressed This Visit    Abnormal liver function tests    Follow liver function tests.  Recent check wnl.        Health care maintenance    Physical today 04/08/16.  Colonoscopy 2012.  Mammogram scheduled today.  Contact GI - when due for f/u colonoscopy.        History of non anemic vitamin B12 deficiency    On oral B12.  Follow.  Hypercholesterolemia    Low cholesterol diet and exercise.  Follow lipid panel.  Recent check LDL 97.        Hypothyroid    On thyroid replacement.  Follow tsh.        Menopausal symptoms    Improved.  Follow.  On estrogen.       Obesity (BMI 30.0-34.9)    Diet and exercise.  Follow.       Pseudoangiomatous stromal hyperplasia of breast    Mammogram 03/2015 - Birads I.  Scheduled for f/u mammogram.  Saw Dr Lemar Livings.        Stress    She reports feeling fatigued.  Was questioning if effexor is contributing.  Will need to change to decrease the dose some and see if symptoms improve.  If so, then may need to change medication to an SSRI.  Follow.  Call with update.         Other Visit Diagnoses    Encounter for breast cancer screening other than mammogram    -  Primary   Relevant Orders   MM Digital Screening   Encounter for immunization       Relevant Orders   Flu Vaccine QUAD 36+ mos IM (Completed)       Dale [place]loo, MD

## 2016-04-11 ENCOUNTER — Encounter: Payer: Self-pay | Admitting: Internal Medicine

## 2016-04-11 NOTE — Assessment & Plan Note (Signed)
Follow liver function tests.  Recent check wnl.

## 2016-04-11 NOTE — Assessment & Plan Note (Signed)
Improved.  Follow.  On estrogen.

## 2016-04-11 NOTE — Assessment & Plan Note (Signed)
Diet and exercise.  Follow.  

## 2016-04-11 NOTE — Assessment & Plan Note (Signed)
She reports feeling fatigued.  Was questioning if effexor is contributing.  Will need to change to decrease the dose some and see if symptoms improve.  If so, then may need to change medication to an SSRI.  Follow.  Call with update.

## 2016-04-11 NOTE — Assessment & Plan Note (Signed)
Mammogram 03/2015 - Birads I.  Scheduled for f/u mammogram.  Saw Dr Lemar LivingsByrnett.

## 2016-04-11 NOTE — Assessment & Plan Note (Signed)
On oral B12.  Follow.   

## 2016-04-11 NOTE — Assessment & Plan Note (Signed)
On thyroid replacement.  Follow tsh.  

## 2016-04-11 NOTE — Assessment & Plan Note (Signed)
Low cholesterol diet and exercise.  Follow lipid panel.  Recent check LDL 97.

## 2016-05-04 ENCOUNTER — Ambulatory Visit
Admission: RE | Admit: 2016-05-04 | Discharge: 2016-05-04 | Disposition: A | Discharge status: Home / Self Care | Payer: BLUE CROSS/BLUE SHIELD | Source: Ambulatory Visit | Attending: Internal Medicine | Admitting: Internal Medicine

## 2016-05-04 DIAGNOSIS — Z1239 Encounter for other screening for malignant neoplasm of breast: Secondary | ICD-10-CM

## 2016-05-04 DIAGNOSIS — Z1231 Encounter for screening mammogram for malignant neoplasm of breast: Secondary | ICD-10-CM | POA: Insufficient documentation

## 2016-05-14 ENCOUNTER — Other Ambulatory Visit: Payer: Self-pay | Admitting: Internal Medicine

## 2016-06-02 ENCOUNTER — Telehealth: Payer: Self-pay | Admitting: Internal Medicine

## 2016-06-02 DIAGNOSIS — Z1211 Encounter for screening for malignant neoplasm of colon: Secondary | ICD-10-CM

## 2016-06-02 NOTE — Telephone Encounter (Signed)
Sent pt a my chart message to inform her that she is due f/u colonoscopy.

## 2016-06-05 NOTE — Addendum Note (Signed)
Addended by: Charm BargesSCOTT, Dezirea Mccollister S on: 06/05/2016 05:25 PM   Modules accepted: Orders

## 2016-06-05 NOTE — Telephone Encounter (Signed)
Order placed for GI referral.   

## 2016-06-10 ENCOUNTER — Ambulatory Visit (INDEPENDENT_AMBULATORY_CARE_PROVIDER_SITE_OTHER): Payer: BLUE CROSS/BLUE SHIELD | Admitting: Internal Medicine

## 2016-06-10 ENCOUNTER — Encounter: Payer: Self-pay | Admitting: Internal Medicine

## 2016-06-10 DIAGNOSIS — R7989 Other specified abnormal findings of blood chemistry: Secondary | ICD-10-CM | POA: Diagnosis not present

## 2016-06-10 DIAGNOSIS — E7800 Pure hypercholesterolemia, unspecified: Secondary | ICD-10-CM

## 2016-06-10 DIAGNOSIS — E039 Hypothyroidism, unspecified: Secondary | ICD-10-CM

## 2016-06-10 DIAGNOSIS — F439 Reaction to severe stress, unspecified: Secondary | ICD-10-CM

## 2016-06-10 DIAGNOSIS — R739 Hyperglycemia, unspecified: Secondary | ICD-10-CM

## 2016-06-10 DIAGNOSIS — R945 Abnormal results of liver function studies: Secondary | ICD-10-CM

## 2016-06-10 NOTE — Progress Notes (Signed)
Patient ID: Carol Jimenez, female   DOB: [birth year], 55 y.o.   MRN: [id number]   Subjective:    Patient ID: Carol Jimenez, female    DOB: Aug 23, 1960, 55 y.o.   MRN: [id number]  HPI  Patient here for a scheduled follow up.  She reports that she has tried to taper the effexor.  She developed dizziness when she skipped one day.  She started taking the capsule apart and only taking 1/2 beads this week.  Still with some dizziness.  She describes as room spinning and certain positions aggravate.  She does report a previous history of vertigo, but has been years since had an episode of vertigo.  She states she had similar symptoms when coming off a similar medication previously.  She does want to be on something.  She likes the way the effexor has leveled things out.  Just makes her a little groggy. No chest pain.  No sob.  No acid reflux.  No abdominal pain or cramping.  Bowels stable.     Past Medical History:  Diagnosis Date  . Allergy   . Hx: UTI (urinary tract infection)   . Hypothyroidism    Past Surgical History:  Procedure Laterality Date  . BREAST BIOPSY Left 2015   negative, stereotactic biopsy  . CESAREAN SECTION    . ENDOMETRIAL ABLATION  2000   Family History  Problem Relation Age of Onset  . Congestive Heart Failure Mother   . Diabetes Mother   . COPD Mother   . Hypertension Father   . Esophageal cancer Father 32    treated at [facility]  . Colon polyps    . Breast cancer Neg Hx    Social History   Social History  . Marital status: Married    Spouse name: N/A  . Number of children: N/A  . Years of education: N/A   Social History Main Topics  . Smoking status: Never Smoker  . Smokeless tobacco: Never Used  . Alcohol use 0.0 oz/week     Comment: rare  . Drug use: No  . Sexual activity: Not Asked   Other Topics Concern  . None   Social History Narrative  . None    Outpatient Encounter Prescriptions as of 06/10/2016  Medication Sig  . ARMOUR THYROID 90 MG  tablet take 1 tablet by mouth once daily  . estradiol (ESTRACE) 0.5 MG tablet take 1-2 tablets by mouth once daily  . hydrochlorothiazide (HYDRODIURIL) 25 MG tablet take 1 tablet by mouth once daily  . meloxicam (MOBIC) 15 MG tablet Take 15 mg by mouth daily.  . progesterone (PROMETRIUM) 100 MG capsule take 1 capsule by mouth once daily  . venlafaxine XR (EFFEXOR-XR) 37.5 MG 24 hr capsule take 1 capsule by mouth once daily with BREAKFAST   No facility-administered encounter medications on file as of 06/10/2016.     Review of Systems  Constitutional: Negative for appetite change and unexpected weight change.  HENT: Negative for congestion and sinus pressure.   Respiratory: Negative for cough, chest tightness and shortness of breath.   Cardiovascular: Negative for chest pain, palpitations and leg swelling.  Gastrointestinal: Negative for abdominal pain, diarrhea, nausea and vomiting.  Genitourinary: Negative for difficulty urinating and dysuria.  Musculoskeletal: Negative for back pain and joint swelling.  Skin: Negative for color change and rash.  Neurological: Positive for dizziness. Negative for headaches.  Psychiatric/Behavioral: Negative for agitation and dysphoric mood.       Objective:  Physical Exam  Constitutional: She appears well-developed and well-nourished. No distress.  HENT:  Nose: Nose normal.  Mouth/Throat: Oropharynx is clear and moist.  Neck: Neck supple. No thyromegaly present.  Cardiovascular: Normal rate and regular rhythm.   Pulmonary/Chest: Breath sounds normal. No respiratory distress. She has no wheezes.  Abdominal: Soft. Bowel sounds are normal. There is no tenderness.  Musculoskeletal: She exhibits no edema or tenderness.  Lymphadenopathy:    She has no cervical adenopathy.  Skin: No rash noted. No erythema.  Psychiatric: She has a normal mood and affect. Her behavior is normal.    BP 130/80   Pulse 80   Wt 202 lb 9.6 oz (91.9 kg)   LMP  12/26/2012   BMI 32.70 kg/m  Wt Readings from Last 3 Encounters:  06/10/16 202 lb 9.6 oz (91.9 kg)  04/08/16 208 lb (94.3 kg)  11/06/15 199 lb 8 oz (90.5 kg)     Lab Results  Component Value Date   WBC 6.1 05/05/2015   HGB 15.0 05/05/2015   HCT 44.0 05/05/2015   PLT 211.0 05/05/2015   GLUCOSE 113 (H) 04/05/2016   CHOL 182 04/05/2016   TRIG 187.0 (H) 04/05/2016   HDL 47.70 04/05/2016   LDLDIRECT 146.0 10/29/2015   LDLCALC 97 04/05/2016   ALT 33 04/05/2016   AST 35 04/05/2016   NA 139 04/05/2016   K 3.9 04/05/2016   CL 102 04/05/2016   CREATININE 0.60 04/05/2016   BUN 13 04/05/2016   CO2 29 04/05/2016   TSH 2.09 04/05/2016   HGBA1C 5.6 10/30/2014    Mm Digital Screening  Result Date: 05/05/2016 CLINICAL DATA:  Screening. EXAM: DIGITAL SCREENING BILATERAL MAMMOGRAM WITH CAD COMPARISON:  Previous exam(s). ACR Breast Density Category b: There are scattered areas of fibroglandular density. FINDINGS: There are no findings suspicious for malignancy. Images were processed with CAD. IMPRESSION: No mammographic evidence of malignancy. A result letter of this screening mammogram will be mailed directly to the patient. RECOMMENDATION: Screening mammogram in one year. (Code:SM-B-01Y) BI-RADS CATEGORY  1: Negative. Electronically Signed   By: Nolon Nations M.D.   On: 05/05/2016 09:50       Assessment & Plan:   Problem List Items Addressed This Visit    Abnormal liver function tests    Follow liver panel.        Hypercholesterolemia    Low cholesterol diet and exercise.  Follow lipid panel.        Hyperglycemia    Low carb diet and exercise.  Follow met b and a1c.        Hypothyroid    On armour thyroid.  Follow tsh.       Stress    On effexor.  Trying to taper.  Having dizziness as outlined.  Will restart daily effexor.  See if symptoms resolve.  Then will attempt gradual taper as discussed with her today.            Einar Pheasant, MD

## 2016-06-13 ENCOUNTER — Encounter: Payer: Self-pay | Admitting: Internal Medicine

## 2016-06-13 DIAGNOSIS — R739 Hyperglycemia, unspecified: Secondary | ICD-10-CM | POA: Insufficient documentation

## 2016-06-13 NOTE — Assessment & Plan Note (Signed)
On armour thyroid.  Follow tsh.   

## 2016-06-13 NOTE — Assessment & Plan Note (Signed)
Low carb diet and exercise.  Follow met b and a1c.   

## 2016-06-13 NOTE — Assessment & Plan Note (Signed)
On effexor.  Trying to taper.  Having dizziness as outlined.  Will restart daily effexor.  See if symptoms resolve.  Then will attempt gradual taper as discussed with her today.

## 2016-06-13 NOTE — Assessment & Plan Note (Signed)
Low cholesterol diet and exercise.  Follow lipid panel.   

## 2016-06-13 NOTE — Assessment & Plan Note (Signed)
Follow liver panel.  

## 2016-06-30 ENCOUNTER — Other Ambulatory Visit: Payer: Self-pay | Admitting: Internal Medicine

## 2016-07-08 ENCOUNTER — Encounter: Payer: Self-pay | Admitting: Internal Medicine

## 2016-07-16 ENCOUNTER — Other Ambulatory Visit: Payer: Self-pay | Admitting: Internal Medicine

## 2016-07-27 ENCOUNTER — Encounter: Payer: Self-pay | Admitting: Internal Medicine

## 2016-08-01 IMAGING — MG MM DIGITAL SCREENING BILAT W/ CAD
4 series · 4 of 4 positions shown · non-contrast
Comparison: Previous exam(s).

CLINICAL DATA: Screening.

EXAM:
DIGITAL SCREENING BILATERAL MAMMOGRAM WITH CAD

[R CC]
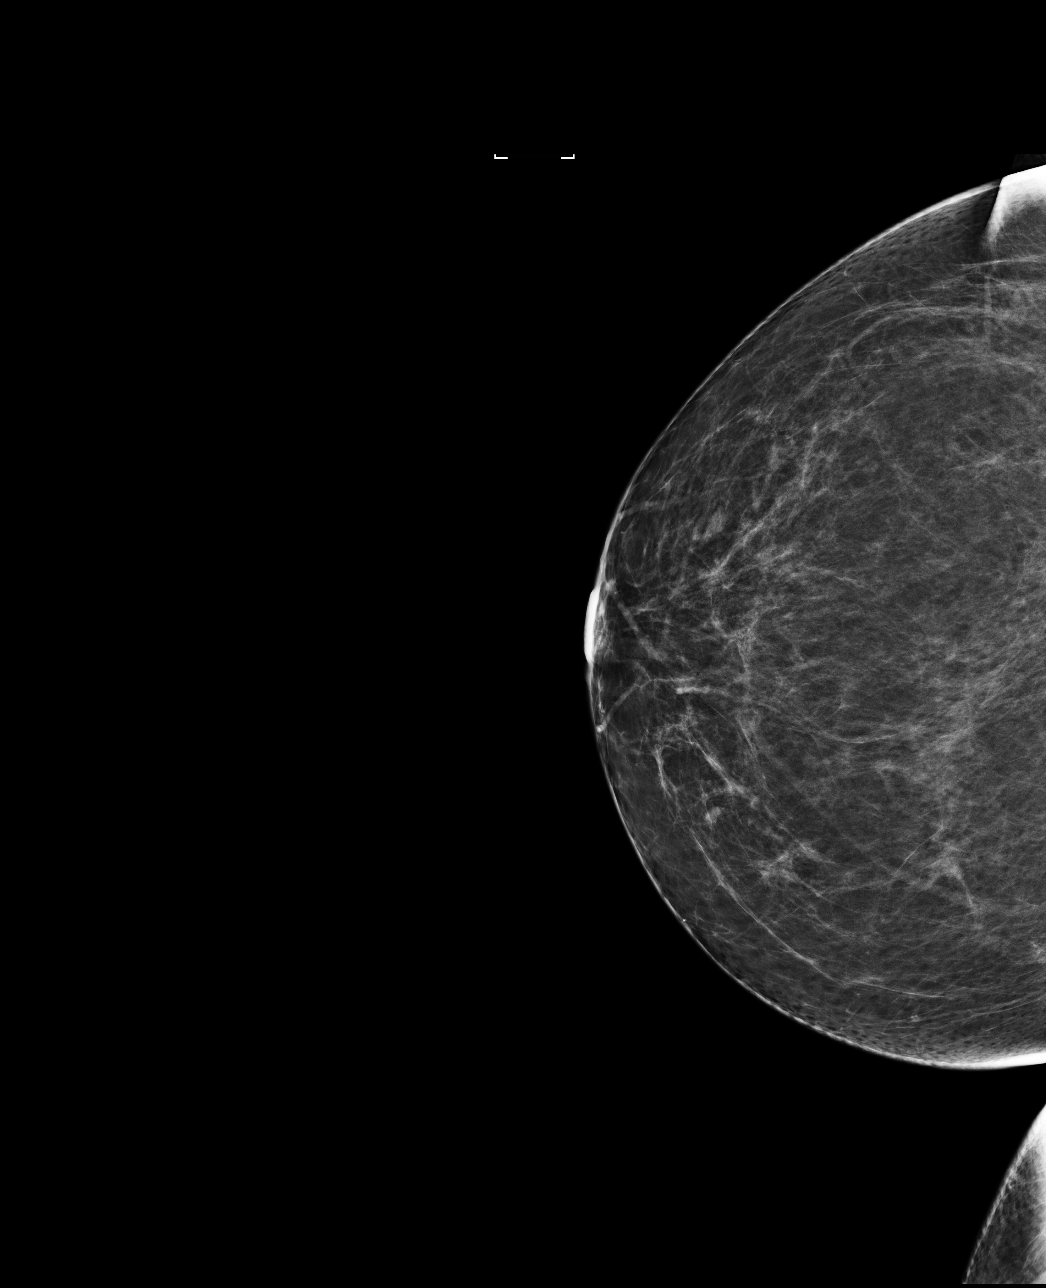

[R MLO]
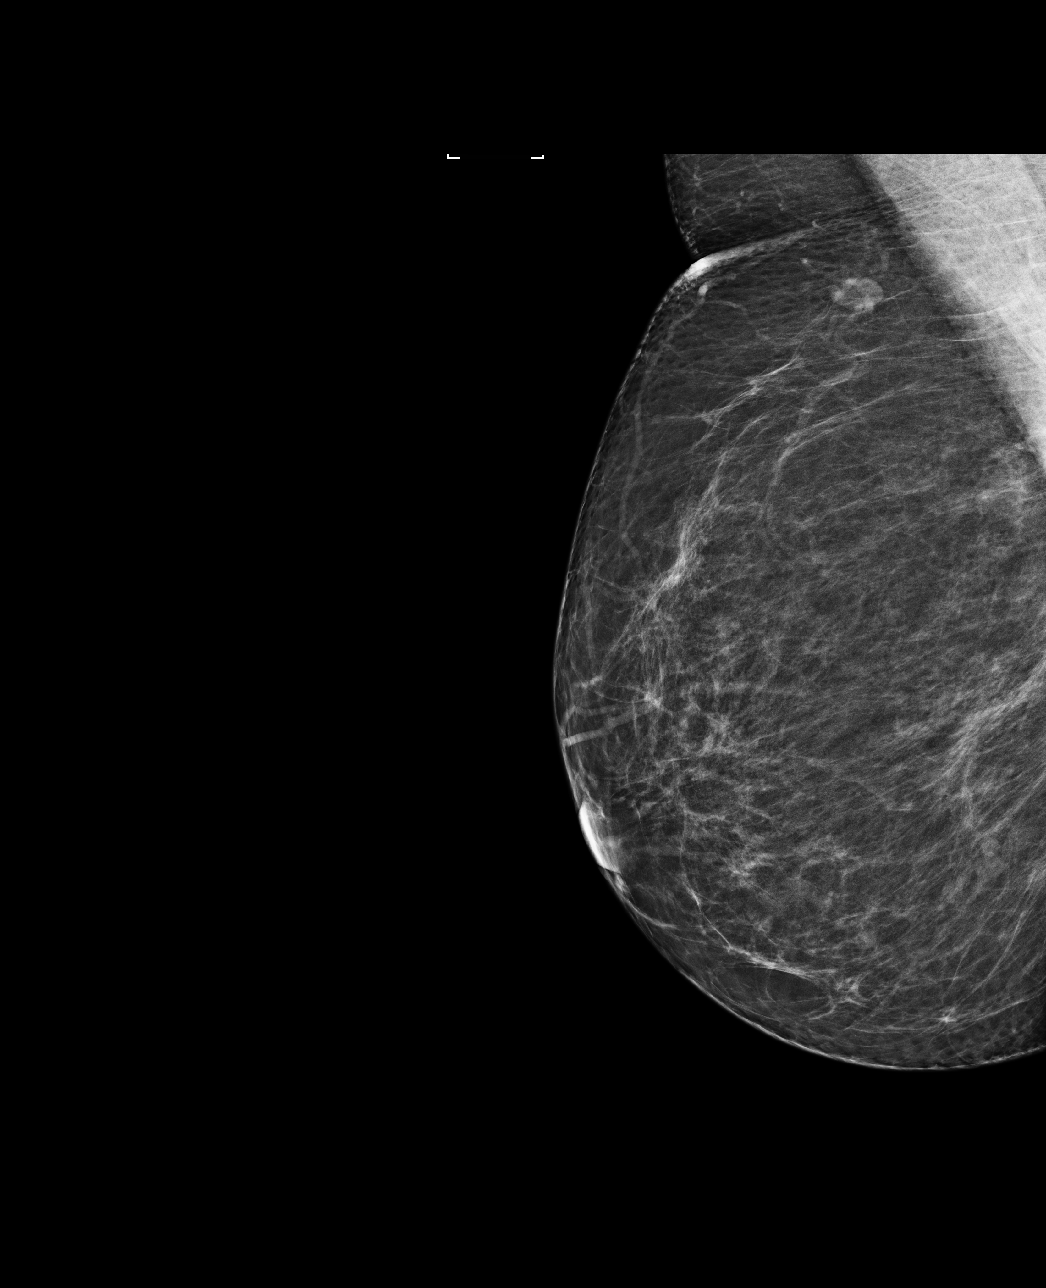

[L CC]
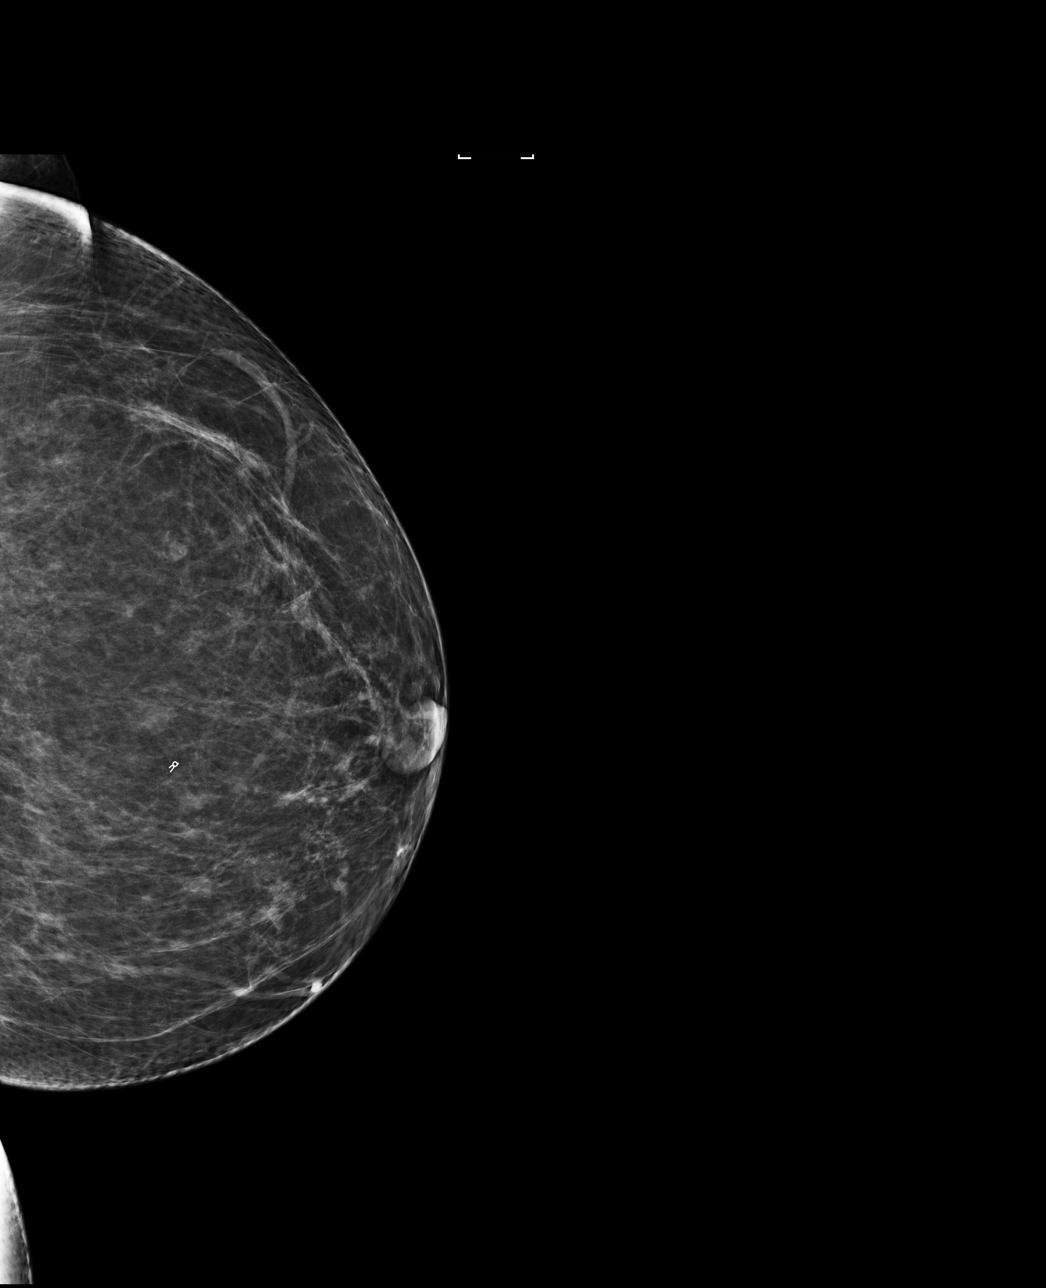

[L MLO]
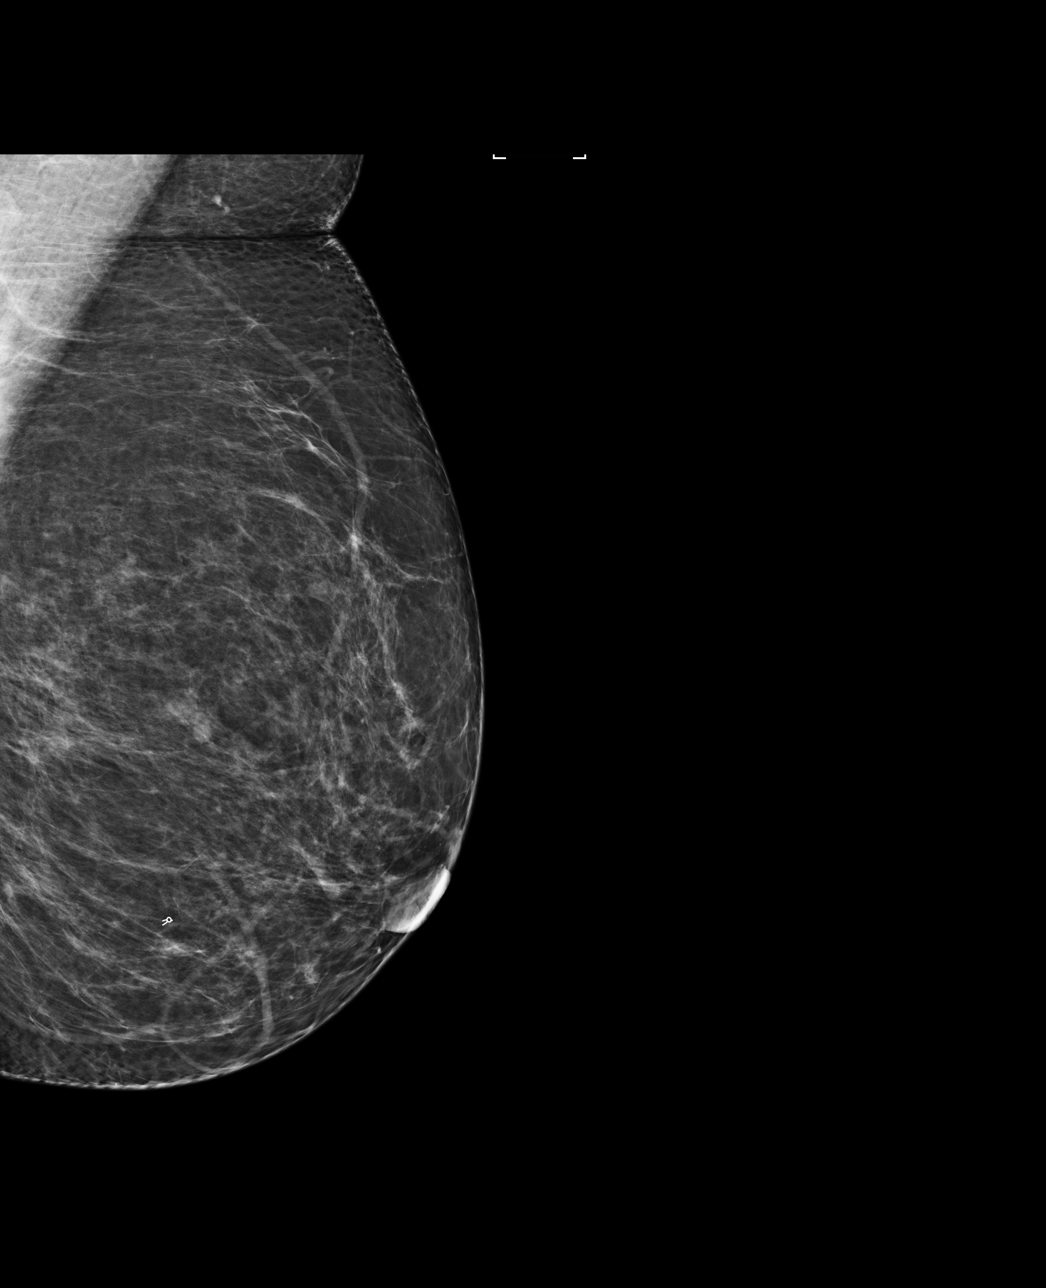

[4 of 4 positions shown; findings below may reference images not displayed]

ACR Breast Density Category b: There are scattered areas of
fibroglandular density.
FINDINGS: There are no findings suspicious for malignancy. Images were
processed with CAD.
IMPRESSION: No mammographic evidence of malignancy. A result letter of this
screening mammogram will be mailed directly to the patient.

RECOMMENDATION:
Screening mammogram in one year. (Code:AS-G-LCT)

BI-RADS CATEGORY  1: Negative.

## 2016-08-10 ENCOUNTER — Encounter: Payer: Self-pay | Admitting: Internal Medicine

## 2016-08-10 ENCOUNTER — Ambulatory Visit (INDEPENDENT_AMBULATORY_CARE_PROVIDER_SITE_OTHER): Payer: BLUE CROSS/BLUE SHIELD | Admitting: Internal Medicine

## 2016-08-10 VITALS — BP 136/82 | HR 83 | Temp 98.6°F | Resp 18 | Ht 66.0 in | Wt 209.0 lb

## 2016-08-10 DIAGNOSIS — F439 Reaction to severe stress, unspecified: Secondary | ICD-10-CM | POA: Diagnosis not present

## 2016-08-10 DIAGNOSIS — E669 Obesity, unspecified: Secondary | ICD-10-CM

## 2016-08-10 DIAGNOSIS — E7800 Pure hypercholesterolemia, unspecified: Secondary | ICD-10-CM | POA: Diagnosis not present

## 2016-08-10 DIAGNOSIS — Z8639 Personal history of other endocrine, nutritional and metabolic disease: Secondary | ICD-10-CM | POA: Diagnosis not present

## 2016-08-10 DIAGNOSIS — M25561 Pain in right knee: Secondary | ICD-10-CM

## 2016-08-10 DIAGNOSIS — R739 Hyperglycemia, unspecified: Secondary | ICD-10-CM | POA: Diagnosis not present

## 2016-08-10 DIAGNOSIS — M25562 Pain in left knee: Secondary | ICD-10-CM | POA: Diagnosis not present

## 2016-08-10 DIAGNOSIS — E039 Hypothyroidism, unspecified: Secondary | ICD-10-CM | POA: Diagnosis not present

## 2016-08-10 DIAGNOSIS — R7989 Other specified abnormal findings of blood chemistry: Secondary | ICD-10-CM

## 2016-08-10 DIAGNOSIS — R945 Abnormal results of liver function studies: Secondary | ICD-10-CM

## 2016-08-10 DIAGNOSIS — E559 Vitamin D deficiency, unspecified: Secondary | ICD-10-CM | POA: Diagnosis not present

## 2016-08-10 NOTE — Progress Notes (Signed)
Pre-visit discussion using our clinic review tool. No additional management support is needed unless otherwise documented below in the visit note.  

## 2016-08-10 NOTE — Progress Notes (Signed)
Patient ID: Carol Jimenez, female   DOB: 1961-02-27, 56 y.o.   MRN: [id number]   Subjective:    Patient ID: Carol Jimenez, female    DOB: Jul 16, 1960, 56 y.o.   MRN: [id number]  HPI  Patient here for a scheduled follow up.  She was having problems with left knee.  Was evaluated.  S/p cortisone injection x 2.  Riding bike.  Helped.  Approximately one month ago, started having pain in her right knee.  Her right knee now bothers her more than her left knee.  If she walks, will aggravate.  Taking meloxicam.  No chest pain.  No sob.  No acid reflux.  No abdominal pain.  Bowels moving.  Discussed effexor.  She is gradually decreasing the dose. The dizziness that occurred when decreasing the dose - has resolved.  Is concerned because she has various joint aches.      Past Medical History:  Diagnosis Date  . Allergy   . Hx: UTI (urinary tract infection)   . Hypothyroidism    Past Surgical History:  Procedure Laterality Date  . BREAST BIOPSY Left 2015   negative, stereotactic biopsy  . CESAREAN SECTION    . ENDOMETRIAL ABLATION  2000   Family History  Problem Relation Age of Onset  . Congestive Heart Failure Mother   . Diabetes Mother   . COPD Mother   . Hypertension Father   . Esophageal cancer Father 14    treated at [facility]  . Colon polyps    . Breast cancer Neg Hx    Social History   Social History  . Marital status: Married    Spouse name: N/A  . Number of children: N/A  . Years of education: N/A   Social History Main Topics  . Smoking status: Never Smoker  . Smokeless tobacco: Never Used  . Alcohol use 0.0 oz/week     Comment: rare  . Drug use: No  . Sexual activity: Not Asked   Other Topics Concern  . None   Social History Narrative  . None    Outpatient Encounter Prescriptions as of 08/10/2016  Medication Sig  . ARMOUR THYROID 90 MG tablet take 1 tablet by mouth once daily  . estradiol (ESTRACE) 0.5 MG tablet take 1-2 tablets by mouth once daily  .  hydrochlorothiazide (HYDRODIURIL) 25 MG tablet take 1 tablet by mouth once daily  . meloxicam (MOBIC) 15 MG tablet Take 15 mg by mouth daily.  . progesterone (PROMETRIUM) 100 MG capsule take 1 capsule by mouth once daily  . venlafaxine XR (EFFEXOR-XR) 37.5 MG 24 hr capsule take 1 capsule by mouth once daily WITH BREAKFAST   No facility-administered encounter medications on file as of 08/10/2016.     Review of Systems  Constitutional: Negative for appetite change and unexpected weight change.  HENT: Negative for congestion and sinus pressure.   Respiratory: Negative for cough, chest tightness and shortness of breath.   Cardiovascular: Negative for chest pain, palpitations and leg swelling.  Gastrointestinal: Negative for abdominal pain, diarrhea, nausea and vomiting.  Genitourinary: Negative for difficulty urinating and dysuria.  Musculoskeletal: Negative for back pain.       Knee pain as outlined.    Skin: Negative for color change and rash.  Neurological: Negative for dizziness, light-headedness and headaches.  Psychiatric/Behavioral: Negative for agitation and dysphoric mood.       Objective:    Physical Exam  Constitutional: She appears well-developed and well-nourished. No distress.  HENT:  Nose: Nose normal.  Mouth/Throat: Oropharynx is clear and moist.  Neck: Neck supple. No thyromegaly present.  Cardiovascular: Normal rate and regular rhythm.   Pulmonary/Chest: Breath sounds normal. No respiratory distress. She has no wheezes.  Abdominal: Soft. Bowel sounds are normal. There is no tenderness.  Musculoskeletal: She exhibits no edema or tenderness.  Lymphadenopathy:    She has no cervical adenopathy.  Skin: No rash noted. No erythema.  Psychiatric: She has a normal mood and affect. Her behavior is normal.    BP 136/82 (BP Location: Left Arm, Patient Position: Sitting, Cuff Size: Large)   Pulse 83   Temp 98.6 F (37 C) (Oral)   Resp 18   Ht '5\' 6"'  (1.676 m)   Wt 209  lb (94.8 kg)   LMP 12/26/2012   SpO2 97%   BMI 33.73 kg/m  Wt Readings from Last 3 Encounters:  08/10/16 209 lb (94.8 kg)  06/10/16 202 lb 9.6 oz (91.9 kg)  04/08/16 208 lb (94.3 kg)     Lab Results  Component Value Date   WBC 6.1 05/05/2015   HGB 15.0 05/05/2015   HCT 44.0 05/05/2015   PLT 211.0 05/05/2015   GLUCOSE 113 (H) 04/05/2016   CHOL 182 04/05/2016   TRIG 187.0 (H) 04/05/2016   HDL 47.70 04/05/2016   LDLDIRECT 146.0 10/29/2015   LDLCALC 97 04/05/2016   ALT 33 04/05/2016   AST 35 04/05/2016   NA 139 04/05/2016   K 3.9 04/05/2016   CL 102 04/05/2016   CREATININE 0.60 04/05/2016   BUN 13 04/05/2016   CO2 29 04/05/2016   TSH 2.09 04/05/2016   HGBA1C 5.6 10/30/2014    Mm Digital Screening  Result Date: 05/05/2016 CLINICAL DATA:  Screening. EXAM: DIGITAL SCREENING BILATERAL MAMMOGRAM WITH CAD COMPARISON:  Previous exam(s). ACR Breast Density Category b: There are scattered areas of fibroglandular density. FINDINGS: There are no findings suspicious for malignancy. Images were processed with CAD. IMPRESSION: No mammographic evidence of malignancy. A result letter of this screening mammogram will be mailed directly to the patient. RECOMMENDATION: Screening mammogram in one year. (Code:SM-B-01Y) BI-RADS CATEGORY  1: Negative. Electronically Signed   By: Nolon Nations M.D.   On: 05/05/2016 09:50       Assessment & Plan:   Problem List Items Addressed This Visit    Abnormal liver function tests    Follow liver panel.        Relevant Orders   Hepatic function panel   History of non anemic vitamin B12 deficiency    Follow b12 level.        Relevant Orders   Vitamin B12   Hypercholesterolemia    Low cholesterol diet and exercise.  Follow lipid panel.        Relevant Orders   CBC with Differential/Platelet   Lipid panel   Hyperglycemia    Low carb diet and exercise.  Follow met b and a1c.        Relevant Orders   Hemoglobin M5H   Basic metabolic panel    Hypothyroid    On thyroid replacement.  Follow tsh.        Obesity (BMI 30.0-34.9)    Diet and exercise.  Follow.       Right knee pain    Now having more right knee pain than left knee pain.  On meloxicam.  Persistent.  Refer to ortho for evaluation.        Relevant Orders   Sedimentation rate   Rheumatoid  factor   ANA   C-reactive protein   Stress    On effexor.  Trying to taper.  Continue slow taper.  Follow.        Vitamin D deficiency    Continue vitamin D supplements.  Follow vitamin D level.       Relevant Orders   VITAMIN D 25 Hydroxy (Vit-D Deficiency, Fractures)    Other Visit Diagnoses    Pain in both knees, unspecified chronicity    -  Primary   Relevant Orders   Ambulatory referral to Orthopedic Surgery       Einar Pheasant, MD

## 2016-08-10 NOTE — Progress Notes (Signed)
Patient ID: Carol Jimenez, female   DOB: 12/27/1960, 56 y.o.   MRN: [id number]

## 2016-08-22 ENCOUNTER — Encounter: Payer: Self-pay | Admitting: Internal Medicine

## 2016-08-22 NOTE — Assessment & Plan Note (Signed)
Continue vitamin D supplements.  Follow vitamin D level.   

## 2016-08-22 NOTE — Assessment & Plan Note (Addendum)
Now having more right knee pain than left knee pain.  On meloxicam.  Persistent.  Refer to ortho for evaluation.

## 2016-08-22 NOTE — Assessment & Plan Note (Signed)
Follow b12 level.  ?

## 2016-08-22 NOTE — Assessment & Plan Note (Signed)
Low cholesterol diet and exercise.  Follow lipid panel.   

## 2016-08-22 NOTE — Assessment & Plan Note (Signed)
Follow liver panel.  

## 2016-08-22 NOTE — Assessment & Plan Note (Signed)
Diet and exercise.  Follow.  

## 2016-08-22 NOTE — Assessment & Plan Note (Signed)
On effexor.  Trying to taper.  Continue slow taper.  Follow.

## 2016-08-22 NOTE — Assessment & Plan Note (Signed)
On thyroid replacement.  Follow tsh.  

## 2016-08-22 NOTE — Assessment & Plan Note (Signed)
Low carb diet and exercise.  Follow met b and a1c.   

## 2016-09-03 ENCOUNTER — Other Ambulatory Visit (INDEPENDENT_AMBULATORY_CARE_PROVIDER_SITE_OTHER): Payer: BLUE CROSS/BLUE SHIELD

## 2016-09-03 DIAGNOSIS — Z8639 Personal history of other endocrine, nutritional and metabolic disease: Secondary | ICD-10-CM

## 2016-09-03 DIAGNOSIS — E7800 Pure hypercholesterolemia, unspecified: Secondary | ICD-10-CM

## 2016-09-03 DIAGNOSIS — M25561 Pain in right knee: Secondary | ICD-10-CM | POA: Diagnosis not present

## 2016-09-03 DIAGNOSIS — R7989 Other specified abnormal findings of blood chemistry: Secondary | ICD-10-CM

## 2016-09-03 DIAGNOSIS — E559 Vitamin D deficiency, unspecified: Secondary | ICD-10-CM

## 2016-09-03 DIAGNOSIS — R739 Hyperglycemia, unspecified: Secondary | ICD-10-CM

## 2016-09-03 DIAGNOSIS — R945 Abnormal results of liver function studies: Secondary | ICD-10-CM

## 2016-09-03 LAB — CBC WITH DIFFERENTIAL/PLATELET
BASOS PCT: 1.1 % (ref 0.0–3.0)
Basophils Absolute: 0.1 10*3/uL (ref 0.0–0.1)
EOS PCT: 2.1 % (ref 0.0–5.0)
Eosinophils Absolute: 0.1 10*3/uL (ref 0.0–0.7)
HCT: 44.8 % (ref 36.0–46.0)
Hemoglobin: 15.3 g/dL — ABNORMAL HIGH (ref 12.0–15.0)
LYMPHS ABS: 1.6 10*3/uL (ref 0.7–4.0)
Lymphocytes Relative: 27.5 % (ref 12.0–46.0)
MCHC: 34.2 g/dL (ref 30.0–36.0)
MCV: 93.3 fl (ref 78.0–100.0)
MONOS PCT: 7.3 % (ref 3.0–12.0)
Monocytes Absolute: 0.4 10*3/uL (ref 0.1–1.0)
NEUTROS PCT: 62 % (ref 43.0–77.0)
Neutro Abs: 3.7 10*3/uL (ref 1.4–7.7)
Platelets: 224 10*3/uL (ref 150.0–400.0)
RBC: 4.8 Mil/uL (ref 3.87–5.11)
RDW: 13.1 % (ref 11.5–15.5)
WBC: 6 10*3/uL (ref 4.0–10.5)

## 2016-09-03 LAB — LIPID PANEL
CHOLESTEROL: 199 mg/dL (ref 0–200)
HDL: 50.1 mg/dL (ref >39.00)
LDL Cholesterol: 114 mg/dL — ABNORMAL HIGH (ref 0–99)
NonHDL: 148.58
TRIGLYCERIDES: 174 mg/dL — AB (ref 0.0–149.0)
Total CHOL/HDL Ratio: 4
VLDL: 34.8 mg/dL (ref 0.0–40.0)

## 2016-09-03 LAB — SEDIMENTATION RATE: Sed Rate: 9 mm/hr (ref 0–30)

## 2016-09-03 LAB — BASIC METABOLIC PANEL
BUN: 15 mg/dL (ref 6–23)
CO2: 32 meq/L (ref 19–32)
CREATININE: 0.72 mg/dL (ref 0.40–1.20)
Calcium: 10 mg/dL (ref 8.4–10.5)
Chloride: 101 mEq/L (ref 96–112)
GFR: 88.9 mL/min (ref >60.00)
GLUCOSE: 119 mg/dL — AB (ref 70–99)
POTASSIUM: 3.7 meq/L (ref 3.5–5.1)
Sodium: 141 mEq/L (ref 135–145)

## 2016-09-03 LAB — HEPATIC FUNCTION PANEL
ALBUMIN: 4.6 g/dL (ref 3.5–5.2)
ALT: 43 U/L — AB (ref 0–35)
AST: 41 U/L — AB (ref 0–37)
Alkaline Phosphatase: 62 U/L (ref 39–117)
Bilirubin, Direct: 0.1 mg/dL (ref 0.0–0.3)
Total Bilirubin: 0.7 mg/dL (ref 0.2–1.2)
Total Protein: 7.4 g/dL (ref 6.0–8.3)

## 2016-09-03 LAB — VITAMIN D 25 HYDROXY (VIT D DEFICIENCY, FRACTURES): VITD: 26.91 ng/mL — AB (ref 30.00–100.00)

## 2016-09-03 LAB — C-REACTIVE PROTEIN: CRP: 0.5 mg/dL (ref 0.5–20.0)

## 2016-09-03 LAB — VITAMIN B12: VITAMIN B 12: 522 pg/mL (ref 211–911)

## 2016-09-03 LAB — HEMOGLOBIN A1C: HEMOGLOBIN A1C: 5.8 % (ref 4.6–6.5)

## 2016-09-06 ENCOUNTER — Telehealth: Payer: Self-pay

## 2016-09-06 LAB — RHEUMATOID FACTOR: Rhuematoid fact SerPl-aCnc: <14 IU/mL (ref <14)

## 2016-09-06 LAB — ANA: Anti Nuclear Antibody(ANA): NEGATIVE

## 2016-09-06 NOTE — Telephone Encounter (Signed)
-----   Message from Dale Durhamharlene Scott, MD sent at 09/05/2016  6:03 PM EDT ----- Please call and notify pt that her vitamin D level is slightly decreased.  Need to confirm how much vitamin D she is taking.  If not taking any vitamin D supplements, the start vitamin D3 1000 units per day.  Cholesterol increased some when compared to previous check.  Overall sugar control increased slightly.  Liver function tests slightly elevated.  Continue diet and exercise.  We will follow.  Recheck liver panel in 2-3 weeks.  Inflammation tests to date are wnl.  One test still pending).  B12 and kidney function tests are wnl.

## 2016-09-06 NOTE — Telephone Encounter (Signed)
Left message to return call to our office.  

## 2016-09-07 ENCOUNTER — Telehealth: Payer: BLUE CROSS/BLUE SHIELD | Admitting: Family

## 2016-09-07 DIAGNOSIS — R05 Cough: Secondary | ICD-10-CM

## 2016-09-07 DIAGNOSIS — J208 Acute bronchitis due to other specified organisms: Secondary | ICD-10-CM

## 2016-09-07 DIAGNOSIS — R059 Cough, unspecified: Secondary | ICD-10-CM

## 2016-09-07 MED ORDER — PREDNISONE 10 MG (21) PO TBPK: ORAL_TABLET | ORAL | 0 refills | Status: DC

## 2016-09-07 NOTE — Telephone Encounter (Signed)
Pt also needs to be informed of the following.  You have another message on this pt and left message for pt to call. Her other two rheumatological tests are negative. See other lab result note.

## 2016-09-07 NOTE — Telephone Encounter (Signed)
Spoke to patient gave all results lab app made will call if any questions.

## 2016-09-07 NOTE — Progress Notes (Signed)
We are sorry that you are not feeling well.  Here is how we plan to help!  Based on what you have shared with me it looks like you have upper respiratory tract inflammation that has resulted in a significant cough.  Inflammation and infection in the upper respiratory tract is commonly called bronchitis and has four common causes:  Allergies, Viral Infections, Acid Reflux and Bacterial Infections.  Allergies, viruses and acid reflux are treated by controlling symptoms or eliminating the cause. An example might be a cough caused by taking certain blood pressure medications. You stop the cough by changing the medication. Another example might be a cough caused by acid reflux. Controlling the reflux helps control the cough.  Based on your presentation I believe you most likely have A cough due to reflux. To lessen this cough you can use the over-the counter cough medication Delsym but it is very important that we control the cause of the cough.  I suggest that you begin Prilosec 20 mg twice a day for 2 weeks.  You should see the cough lessen quickly as your reflux ( which may be silent or without burning ) is treated.      Sterapred 5 mg dosepak  USE OF BRONCHODILATOR ("RESCUE") INHALERS: There is a risk from using your bronchodilator too frequently.  The risk is that over-reliance on a medication which only relaxes the muscles surrounding the breathing tubes can reduce the effectiveness of medications prescribed to reduce swelling and congestion of the tubes themselves.  Although you feel brief relief from the bronchodilator inhaler, your asthma may actually be worsening with the tubes becoming more swollen and filled with mucus.  This can delay other crucial treatments, such as oral steroid medications. If you need to use a bronchodilator inhaler daily, several times per day, you should discuss this with your provider.  There are probably better treatments that could be used to keep your asthma under  control.     HOME CARE . Only take medications as instructed by your medical team. . Complete the entire course of an antibiotic. . Drink plenty of fluids and get plenty of rest. . Avoid close contacts especially the very young and the elderly . Cover your mouth if you cough or cough into your sleeve. . Always remember to wash your hands . A steam or ultrasonic humidifier can help congestion.   GET HELP RIGHT AWAY IF: . You develop worsening fever. . You become short of breath . You cough up blood. . Your symptoms persist after you have completed your treatment plan MAKE SURE YOU   Understand these instructions.  Will watch your condition.  Will get help right away if you are not doing well or get worse.  Your e-visit answers were reviewed by a board certified advanced clinical practitioner to complete your personal care plan.  Depending on the condition, your plan could have included both over the counter or prescription medications. If there is a problem please reply  once you have received a response from your provider. Your safety is important to us.  If you have drug allergies check your prescription carefully.    You can use MyChart to ask questions about today's visit, request a non-urgent call back, or ask for a work or school excuse for 24 hours related to this e-Visit. If it has been greater than 24 hours you will need to follow up with your provider, or enter a new e-Visit to address those concerns. You will  get an e-mail in the next two days asking about your experience.  I hope that your e-visit has been valuable and will speed your recovery. Thank you for using e-visits.

## 2016-09-13 ENCOUNTER — Other Ambulatory Visit: Payer: Self-pay | Admitting: Internal Medicine

## 2016-09-14 ENCOUNTER — Other Ambulatory Visit: Payer: Self-pay | Admitting: Internal Medicine

## 2016-09-14 ENCOUNTER — Telehealth: Payer: BLUE CROSS/BLUE SHIELD | Admitting: Nurse Practitioner

## 2016-09-14 DIAGNOSIS — J209 Acute bronchitis, unspecified: Secondary | ICD-10-CM

## 2016-09-14 DIAGNOSIS — J069 Acute upper respiratory infection, unspecified: Secondary | ICD-10-CM

## 2016-09-14 MED ORDER — AZITHROMYCIN 250 MG PO TABS: ORAL_TABLET | ORAL | 0 refills | Status: DC

## 2016-09-14 MED ORDER — PREDNISONE 10 MG (21) PO TBPK
ORAL_TABLET | ORAL | 0 refills | Status: AC
Start: 2016-09-14 — End: 2016-09-20

## 2016-09-14 MED ORDER — BENZONATATE 100 MG PO CAPS: 100.0000 mg | ORAL_CAPSULE | Freq: Three times a day (TID) | ORAL | 0 refills | Status: AC | PRN

## 2016-09-14 NOTE — Progress Notes (Signed)
We are sorry that you are not feeling well.  Here is how we plan to help!  Based on what you have shared with me it looks like you have upper respiratory tract inflammation that has resulted in a significant cough.  Inflammation and infection in the upper respiratory tract is commonly called bronchitis and has four common causes:  Allergies, Viral Infections, Acid Reflux and Bacterial Infections.  Allergies, viruses and acid reflux are treated by controlling symptoms or eliminating the cause. An example might be a cough caused by taking certain blood pressure medications. You stop the cough by changing the medication. Another example might be a cough caused by acid reflux. Controlling the reflux helps control the cough.  Based on your presentation I believe you most likely have A cough due to a virus.  This is called viral bronchitis and is best treated by rest, plenty of fluids and control of the cough.  You may use Ibuprofen or Tylenol as directed to help your symptoms.   I am also prescribing Azithromycin to take as directed.      In addition you may use A prescription cough medication called Tessalon Perles 100mg . You may take 1-2 capsules every 8 hours as needed for your cough.  Sterapred 10 mg dosepak  USE OF BRONCHODILATOR ("RESCUE") INHALERS: There is a risk from using your bronchodilator too frequently.  The risk is that over-reliance on a medication which only relaxes the muscles surrounding the breathing tubes can reduce the effectiveness of medications prescribed to reduce swelling and congestion of the tubes themselves.  Although you feel brief relief from the bronchodilator inhaler, your asthma may actually be worsening with the tubes becoming more swollen and filled with mucus.  This can delay other crucial treatments, such as oral steroid medications. If you need to use a bronchodilator inhaler daily, several times per day, you should discuss this with your provider.  There are probably  better treatments that could be used to keep your asthma under control.     HOME CARE . Only take medications as instructed by your medical team. . Complete the entire course of an antibiotic. . Drink plenty of fluids and get plenty of rest. . Avoid close contacts especially the very young and the elderly . Cover your mouth if you cough or cough into your sleeve. . Always remember to wash your hands . A steam or ultrasonic humidifier can help congestion.   GET HELP RIGHT AWAY IF: . You develop worsening fever. . You become short of breath . You cough up blood. . Your symptoms persist after you have completed your treatment plan MAKE SURE YOU   Understand these instructions.  Will watch your condition.  Will get help right away if you are not doing well or get worse.  Your e-visit answers were reviewed by a board certified advanced clinical practitioner to complete your personal care plan.  Depending on the condition, your plan could have included both over the counter or prescription medications. If there is a problem please reply  once you have received a response from your provider. Your safety is important to us.  If you have drug allergies check your prescription carefully.    You can use MyChart to ask questions about today's visit, request a non-urgent call back, or ask for a work or school excuse for 24 hours related to this e-Visit. If it has been greater than 24 hours you will need to follow up with your provider, or enter  a new e-Visit to address those concerns. You will get an e-mail in the next two days asking about your experience.  I hope that your e-visit has been valuable and will speed your recovery. Thank you for using e-visits.

## 2016-09-15 ENCOUNTER — Other Ambulatory Visit: Payer: Self-pay | Admitting: Internal Medicine

## 2016-09-15 ENCOUNTER — Encounter: Payer: Self-pay | Admitting: Internal Medicine

## 2016-09-15 MED ORDER — HYDROCHLOROTHIAZIDE 25 MG PO TABS: 25.0000 mg | ORAL_TABLET | Freq: Every day | ORAL | 3 refills | Status: DC

## 2016-09-15 MED ORDER — THYROID 90 MG PO TABS: 90.0000 mg | ORAL_TABLET | Freq: Every day | ORAL | 0 refills | Status: DC

## 2016-09-15 MED ORDER — VENLAFAXINE HCL ER 37.5 MG PO CP24: ORAL_CAPSULE | ORAL | 0 refills | Status: DC

## 2016-09-15 MED ORDER — THYROID 90 MG PO TABS: 90.0000 mg | ORAL_TABLET | Freq: Every day | ORAL | 3 refills | Status: DC

## 2016-09-15 NOTE — Telephone Encounter (Signed)
rx sent in for effexor #90 with no refills per pt request.

## 2016-09-15 NOTE — Telephone Encounter (Signed)
Refill sent as requested. 

## 2016-09-15 NOTE — Telephone Encounter (Signed)
Ok to send 90 day supply instead of 30? Medication was approved on 09/13/16 for 30?

## 2016-09-16 NOTE — Telephone Encounter (Signed)
Do not see where we have refilled.  If has been receiving this from another md - need to f/u with refill from that MD.  How often taking?

## 2016-09-16 NOTE — Telephone Encounter (Signed)
Don't think this is a script you have given in the past? Please advise If ok to refill.

## 2016-10-04 ENCOUNTER — Telehealth: Payer: Self-pay | Admitting: Radiology

## 2016-10-04 NOTE — Telephone Encounter (Signed)
Pt coming in for labs tomorrow, please place future orders. Thank  You.  

## 2016-10-05 ENCOUNTER — Other Ambulatory Visit: Payer: Self-pay | Admitting: Internal Medicine

## 2016-10-05 ENCOUNTER — Other Ambulatory Visit (INDEPENDENT_AMBULATORY_CARE_PROVIDER_SITE_OTHER): Payer: BLUE CROSS/BLUE SHIELD

## 2016-10-05 DIAGNOSIS — R945 Abnormal results of liver function studies: Secondary | ICD-10-CM

## 2016-10-05 DIAGNOSIS — R7989 Other specified abnormal findings of blood chemistry: Secondary | ICD-10-CM | POA: Diagnosis not present

## 2016-10-05 LAB — HEPATIC FUNCTION PANEL
ALBUMIN: 4.2 g/dL (ref 3.5–5.2)
ALK PHOS: 53 U/L (ref 39–117)
ALT: 40 U/L — ABNORMAL HIGH (ref 0–35)
AST: 41 U/L — AB (ref 0–37)
Bilirubin, Direct: 0.1 mg/dL (ref 0.0–0.3)
TOTAL PROTEIN: 7.3 g/dL (ref 6.0–8.3)
Total Bilirubin: 0.6 mg/dL (ref 0.2–1.2)

## 2016-10-05 NOTE — Progress Notes (Signed)
Order placed for f/u liver panel.  

## 2016-10-05 NOTE — Telephone Encounter (Signed)
Order placed for f/u liver panel.  

## 2016-10-06 LAB — HM COLONOSCOPY

## 2016-10-07 ENCOUNTER — Encounter: Payer: Self-pay | Admitting: Internal Medicine

## 2016-11-09 ENCOUNTER — Ambulatory Visit (INDEPENDENT_AMBULATORY_CARE_PROVIDER_SITE_OTHER): Payer: BLUE CROSS/BLUE SHIELD | Admitting: Internal Medicine

## 2016-11-09 ENCOUNTER — Encounter: Payer: Self-pay | Admitting: Internal Medicine

## 2016-11-09 DIAGNOSIS — E669 Obesity, unspecified: Secondary | ICD-10-CM

## 2016-11-09 DIAGNOSIS — R945 Abnormal results of liver function studies: Secondary | ICD-10-CM

## 2016-11-09 DIAGNOSIS — F439 Reaction to severe stress, unspecified: Secondary | ICD-10-CM

## 2016-11-09 DIAGNOSIS — E7800 Pure hypercholesterolemia, unspecified: Secondary | ICD-10-CM

## 2016-11-09 DIAGNOSIS — R739 Hyperglycemia, unspecified: Secondary | ICD-10-CM | POA: Diagnosis not present

## 2016-11-09 DIAGNOSIS — R7989 Other specified abnormal findings of blood chemistry: Secondary | ICD-10-CM

## 2016-11-09 DIAGNOSIS — E039 Hypothyroidism, unspecified: Secondary | ICD-10-CM | POA: Diagnosis not present

## 2016-11-09 NOTE — Progress Notes (Signed)
Pre-visit discussion using our clinic review tool. No additional management support is needed unless otherwise documented below in the visit note.  

## 2016-11-09 NOTE — Progress Notes (Signed)
Patient ID: NORMAN BIER, female   DOB: 03-10-1961, 56 y.o.   MRN: [id number]   Subjective:    Patient ID: [name], female    DOB: Nov 22, 1960, 56 y.o.   MRN: [id number]  HPI  Patient here for a scheduled follow up.   She reports she is doing relatively well.  Has been having problems with her left knee.  S/p injection.  She is limited in squatting and stairs aggravate.  Tries to stay active.  On effexor.  Has leveled things out.  Trying to taper off.  Is having to go slow with the taper.  Made her feel dizzy.  Is doing better now.  No headache.  No chest pain.  No sob.  No acid reflux.  No abdominal pain.  Bowels moving.     Past Medical History:  Diagnosis Date  . Allergy   . Hx: UTI (urinary tract infection)   . Hypothyroidism    Past Surgical History:  Procedure Laterality Date  . BREAST BIOPSY Left 2015   negative, stereotactic biopsy  . CESAREAN SECTION    . ENDOMETRIAL ABLATION  2000   Family History  Problem Relation Age of Onset  . Congestive Heart Failure Mother   . Diabetes Mother   . COPD Mother   . Hypertension Father   . Esophageal cancer Father 26       [facility]  . Colon polyps Unknown   . Breast cancer Neg Hx    Social History   Social History  . Marital status: Married    Spouse name: N/A  . Number of children: N/A  . Years of education: N/A   Social History Main Topics  . Smoking status: Never Smoker  . Smokeless tobacco: Never Used  . Alcohol use 0.0 oz/week     Comment: rare  . Drug use: No  . Sexual activity: Not Asked   Other Topics Concern  . None   Social History Narrative  . None    Outpatient Encounter Prescriptions as of 11/09/2016  Medication Sig  . estradiol (ESTRACE) 0.5 MG tablet take 1-2 tablets by mouth once daily  . hydrochlorothiazide (HYDRODIURIL) 25 MG tablet Take 1 tablet (25 mg total) by mouth daily.  . meloxicam (MOBIC) 15 MG tablet Take 15 mg by mouth daily.  . progesterone (PROMETRIUM) 100 MG  capsule take 1 capsule by mouth once daily  . thyroid (ARMOUR THYROID) 90 MG tablet Take 1 tablet (90 mg total) by mouth daily.  Marland Kitchen thyroid (ARMOUR THYROID) 90 MG tablet Take 1 tablet (90 mg total) by mouth daily.  Marland Kitchen venlafaxine XR (EFFEXOR-XR) 37.5 MG 24 hr capsule take 1 capsule by mouth once daily with BREAKFAST  . [DISCONTINUED] azithromycin (ZITHROMAX) 250 MG tablet Take as directed.  . [DISCONTINUED] predniSONE (STERAPRED UNI-PAK 21 TAB) 10 MG (21) TBPK tablet dosepak as directed   No facility-administered encounter medications on file as of 11/09/2016.     Review of Systems  Constitutional: Negative for appetite change and unexpected weight change.  HENT: Negative for congestion and sinus pressure.   Respiratory: Negative for cough, chest tightness and shortness of breath.   Cardiovascular: Negative for chest pain, palpitations and leg swelling.  Gastrointestinal: Negative for abdominal pain and diarrhea.  Genitourinary: Negative for difficulty urinating and dysuria.  Musculoskeletal: Negative for myalgias.       Left knee pain as outlined.    Skin: Negative for color change and rash.  Neurological: Negative for dizziness, light-headedness  and headaches.  Psychiatric/Behavioral: Negative for agitation and dysphoric mood.       Objective:     Blood pressure rechecked by me:  130/84  Physical Exam  Constitutional: She appears well-developed and well-nourished. No distress.  HENT:  Nose: Nose normal.  Mouth/Throat: Oropharynx is clear and moist.  Neck: Neck supple. No thyromegaly present.  Cardiovascular: Normal rate and regular rhythm.   Pulmonary/Chest: Breath sounds normal. No respiratory distress. She has no wheezes.  Abdominal: Soft. Bowel sounds are normal. There is no tenderness.  Musculoskeletal: She exhibits no edema or tenderness.  Lymphadenopathy:    She has no cervical adenopathy.  Skin: No rash noted. No erythema.  Psychiatric: She has a normal mood and  affect. Her behavior is normal.    BP 136/80 (BP Location: Left Arm, Patient Position: Sitting, Cuff Size: Large)   Pulse 75   Temp 98.6 F (37 C) (Oral)   Ht '5\' 6"'  (1.676 m)   Wt 207 lb (93.9 kg)   LMP 12/26/2012   BMI 33.41 kg/m  Wt Readings from Last 3 Encounters:  11/09/16 207 lb (93.9 kg)  08/10/16 209 lb (94.8 kg)  06/10/16 202 lb 9.6 oz (91.9 kg)     Lab Results  Component Value Date   WBC 6.0 09/03/2016   HGB 15.3 (H) 09/03/2016   HCT 44.8 09/03/2016   PLT 224.0 09/03/2016   GLUCOSE 119 (H) 09/03/2016   CHOL 199 09/03/2016   TRIG 174.0 (H) 09/03/2016   HDL 50.10 09/03/2016   LDLDIRECT 146.0 10/29/2015   LDLCALC 114 (H) 09/03/2016   ALT 40 (H) 10/05/2016   AST 41 (H) 10/05/2016   NA 141 09/03/2016   K 3.7 09/03/2016   CL 101 09/03/2016   CREATININE 0.72 09/03/2016   BUN 15 09/03/2016   CO2 32 09/03/2016   TSH 2.09 04/05/2016   HGBA1C 5.8 09/03/2016    Mm Digital Screening  Result Date: 05/05/2016 CLINICAL DATA:  Screening. EXAM: DIGITAL SCREENING BILATERAL MAMMOGRAM WITH CAD COMPARISON:  Previous exam(s). ACR Breast Density Category b: There are scattered areas of fibroglandular density. FINDINGS: There are no findings suspicious for malignancy. Images were processed with CAD. IMPRESSION: No mammographic evidence of malignancy. A result letter of this screening mammogram will be mailed directly to the patient. RECOMMENDATION: Screening mammogram in one year. (Code:SM-B-01Y) BI-RADS CATEGORY  1: Negative. Electronically Signed   By: Nolon Nations M.D.   On: 05/05/2016 09:50       Assessment & Plan:   Problem List Items Addressed This Visit    Abnormal liver function tests    Previous ultrasound revealed fatty liver.  Diet and exercise.  Follow liver function tests.        Hypercholesterolemia    Low cholesterol diet and exercise.  Follow lipid panel.        Hyperglycemia    Low carb diet and exercise.  Follow met b and a1c.        Hypothyroid      On thyroid replacement.  Follow tsh.        Obesity (BMI 30.0-34.9)    Diet and exercise.  Follow.        Stress    On effexor.  Tapering off slowly.  Will follow.            Einar Pheasant, MD

## 2016-11-17 ENCOUNTER — Ambulatory Visit: Payer: BLUE CROSS/BLUE SHIELD | Admitting: Internal Medicine

## 2016-11-18 ENCOUNTER — Encounter: Payer: Self-pay | Admitting: Internal Medicine

## 2016-11-18 NOTE — Telephone Encounter (Signed)
I do not mind seeing her, but my establish care appts are scheduled out.  If she needs a referral now, let her know of providers taking pt at our office.  Thanks.

## 2016-11-22 ENCOUNTER — Encounter: Payer: Self-pay | Admitting: Internal Medicine

## 2016-11-22 NOTE — Assessment & Plan Note (Signed)
Diet and exercise.  Follow.  

## 2016-11-22 NOTE — Assessment & Plan Note (Signed)
Low carb diet and exercise.  Follow met b and a1c.   

## 2016-11-22 NOTE — Assessment & Plan Note (Signed)
Previous ultrasound revealed fatty liver.  Diet and exercise. Follow liver function tests.  

## 2016-11-22 NOTE — Assessment & Plan Note (Signed)
On thyroid replacement.  Follow tsh.  

## 2016-11-22 NOTE — Assessment & Plan Note (Signed)
On effexor.  Tapering off slowly.  Will follow.

## 2016-11-22 NOTE — Assessment & Plan Note (Signed)
Low cholesterol diet and exercise.  Follow lipid panel.   

## 2016-12-09 ENCOUNTER — Other Ambulatory Visit: Payer: Self-pay | Admitting: Internal Medicine

## 2017-02-07 ENCOUNTER — Encounter: Payer: Self-pay | Admitting: Internal Medicine

## 2017-02-11 ENCOUNTER — Encounter: Payer: Self-pay | Admitting: Internal Medicine

## 2017-02-11 ENCOUNTER — Ambulatory Visit (INDEPENDENT_AMBULATORY_CARE_PROVIDER_SITE_OTHER): Payer: BLUE CROSS/BLUE SHIELD | Admitting: Internal Medicine

## 2017-02-11 DIAGNOSIS — R739 Hyperglycemia, unspecified: Secondary | ICD-10-CM | POA: Diagnosis not present

## 2017-02-11 DIAGNOSIS — N62 Hypertrophy of breast: Secondary | ICD-10-CM

## 2017-02-11 DIAGNOSIS — R945 Abnormal results of liver function studies: Secondary | ICD-10-CM | POA: Diagnosis not present

## 2017-02-11 DIAGNOSIS — E7800 Pure hypercholesterolemia, unspecified: Secondary | ICD-10-CM | POA: Diagnosis not present

## 2017-02-11 DIAGNOSIS — N6489 Other specified disorders of breast: Secondary | ICD-10-CM

## 2017-02-11 DIAGNOSIS — E039 Hypothyroidism, unspecified: Secondary | ICD-10-CM

## 2017-02-11 DIAGNOSIS — Z8639 Personal history of other endocrine, nutritional and metabolic disease: Secondary | ICD-10-CM | POA: Diagnosis not present

## 2017-02-11 DIAGNOSIS — Z6833 Body mass index (BMI) 33.0-33.9, adult: Secondary | ICD-10-CM

## 2017-02-11 DIAGNOSIS — R5383 Other fatigue: Secondary | ICD-10-CM | POA: Diagnosis not present

## 2017-02-11 DIAGNOSIS — F439 Reaction to severe stress, unspecified: Secondary | ICD-10-CM

## 2017-02-11 DIAGNOSIS — E559 Vitamin D deficiency, unspecified: Secondary | ICD-10-CM

## 2017-02-11 DIAGNOSIS — R7989 Other specified abnormal findings of blood chemistry: Secondary | ICD-10-CM

## 2017-02-11 MED ORDER — SERTRALINE HCL 25 MG PO TABS: ORAL_TABLET | ORAL | 1 refills | Status: DC

## 2017-02-11 MED ORDER — PANTOPRAZOLE SODIUM 40 MG PO TBEC: 40.0000 mg | DELAYED_RELEASE_TABLET | Freq: Every day | ORAL | 2 refills | Status: DC

## 2017-02-11 NOTE — Progress Notes (Signed)
Pre-visit discussion using our clinic review tool. No additional management support is needed unless otherwise documented below in the visit note.  

## 2017-02-11 NOTE — Progress Notes (Signed)
Patient ID: Carol Jimenez, female   DOB: 1960-07-31, 56 y.o.   MRN: [id number]   Subjective:    Patient ID: Carol Jimenez, female    DOB: 09/02/1960, 56 y.o.   MRN: [id number]  HPI  Patient here for a scheduled follow up.  She was previously placed on effexor to help with some increased stress and hot flashes.  There was concern that it may be causing fatigue.  She has been trying to taper the medication.  When she tries to taper the dose, she notices dizziness.  See previous notes and her my chart message.  She likes how the effexor helps level things out.  We discussed changing the medication - overlapping and trying to taper off effexor and increasing the dose of the new medication.  She is agreeable.  Discussed other reasons for fatigue.  She does not exercise.  Not watching her diet.  Increased stress can contribute.  No chest pain.  No sob.  No acid reflux.  No abdominal pain.  Bowels moving.  She has tried cymbalta previously. Had trouble with dizziness while tapering off this medicatioin.     Past Medical History:  Diagnosis Date  . Allergy   . Hx: UTI (urinary tract infection)   . Hypothyroidism    Past Surgical History:  Procedure Laterality Date  . BREAST BIOPSY Left 2015   negative, stereotactic biopsy  . CESAREAN SECTION    . ENDOMETRIAL ABLATION  2000   Family History  Problem Relation Age of Onset  . Congestive Heart Failure Mother   . Diabetes Mother   . COPD Mother   . Hypertension Father   . Esophageal cancer Father 72       treated at [facility]  . Colon polyps Unknown   . Breast cancer Neg Hx    Social History   Social History  . Marital status: Married    Spouse name: N/A  . Number of children: N/A  . Years of education: N/A   Social History Main Topics  . Smoking status: Never Smoker  . Smokeless tobacco: Never Used  . Alcohol use 0.0 oz/week     Comment: rare  . Drug use: No  . Sexual activity: Not Asked   Other Topics Concern  . None   Social  History Narrative  . None    Outpatient Encounter Prescriptions as of 02/11/2017  Medication Sig  . estradiol (ESTRACE) 0.5 MG tablet take 1 to 2 tablets by mouth once daily  . hydrochlorothiazide (HYDRODIURIL) 25 MG tablet Take 1 tablet (25 mg total) by mouth daily.  . meloxicam (MOBIC) 15 MG tablet Take 15 mg by mouth daily.  . progesterone (PROMETRIUM) 100 MG capsule take 1 capsule by mouth once daily  . thyroid (ARMOUR THYROID) 90 MG tablet Take 1 tablet (90 mg total) by mouth daily.  Marland Kitchen thyroid (ARMOUR THYROID) 90 MG tablet Take 1 tablet (90 mg total) by mouth daily.  Marland Kitchen venlafaxine XR (EFFEXOR-XR) 37.5 MG 24 hr capsule take 1 capsule by mouth once daily with BREAKFAST  . pantoprazole (PROTONIX) 40 MG tablet Take 1 tablet (40 mg total) by mouth daily. Take 30 minutes before breakfast  . sertraline (ZOLOFT) 25 MG tablet Take on tablet per day for 2 weeks and then increase to 2 tablets per day   No facility-administered encounter medications on file as of 02/11/2017.     Review of Systems  Constitutional: Positive for fatigue. Negative for appetite change and unexpected weight change.  HENT: Negative for congestion and sinus pressure.   Respiratory: Negative for cough, chest tightness and shortness of breath.   Cardiovascular: Negative for chest pain, palpitations and leg swelling.  Gastrointestinal: Negative for abdominal pain, diarrhea, nausea and vomiting.  Genitourinary: Negative for difficulty urinating and dysuria.  Musculoskeletal: Negative for back pain and joint swelling.  Skin: Negative for color change and rash.  Neurological: Negative for dizziness, light-headedness and headaches.  Psychiatric/Behavioral: Negative for agitation and dysphoric mood.       Increased stress as outlined.         Objective:     Blood pressure rechecked by me:  132/84  Physical Exam  Constitutional: She appears well-developed and well-nourished. No distress.  HENT:  Nose: Nose normal.    Mouth/Throat: Oropharynx is clear and moist.  Neck: Neck supple. No thyromegaly present.  Cardiovascular: Normal rate and regular rhythm.   Pulmonary/Chest: Breath sounds normal. No respiratory distress. She has no wheezes.  Abdominal: Soft. Bowel sounds are normal. There is no tenderness.  Musculoskeletal: She exhibits no edema or tenderness.  Lymphadenopathy:    She has no cervical adenopathy.  Skin: No rash noted. No erythema.  Psychiatric: She has a normal mood and affect. Her behavior is normal.    BP 134/76 (BP Location: Left Arm, Patient Position: Sitting, Cuff Size: Normal)   Pulse 96   Temp 98.6 F (37 C) (Oral)   Resp 12   Ht '5\' 6"'$  (1.676 m)   Wt 206 lb 9.6 oz (93.7 kg)   LMP 12/26/2012   BMI 33.35 kg/m  Wt Readings from Last 3 Encounters:  02/11/17 206 lb 9.6 oz (93.7 kg)  11/09/16 207 lb (93.9 kg)  08/10/16 209 lb (94.8 kg)     Lab Results  Component Value Date   WBC 6.0 09/03/2016   HGB 15.3 (H) 09/03/2016   HCT 44.8 09/03/2016   PLT 224.0 09/03/2016   GLUCOSE 119 (H) 09/03/2016   CHOL 199 09/03/2016   TRIG 174.0 (H) 09/03/2016   HDL 50.10 09/03/2016   LDLDIRECT 146.0 10/29/2015   LDLCALC 114 (H) 09/03/2016   ALT 40 (H) 10/05/2016   AST 41 (H) 10/05/2016   NA 141 09/03/2016   K 3.7 09/03/2016   CL 101 09/03/2016   CREATININE 0.72 09/03/2016   BUN 15 09/03/2016   CO2 32 09/03/2016   TSH 2.09 04/05/2016   HGBA1C 5.8 09/03/2016    Mm Digital Screening  Result Date: 05/05/2016 CLINICAL DATA:  Screening. EXAM: DIGITAL SCREENING BILATERAL MAMMOGRAM WITH CAD COMPARISON:  Previous exam(s). ACR Breast Density Category b: There are scattered areas of fibroglandular density. FINDINGS: There are no findings suspicious for malignancy. Images were processed with CAD. IMPRESSION: No mammographic evidence of malignancy. A result letter of this screening mammogram will be mailed directly to the patient. RECOMMENDATION: Screening mammogram in one year.  (Code:SM-B-01Y) BI-RADS CATEGORY  1: Negative. Electronically Signed   By: Nolon Nations M.D.   On: 05/05/2016 09:50       Assessment & Plan:   Problem List Items Addressed This Visit    Abnormal liver function tests    Previous ultrasound revealed fatty liver.  Follow liver function tests.  Discussed diet and exercise.        Relevant Orders   Hepatic function panel   BMI 33.0-33.9,adult    Discussed diet and exercise.  Follow.        Fatigue    Felt to be multifactorial.  Discussed the need for diet  and exercise.  Will change effexor as outlined.        History of non anemic vitamin B12 deficiency    Follow B12 level.       Hypercholesterolemia    Low cholesterol diet and exercise.  Follow lipid panel.        Relevant Orders   CBC with Differential/Platelet   Hepatic function panel   Lipid panel   Basic metabolic panel   Hyperglycemia    Low carb diet and exercise.  Follow met b and a1c.        Relevant Orders   Hemoglobin A1c   Hypothyroid    On thyroid replacement.  Follow tsh.       Relevant Orders   TSH   Pseudoangiomatous stromal hyperplasia of breast    Previously saw Dr Bary Castilla.  Last mammogram 05/05/16 - Birads I.       Stress    On effexor.  Has helped. Trying to taper off as outlined.  Experiences increased dizziness.  Will start zoloft.  Have her overlap with the effexor for the first couple of weeks.  She will call with update.  Will plan to continue to taper effexor and taper up zoloft.  Pt in agreement.       Vitamin D deficiency    Continue vitamin D supplements.  Follow vitamin D level.            Einar Pheasant, MD

## 2017-02-13 ENCOUNTER — Encounter: Payer: Self-pay | Admitting: Internal Medicine

## 2017-02-13 NOTE — Assessment & Plan Note (Signed)
Continue vitamin D supplements.  Follow vitamin D level.   

## 2017-02-13 NOTE — Assessment & Plan Note (Signed)
Low cholesterol diet and exercise.  Follow lipid panel.   

## 2017-02-13 NOTE — Assessment & Plan Note (Signed)
Felt to be multifactorial.  Discussed the need for diet and exercise.  Will change effexor as outlined.

## 2017-02-13 NOTE — Assessment & Plan Note (Signed)
Follow B12 level.  

## 2017-02-13 NOTE — Assessment & Plan Note (Signed)
Low carb diet and exercise.  Follow met b and a1c.   

## 2017-02-13 NOTE — Assessment & Plan Note (Signed)
Previous ultrasound revealed fatty liver.  Follow liver function tests.  Discussed diet and exercise.

## 2017-02-13 NOTE — Assessment & Plan Note (Signed)
Discussed diet and exercise.  Follow.  

## 2017-02-13 NOTE — Assessment & Plan Note (Signed)
Previously saw Dr Lemar Livings.  Last mammogram 05/05/16 - Birads I.

## 2017-02-13 NOTE — Assessment & Plan Note (Signed)
On effexor.  Has helped. Trying to taper off as outlined.  Experiences increased dizziness.  Will start zoloft.  Have her overlap with the effexor for the first couple of weeks.  She will call with update.  Will plan to continue to taper effexor and taper up zoloft.  Pt in agreement.

## 2017-02-13 NOTE — Assessment & Plan Note (Signed)
On thyroid replacement.  Follow tsh.  

## 2017-02-24 ENCOUNTER — Encounter: Payer: Self-pay | Admitting: Internal Medicine

## 2017-03-13 ENCOUNTER — Telehealth: Payer: BLUE CROSS/BLUE SHIELD | Admitting: Family

## 2017-03-13 DIAGNOSIS — R399 Unspecified symptoms and signs involving the genitourinary system: Secondary | ICD-10-CM

## 2017-03-13 MED ORDER — NITROFURANTOIN MONOHYD MACRO 100 MG PO CAPS: 100.0000 mg | ORAL_CAPSULE | Freq: Two times a day (BID) | ORAL | 0 refills | Status: DC

## 2017-03-13 NOTE — Progress Notes (Signed)

## 2017-03-21 ENCOUNTER — Other Ambulatory Visit: Payer: Self-pay | Admitting: Internal Medicine

## 2017-03-21 ENCOUNTER — Encounter: Payer: Self-pay | Admitting: Internal Medicine

## 2017-03-22 MED ORDER — THYROID 90 MG PO TABS: 90.0000 mg | ORAL_TABLET | Freq: Every day | ORAL | 0 refills | Status: DC

## 2017-04-12 ENCOUNTER — Other Ambulatory Visit (INDEPENDENT_AMBULATORY_CARE_PROVIDER_SITE_OTHER): Payer: BLUE CROSS/BLUE SHIELD

## 2017-04-12 DIAGNOSIS — E7800 Pure hypercholesterolemia, unspecified: Secondary | ICD-10-CM | POA: Diagnosis not present

## 2017-04-12 DIAGNOSIS — R945 Abnormal results of liver function studies: Secondary | ICD-10-CM

## 2017-04-12 DIAGNOSIS — R7989 Other specified abnormal findings of blood chemistry: Secondary | ICD-10-CM

## 2017-04-12 DIAGNOSIS — E039 Hypothyroidism, unspecified: Secondary | ICD-10-CM | POA: Diagnosis not present

## 2017-04-12 DIAGNOSIS — R739 Hyperglycemia, unspecified: Secondary | ICD-10-CM

## 2017-04-12 LAB — CBC WITH DIFFERENTIAL/PLATELET
BASOS ABS: 0 10*3/uL (ref 0.0–0.1)
Basophils Relative: 0.8 % (ref 0.0–3.0)
EOS ABS: 0.1 10*3/uL (ref 0.0–0.7)
Eosinophils Relative: 1.6 % (ref 0.0–5.0)
HEMATOCRIT: 43.3 % (ref 36.0–46.0)
Hemoglobin: 14.7 g/dL (ref 12.0–15.0)
LYMPHS PCT: 24.4 % (ref 12.0–46.0)
Lymphs Abs: 1.4 10*3/uL (ref 0.7–4.0)
MCHC: 34 g/dL (ref 30.0–36.0)
MCV: 96.3 fl (ref 78.0–100.0)
Monocytes Absolute: 0.5 10*3/uL (ref 0.1–1.0)
Monocytes Relative: 8.8 % (ref 3.0–12.0)
Neutro Abs: 3.7 10*3/uL (ref 1.4–7.7)
Neutrophils Relative %: 64.4 % (ref 43.0–77.0)
Platelets: 216 10*3/uL (ref 150.0–400.0)
RBC: 4.5 Mil/uL (ref 3.87–5.11)
RDW: 13.4 % (ref 11.5–15.5)
WBC: 5.7 10*3/uL (ref 4.0–10.5)

## 2017-04-12 LAB — HEPATIC FUNCTION PANEL
ALK PHOS: 60 U/L (ref 39–117)
ALT: 39 U/L — AB (ref 0–35)
AST: 36 U/L (ref 0–37)
Albumin: 4.3 g/dL (ref 3.5–5.2)
BILIRUBIN DIRECT: 0.1 mg/dL (ref 0.0–0.3)
TOTAL PROTEIN: 7 g/dL (ref 6.0–8.3)
Total Bilirubin: 0.6 mg/dL (ref 0.2–1.2)

## 2017-04-12 LAB — LIPID PANEL
CHOL/HDL RATIO: 4
Cholesterol: 202 mg/dL — ABNORMAL HIGH (ref 0–200)
HDL: 48.5 mg/dL (ref >39.00)
NonHDL: 153.56
Triglycerides: 211 mg/dL — ABNORMAL HIGH (ref 0.0–149.0)
VLDL: 42.2 mg/dL — AB (ref 0.0–40.0)

## 2017-04-12 LAB — BASIC METABOLIC PANEL
BUN: 17 mg/dL (ref 6–23)
CALCIUM: 9.6 mg/dL (ref 8.4–10.5)
CO2: 28 mEq/L (ref 19–32)
Chloride: 101 mEq/L (ref 96–112)
Creatinine, Ser: 0.71 mg/dL (ref 0.40–1.20)
GFR: 90.15 mL/min (ref >60.00)
Glucose, Bld: 113 mg/dL — ABNORMAL HIGH (ref 70–99)
POTASSIUM: 4.2 meq/L (ref 3.5–5.1)
SODIUM: 140 meq/L (ref 135–145)

## 2017-04-12 LAB — TSH: TSH: 1.96 u[IU]/mL (ref 0.35–4.50)

## 2017-04-12 LAB — LDL CHOLESTEROL, DIRECT: LDL DIRECT: 130 mg/dL

## 2017-04-12 LAB — HEMOGLOBIN A1C: Hgb A1c MFr Bld: 5.7 % (ref 4.6–6.5)

## 2017-04-13 ENCOUNTER — Encounter: Payer: Self-pay | Admitting: Internal Medicine

## 2017-04-14 ENCOUNTER — Encounter: Payer: Self-pay | Admitting: Internal Medicine

## 2017-04-14 ENCOUNTER — Ambulatory Visit (INDEPENDENT_AMBULATORY_CARE_PROVIDER_SITE_OTHER): Payer: BLUE CROSS/BLUE SHIELD | Admitting: Internal Medicine

## 2017-04-14 VITALS — BP 130/80 | HR 79 | Temp 98.6°F | Resp 14 | Ht 66.0 in | Wt 206.2 lb

## 2017-04-14 DIAGNOSIS — E559 Vitamin D deficiency, unspecified: Secondary | ICD-10-CM | POA: Diagnosis not present

## 2017-04-14 DIAGNOSIS — F439 Reaction to severe stress, unspecified: Secondary | ICD-10-CM | POA: Diagnosis not present

## 2017-04-14 DIAGNOSIS — R945 Abnormal results of liver function studies: Secondary | ICD-10-CM

## 2017-04-14 DIAGNOSIS — Z1239 Encounter for other screening for malignant neoplasm of breast: Secondary | ICD-10-CM

## 2017-04-14 DIAGNOSIS — E039 Hypothyroidism, unspecified: Secondary | ICD-10-CM | POA: Diagnosis not present

## 2017-04-14 DIAGNOSIS — Z1231 Encounter for screening mammogram for malignant neoplasm of breast: Secondary | ICD-10-CM

## 2017-04-14 DIAGNOSIS — Z23 Encounter for immunization: Secondary | ICD-10-CM | POA: Diagnosis not present

## 2017-04-14 DIAGNOSIS — R739 Hyperglycemia, unspecified: Secondary | ICD-10-CM | POA: Diagnosis not present

## 2017-04-14 DIAGNOSIS — Z6833 Body mass index (BMI) 33.0-33.9, adult: Secondary | ICD-10-CM | POA: Diagnosis not present

## 2017-04-14 DIAGNOSIS — E7800 Pure hypercholesterolemia, unspecified: Secondary | ICD-10-CM | POA: Diagnosis not present

## 2017-04-14 DIAGNOSIS — R7989 Other specified abnormal findings of blood chemistry: Secondary | ICD-10-CM

## 2017-04-14 NOTE — Progress Notes (Signed)
Patient ID: Carol Jimenez, female   DOB: 1961-01-12, 56 y.o.   MRN: [id number]   Subjective:    Patient ID: Carol Jimenez, female    DOB: [birth year], 56 y.o.   MRN: [id number]  HPI  Patient here for a scheduled follow up.  She is off effexor.  No dizziness now.  On zoloft.  Feels this helps some with the increased stress and anxiety, but does not feel helps as much as the effexor.  Only taking 55m q day.  Never increased to 559mq day.  Discussed with her today.  Will increase.  Tries to stay active.  Not exercising regularly.  We discussed the importance of regular exercise.  She is planning for left knee arthroscopic surgery 05/10/17.  No chest pain.  No sob.  No acid reflux.  No abdominal pain.  Bowels moving.     Past Medical History:  Diagnosis Date  . Allergy   . Hx: UTI (urinary tract infection)   . Hypothyroidism    Past Surgical History:  Procedure Laterality Date  . BREAST BIOPSY Left 2015   negative, stereotactic biopsy  . CESAREAN SECTION    . ENDOMETRIAL ABLATION  2000   Family History  Problem Relation Age of Onset  . Congestive Heart Failure Mother   . Diabetes Mother   . COPD Mother   . Hypertension Father   . Esophageal cancer Father [id number]     [facility]. Colon polyps Unknown   . Breast cancer Neg Hx    Social History   Social History  . Marital status: Married    Spouse name: N/A  . Number of children: N/A  . Years of education: N/A   Social History Main Topics  . Smoking status: Never Smoker  . Smokeless tobacco: Never Used  . Alcohol use 0.0 oz/week     Comment: rare  . Drug use: No  . Sexual activity: Not Asked   Other Topics Concern  . None   Social History Narrative  . None    Outpatient Encounter Prescriptions as of 04/14/2017  Medication Sig  . estradiol (ESTRACE) 0.5 MG tablet take 1 to 2 tablets by mouth once daily  . hydrochlorothiazide (HYDRODIURIL) 25 MG tablet Take 1 tablet (25 mg total) by mouth daily.  .  meloxicam (MOBIC) 15 MG tablet Take 15 mg by mouth daily.  . nitrofurantoin, macrocrystal-monohydrate, (MACROBID) 100 MG capsule Take 1 capsule (100 mg total) by mouth 2 (two) times daily. 1 po BId  . pantoprazole (PROTONIX) 40 MG tablet Take 1 tablet (40 mg total) by mouth daily. Take 30 minutes before breakfast  . progesterone (PROMETRIUM) 100 MG capsule take 1 capsule by mouth once daily  . sertraline (ZOLOFT) 25 MG tablet Take on tablet per day for 2 weeks and then increase to 2 tablets per day  . thyroid (ARMOUR THYROID) 90 MG tablet Take 1 tablet (90 mg total) by mouth daily.  . Marland Kitchenhyroid (ARMOUR THYROID) 90 MG tablet Take 1 tablet (90 mg total) by mouth daily.  . [DISCONTINUED] venlafaxine XR (EFFEXOR-XR) 37.5 MG 24 hr capsule take 1 capsule by mouth once daily with BREAKFAST   No facility-administered encounter medications on file as of 04/14/2017.     Review of Systems  Constitutional: Negative for appetite change and unexpected weight change.  HENT: Negative for congestion and sinus pressure.   Respiratory: Negative for cough, chest tightness and shortness of breath.   Cardiovascular: Negative for  chest pain, palpitations and leg swelling.  Gastrointestinal: Negative for abdominal pain, diarrhea, nausea and vomiting.  Genitourinary: Negative for difficulty urinating and dysuria.  Musculoskeletal: Negative for back pain and myalgias.  Skin: Negative for color change and rash.  Neurological: Negative for dizziness, light-headedness and headaches.  Psychiatric/Behavioral: Negative for agitation and dysphoric mood.       Objective:    Physical Exam  Constitutional: She appears well-developed and well-nourished. No distress.  HENT:  Nose: Nose normal.  Mouth/Throat: Oropharynx is clear and moist.  Neck: Neck supple. No thyromegaly present.  Cardiovascular: Normal rate and regular rhythm.   Pulmonary/Chest: Breath sounds normal. No respiratory distress. She has no wheezes.    Abdominal: Soft. Bowel sounds are normal. There is no tenderness.  Musculoskeletal: She exhibits no edema or tenderness.  Lymphadenopathy:    She has no cervical adenopathy.  Skin: No rash noted. No erythema.  Psychiatric: She has a normal mood and affect. Her behavior is normal.    BP 130/80 (BP Location: Left Arm, Patient Position: Sitting, Cuff Size: Normal)   Pulse 79   Temp 98.6 F (37 C) (Oral)   Resp 14   Ht '5\' 6"'  (1.676 m)   Wt 206 lb 3.2 oz (93.5 kg)   LMP 12/26/2012   SpO2 98%   BMI 33.28 kg/m  Wt Readings from Last 3 Encounters:  04/14/17 206 lb 3.2 oz (93.5 kg)  02/11/17 206 lb 9.6 oz (93.7 kg)  11/09/16 207 lb (93.9 kg)     Lab Results  Component Value Date   WBC 5.7 04/12/2017   HGB 14.7 04/12/2017   HCT 43.3 04/12/2017   PLT 216.0 04/12/2017   GLUCOSE 113 (H) 04/12/2017   CHOL 202 (H) 04/12/2017   TRIG 211.0 (H) 04/12/2017   HDL 48.50 04/12/2017   LDLDIRECT 130.0 04/12/2017   LDLCALC 114 (H) 09/03/2016   ALT 39 (H) 04/12/2017   AST 36 04/12/2017   NA 140 04/12/2017   K 4.2 04/12/2017   CL 101 04/12/2017   CREATININE 0.71 04/12/2017   BUN 17 04/12/2017   CO2 28 04/12/2017   TSH 1.96 04/12/2017   HGBA1C 5.7 04/12/2017    Mm Digital Screening  Result Date: 05/05/2016 CLINICAL DATA:  Screening. EXAM: DIGITAL SCREENING BILATERAL MAMMOGRAM WITH CAD COMPARISON:  Previous exam(s). ACR Breast Density Category b: There are scattered areas of fibroglandular density. FINDINGS: There are no findings suspicious for malignancy. Images were processed with CAD. IMPRESSION: No mammographic evidence of malignancy. A result letter of this screening mammogram will be mailed directly to the patient. RECOMMENDATION: Screening mammogram in one year. (Code:SM-B-01Y) BI-RADS CATEGORY  1: Negative. Electronically Signed   By: Nolon Nations M.D.   On: 05/05/2016 09:50       Assessment & Plan:   Problem List Items Addressed This Visit    Abnormal liver function tests     Previous ultrasound revealed fatty liver.  Discussed diet and exercise.  Follow liver panel.        BMI 33.0-33.9,adult    Discussed diet and exercise.  Follow.        Hypercholesterolemia    Low cholesterol diet and exercise.  Follow lipid panel.        Hyperglycemia    Low carb diet and exercise.  Follow met b and a1c.        Hypothyroid    On thyroid replacement.  Follow tsh.        Stress    Increased stress  as outlined.  Off effexor.  On zoloft.  Tolerating.  No dizziness now.  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  Only on 38m q day.  Increase zoloft to 578mq day.  Follow.        Vitamin D deficiency    Follow vitamin D level.        Other Visit Diagnoses    Need for Tdap vaccination    -  Primary   Need for immunization against influenza       Relevant Orders   Flu Vaccine QUAD 36+ mos IM (Completed)   Screening for breast cancer       Relevant Orders   MM DIGITAL SCREENING BILATERAL       SCEinar PheasantMD

## 2017-04-17 ENCOUNTER — Encounter: Payer: Self-pay | Admitting: Internal Medicine

## 2017-04-17 NOTE — Assessment & Plan Note (Signed)
Previous ultrasound revealed fatty liver.  Discussed diet and exercise.  Follow liver panel.   

## 2017-04-17 NOTE — Assessment & Plan Note (Signed)
Low carb diet and exercise.  Follow met b and a1c.   

## 2017-04-17 NOTE — Assessment & Plan Note (Signed)
Discussed diet and exercise.  Follow.  

## 2017-04-17 NOTE — Assessment & Plan Note (Signed)
Increased stress as outlined.  Off effexor.  On zoloft.  Tolerating.  No dizziness now.  She does not feel the zoloft works as well as Careers information officereffexor.  Only on 25mg  q day.  Increase zoloft to 50mg  q day.  Follow.

## 2017-04-17 NOTE — Assessment & Plan Note (Signed)
Follow vitamin D level.  

## 2017-04-17 NOTE — Assessment & Plan Note (Signed)
Low cholesterol diet and exercise.  Follow lipid panel.   

## 2017-04-17 NOTE — Assessment & Plan Note (Signed)
On thyroid replacement.  Follow tsh.  

## 2017-04-18 ENCOUNTER — Other Ambulatory Visit: Payer: Self-pay | Admitting: Internal Medicine

## 2017-05-06 ENCOUNTER — Ambulatory Visit
Admission: RE | Admit: 2017-05-06 | Discharge: 2017-05-06 | Disposition: A | Discharge status: Home / Self Care | Payer: BLUE CROSS/BLUE SHIELD | Source: Ambulatory Visit | Attending: Internal Medicine | Admitting: Internal Medicine

## 2017-05-06 DIAGNOSIS — Z1231 Encounter for screening mammogram for malignant neoplasm of breast: Secondary | ICD-10-CM | POA: Insufficient documentation

## 2017-05-06 DIAGNOSIS — Z1239 Encounter for other screening for malignant neoplasm of breast: Secondary | ICD-10-CM

## 2017-05-13 ENCOUNTER — Encounter: Payer: Self-pay | Admitting: Internal Medicine

## 2017-05-13 ENCOUNTER — Other Ambulatory Visit: Payer: Self-pay | Admitting: Internal Medicine

## 2017-05-16 MED ORDER — SERTRALINE HCL 25 MG PO TABS: ORAL_TABLET | ORAL | 1 refills | Status: DC

## 2017-06-23 ENCOUNTER — Other Ambulatory Visit: Payer: Self-pay | Admitting: Internal Medicine

## 2017-07-04 ENCOUNTER — Encounter: Payer: Self-pay | Admitting: Internal Medicine

## 2017-07-04 ENCOUNTER — Ambulatory Visit: Payer: BLUE CROSS/BLUE SHIELD | Admitting: Internal Medicine

## 2017-07-04 DIAGNOSIS — R7989 Other specified abnormal findings of blood chemistry: Secondary | ICD-10-CM

## 2017-07-04 DIAGNOSIS — E7800 Pure hypercholesterolemia, unspecified: Secondary | ICD-10-CM

## 2017-07-04 DIAGNOSIS — F439 Reaction to severe stress, unspecified: Secondary | ICD-10-CM | POA: Diagnosis not present

## 2017-07-04 DIAGNOSIS — E559 Vitamin D deficiency, unspecified: Secondary | ICD-10-CM | POA: Diagnosis not present

## 2017-07-04 DIAGNOSIS — Z6832 Body mass index (BMI) 32.0-32.9, adult: Secondary | ICD-10-CM

## 2017-07-04 DIAGNOSIS — R739 Hyperglycemia, unspecified: Secondary | ICD-10-CM

## 2017-07-04 DIAGNOSIS — R945 Abnormal results of liver function studies: Secondary | ICD-10-CM | POA: Diagnosis not present

## 2017-07-04 DIAGNOSIS — E039 Hypothyroidism, unspecified: Secondary | ICD-10-CM | POA: Diagnosis not present

## 2017-07-04 NOTE — Progress Notes (Signed)
Pre visit review using our clinic review tool, if applicable. No additional management support is needed unless otherwise documented below in the visit note. 

## 2017-07-04 NOTE — Progress Notes (Signed)
Patient ID: Carol Jimenez, female   DOB: 1961-04-17, 57 y.o.   MRN: [id number]   Subjective:    Patient ID: Carol Jimenez, female    DOB: 10/11/1960, 57 y.o.   MRN: [id number]  HPI  Patient here for a scheduled follow up.  She is s/p left knee arthroscopy.  Left knee is doing better.  Started having some increased discomfort in her right knee.  Is s/p injection.  Better.  She is taking zoloft.  Increased to 65m q day.  Felt was a little too much.  Now on 275mq day and feel she is doing better.  Energy better.  States her blood pressures are averaging 120s/76-80.  No chest pain.  No sob.  No acid reflux.  No abdominal pain.  Bowels moving.     Past Medical History:  Diagnosis Date  . Allergy   . Hx: UTI (urinary tract infection)   . Hypothyroidism    Past Surgical History:  Procedure Laterality Date  . BREAST BIOPSY Left 2015   negative, stereotactic biopsy  . CESAREAN SECTION    . ENDOMETRIAL ABLATION  2000   Family History  Problem Relation Age of Onset  . Congestive Heart Failure Mother   . Diabetes Mother   . COPD Mother   . Hypertension Father   . Esophageal cancer Father [id number]     [facility]   . Breast cancer Neg Hx    Social History   Socioeconomic History  . Marital status: Married    Spouse name: None  . Number of children: None  . Years of education: None  . Highest education level: None  Social Needs  . Financial resource strain: None  . Food insecurity - worry: None  . Food insecurity - inability: None  . Transportation needs - medical: None  . Transportation needs - non-medical: None  Occupational History  . None  Tobacco Use  . Smoking status: Never Smoker  . Smokeless tobacco: Never Used  Substance and Sexual Activity  . Alcohol use: Yes    Alcohol/week: 0.0 oz    Comment: rare  . Drug use: No  . Sexual activity: None  Other Topics Concern  . None  Social History Narrative  . None    Outpatient Encounter  Medications as of 07/04/2017  Medication Sig  . ARMOUR THYROID 90 MG tablet take 1 tablet by mouth once daily  . estradiol (ESTRACE) 0.5 MG tablet take 1 to 2 tablets by mouth once daily  . hydrochlorothiazide (HYDRODIURIL) 25 MG tablet Take 1 tablet (25 mg total) by mouth daily.  . meloxicam (MOBIC) 15 MG tablet Take 15 mg by mouth daily.  . nitrofurantoin, macrocrystal-monohydrate, (MACROBID) 100 MG capsule Take 1 capsule (100 mg total) by mouth 2 (two) times daily. 1 po BId  . pantoprazole (PROTONIX) 40 MG tablet Take 1 tablet (40 mg total) by mouth daily. Take 30 minutes before breakfast  . progesterone (PROMETRIUM) 100 MG capsule take 1 capsule by mouth once daily  . sertraline (ZOLOFT) 25 MG tablet take 1 tablet by mouth once daily for 2 weeks then take 2 tablets daily  . thyroid (ARMOUR THYROID) 90 MG tablet Take 1 tablet (90 mg total) by mouth daily.   No facility-administered encounter medications on file as of 07/04/2017.     Review of Systems  Constitutional: Negative for appetite change and unexpected weight change.  HENT: Negative for congestion and sinus pressure.  Respiratory: Negative for cough, chest tightness and shortness of breath.   Cardiovascular: Negative for chest pain, palpitations and leg swelling.  Gastrointestinal: Negative for abdominal pain, diarrhea, nausea and vomiting.  Genitourinary: Negative for difficulty urinating and dysuria.  Musculoskeletal: Negative for joint swelling and myalgias.  Skin: Negative for color change and rash.  Neurological: Negative for dizziness, light-headedness and headaches.  Psychiatric/Behavioral: Negative for agitation and dysphoric mood.       Objective:     Blood pressure rechecked by me;  118/76  Physical Exam  Constitutional: She appears well-developed and well-nourished. No distress.  HENT:  Nose: Nose normal.  Mouth/Throat: Oropharynx is clear and moist.  Neck: Neck supple. No thyromegaly present.    Cardiovascular: Normal rate and regular rhythm.  Pulmonary/Chest: Breath sounds normal. No respiratory distress. She has no wheezes.  Abdominal: Soft. Bowel sounds are normal. There is no tenderness.  Musculoskeletal: She exhibits no edema or tenderness.  Lymphadenopathy:    She has no cervical adenopathy.  Skin: No rash noted. No erythema.  Psychiatric: She has a normal mood and affect. Her behavior is normal.    BP 124/76   Pulse 75   Temp 98.7 F (37.1 C) (Oral)   Ht _0  (1.676 m)   Wt 200 lb 6.4 oz (90.9 kg)   LMP 12/26/2012   SpO2 95%   BMI 32.35 kg/m  Wt Readings from Last 3 Encounters:  07/04/17 200 lb 6.4 oz (90.9 kg)  04/14/17 206 lb 3.2 oz (93.5 kg)  02/11/17 206 lb 9.6 oz (93.7 kg)     Lab Results  Component Value Date   WBC 5.7 04/12/2017   HGB 14.7 04/12/2017   HCT 43.3 04/12/2017   PLT 216.0 04/12/2017   GLUCOSE 113 (H) 04/12/2017   CHOL 202 (H) 04/12/2017   TRIG 211.0 (H) 04/12/2017   HDL 48.50 04/12/2017   LDLDIRECT 130.0 04/12/2017   LDLCALC 114 (H) 09/03/2016   ALT 39 (H) 04/12/2017   AST 36 04/12/2017   NA 140 04/12/2017   K 4.2 04/12/2017   CL 101 04/12/2017   CREATININE 0.71 04/12/2017   BUN 17 04/12/2017   CO2 28 04/12/2017   TSH 1.96 04/12/2017   HGBA1C 5.7 04/12/2017    Mm Digital Screening Bilateral  Result Date: 05/06/2017 CLINICAL DATA:  Screening. EXAM: DIGITAL SCREENING BILATERAL MAMMOGRAM WITH CAD COMPARISON:  Previous exam(s). ACR Breast Density Category b: There are scattered areas of fibroglandular density. FINDINGS: There are no findings suspicious for malignancy. Images were processed with CAD. IMPRESSION: No mammographic evidence of malignancy. A result letter of this screening mammogram will be mailed directly to the patient. RECOMMENDATION: Screening mammogram in one year. (Code:SM-B-01Y) BI-RADS CATEGORY  1: Negative. Electronically Signed   By: Ammie Ferrier M.D.   On: 05/06/2017 15:11       Assessment & Plan:    Problem List Items Addressed This Visit    Abnormal liver function tests    Previous ultrasound revealed fatty liver.  Diet and exercise.  Follow liver panel.        BMI 32.0-32.9,adult    Diet and exercise.  Follow.       Hypercholesterolemia    Low cholesterol diet and exercise.  Follow lipid panel.        Relevant Orders   Hepatic function panel   Lipid panel   Hyperglycemia    Low carb diet and exercise.  Follow met b and a1c.        Relevant  Orders   Hemoglobin W4Y   Basic metabolic panel   Hypothyroid    On thyroid replacement.  Follow tsh.        Stress    Doing better.  On 39m zoloft.  Feels better.  Follow.        Vitamin D deficiency    Follow vitamin D level.           SEinar Pheasant MD

## 2017-07-07 ENCOUNTER — Encounter: Payer: Self-pay | Admitting: Internal Medicine

## 2017-07-07 NOTE — Assessment & Plan Note (Signed)
Previous ultrasound revealed fatty liver.  Diet and exercise.  Follow liver panel.   

## 2017-07-07 NOTE — Assessment & Plan Note (Signed)
Low cholesterol diet and exercise.  Follow lipid panel.   

## 2017-07-07 NOTE — Assessment & Plan Note (Signed)
Diet and exercise.  Follow.  

## 2017-07-07 NOTE — Assessment & Plan Note (Signed)
Doing better.  On 25mg  zoloft.  Feels better.  Follow.

## 2017-07-07 NOTE — Assessment & Plan Note (Signed)
On thyroid replacement.  Follow tsh.  

## 2017-07-07 NOTE — Assessment & Plan Note (Signed)
Follow vitamin D level.  

## 2017-07-07 NOTE — Assessment & Plan Note (Signed)
Low carb diet and exercise.  Follow met b and a1c.   

## 2017-09-20 ENCOUNTER — Other Ambulatory Visit: Payer: Self-pay | Admitting: Internal Medicine

## 2017-09-20 MED ORDER — THYROID 90 MG PO TABS: 90.0000 mg | ORAL_TABLET | Freq: Every day | ORAL | 0 refills | Status: DC

## 2017-10-20 ENCOUNTER — Other Ambulatory Visit: Payer: Self-pay | Admitting: Internal Medicine

## 2017-10-20 NOTE — Telephone Encounter (Signed)
Copied from CRM [id number]. Topic: Quick Communication - Rx Refill/Question >> Oct 20, 2017  6:13 PM Zada GirtLander, [place] wrote: Medication: progesterone (PROMETRIUM) 100 MG capsule, hydrochlorothiazide (HYDRODIURIL) 25 MG tablet Has the patient contacted their pharmacy? Yes.   (Agent: If no, request that the patient contact the pharmacy for the refill.) Preferred Pharmacy (with phone number or street name): [id number] - [place] - [address] [facility] [address] [facility] [address] [place] [id number] Phone: [phone] Fax: [phone] Agent: Please be advised that RX refills may take up to 3 business days. We ask that you follow-up with your pharmacy.

## 2017-10-22 MED ORDER — HYDROCHLOROTHIAZIDE 25 MG PO TABS: 25.0000 mg | ORAL_TABLET | Freq: Every day | ORAL | 0 refills | Status: DC

## 2017-10-22 MED ORDER — PROGESTERONE MICRONIZED 100 MG PO CAPS
100.0000 mg | ORAL_CAPSULE | Freq: Every day | ORAL | 0 refills | Status: DC
Start: 2017-10-22 — End: 2017-11-25

## 2017-11-02 ENCOUNTER — Other Ambulatory Visit: Payer: BLUE CROSS/BLUE SHIELD

## 2017-11-04 ENCOUNTER — Encounter: Payer: BLUE CROSS/BLUE SHIELD | Admitting: Internal Medicine

## 2017-11-25 ENCOUNTER — Other Ambulatory Visit: Payer: Self-pay | Admitting: Internal Medicine

## 2017-12-16 ENCOUNTER — Other Ambulatory Visit: Payer: Self-pay | Admitting: Internal Medicine

## 2018-01-16 ENCOUNTER — Other Ambulatory Visit (INDEPENDENT_AMBULATORY_CARE_PROVIDER_SITE_OTHER): Payer: BLUE CROSS/BLUE SHIELD

## 2018-01-16 DIAGNOSIS — R739 Hyperglycemia, unspecified: Secondary | ICD-10-CM | POA: Diagnosis not present

## 2018-01-16 DIAGNOSIS — E7800 Pure hypercholesterolemia, unspecified: Secondary | ICD-10-CM | POA: Diagnosis not present

## 2018-01-16 LAB — LIPID PANEL
Cholesterol: 189 mg/dL (ref 0–200)
HDL: 53.3 mg/dL (ref >39.00)
LDL Cholesterol: 108 mg/dL — ABNORMAL HIGH (ref 0–99)
NONHDL: 135.57
Total CHOL/HDL Ratio: 4
Triglycerides: 137 mg/dL (ref 0.0–149.0)
VLDL: 27.4 mg/dL (ref 0.0–40.0)

## 2018-01-16 LAB — HEMOGLOBIN A1C: HEMOGLOBIN A1C: 5.9 % (ref 4.6–6.5)

## 2018-01-16 LAB — BASIC METABOLIC PANEL
BUN: 11 mg/dL (ref 6–23)
CO2: 30 meq/L (ref 19–32)
Calcium: 9.3 mg/dL (ref 8.4–10.5)
Chloride: 97 mEq/L (ref 96–112)
Creatinine, Ser: 0.76 mg/dL (ref 0.40–1.20)
GFR: 83.11 mL/min (ref >60.00)
GLUCOSE: 117 mg/dL — AB (ref 70–99)
POTASSIUM: 3.9 meq/L (ref 3.5–5.1)
SODIUM: 135 meq/L (ref 135–145)

## 2018-01-16 LAB — HEPATIC FUNCTION PANEL
ALBUMIN: 4.2 g/dL (ref 3.5–5.2)
ALT: 41 U/L — ABNORMAL HIGH (ref 0–35)
AST: 40 U/L — AB (ref 0–37)
Alkaline Phosphatase: 64 U/L (ref 39–117)
Bilirubin, Direct: 0.1 mg/dL (ref 0.0–0.3)
Total Bilirubin: 0.6 mg/dL (ref 0.2–1.2)
Total Protein: 7.7 g/dL (ref 6.0–8.3)

## 2018-01-17 ENCOUNTER — Encounter: Payer: Self-pay | Admitting: Internal Medicine

## 2018-01-19 ENCOUNTER — Encounter: Payer: Self-pay | Admitting: Internal Medicine

## 2018-01-19 ENCOUNTER — Ambulatory Visit (INDEPENDENT_AMBULATORY_CARE_PROVIDER_SITE_OTHER): Payer: BLUE CROSS/BLUE SHIELD | Admitting: Internal Medicine

## 2018-01-19 ENCOUNTER — Other Ambulatory Visit (HOSPITAL_COMMUNITY)
Admission: RE | Admit: 2018-01-19 | Discharge: 2018-01-19 | Disposition: A | Discharge status: Home / Self Care | Payer: BLUE CROSS/BLUE SHIELD | Source: Ambulatory Visit | Attending: Internal Medicine | Admitting: Internal Medicine

## 2018-01-19 VITALS — BP 128/70 | HR 84 | Temp 97.9°F | Resp 18 | Ht 66.0 in | Wt 205.8 lb

## 2018-01-19 DIAGNOSIS — Z8639 Personal history of other endocrine, nutritional and metabolic disease: Secondary | ICD-10-CM | POA: Diagnosis not present

## 2018-01-19 DIAGNOSIS — Z0000 Encounter for general adult medical examination without abnormal findings: Secondary | ICD-10-CM | POA: Diagnosis not present

## 2018-01-19 DIAGNOSIS — R945 Abnormal results of liver function studies: Secondary | ICD-10-CM | POA: Diagnosis not present

## 2018-01-19 DIAGNOSIS — R739 Hyperglycemia, unspecified: Secondary | ICD-10-CM | POA: Diagnosis not present

## 2018-01-19 DIAGNOSIS — Z6833 Body mass index (BMI) 33.0-33.9, adult: Secondary | ICD-10-CM

## 2018-01-19 DIAGNOSIS — E7800 Pure hypercholesterolemia, unspecified: Secondary | ICD-10-CM | POA: Diagnosis not present

## 2018-01-19 DIAGNOSIS — J329 Chronic sinusitis, unspecified: Secondary | ICD-10-CM

## 2018-01-19 DIAGNOSIS — Z124 Encounter for screening for malignant neoplasm of cervix: Secondary | ICD-10-CM | POA: Insufficient documentation

## 2018-01-19 DIAGNOSIS — F439 Reaction to severe stress, unspecified: Secondary | ICD-10-CM

## 2018-01-19 DIAGNOSIS — M79675 Pain in left toe(s): Secondary | ICD-10-CM

## 2018-01-19 DIAGNOSIS — E559 Vitamin D deficiency, unspecified: Secondary | ICD-10-CM

## 2018-01-19 DIAGNOSIS — E039 Hypothyroidism, unspecified: Secondary | ICD-10-CM

## 2018-01-19 DIAGNOSIS — R7989 Other specified abnormal findings of blood chemistry: Secondary | ICD-10-CM

## 2018-01-19 MED ORDER — MUPIROCIN 2 % EX OINT: TOPICAL_OINTMENT | CUTANEOUS | 0 refills | Status: DC

## 2018-01-19 MED ORDER — AMOXICILLIN 875 MG PO TABS: 875.0000 mg | ORAL_TABLET | Freq: Two times a day (BID) | ORAL | 0 refills | Status: DC

## 2018-01-19 NOTE — Patient Instructions (Addendum)
Saline nasal spray - flush nose at least 2-3x/day  nasacort nasal spray - 2 sprays each nostril one time per day.  Do this in the evening.    Robitussin DM - twice a day as needed for cough and congestion  If you take the antibiotic, then start a probiotic daily.  Take the probiotic while you are on the antibiotic and for two weeks after completing the antibiotic.

## 2018-01-19 NOTE — Progress Notes (Signed)
Patient ID: Carol Jimenez, female   DOB: 08/31/1960, 57 y.o.   MRN: [id number]   Subjective:    Patient ID: Carol Jimenez, female    DOB: 05-Dec-1960, 57 y.o.   MRN: [id number]  HPI  Patient here for a physical exam.  She reports that over the last week, she has been having problems with increased sinus pressure and nasal congestion.  Productive yellow mucus.  No chest congestion, but some cough - productive - probably from drainage.  Glands sore.  Just started claritin.  Took a sudafed.  No chest pain.  No sob.  No acid reflux.  No abdominal pain.  Bowels moving.  On zoloft.  Feels helping level things out.  Still with some fatigue.  She does report persistent problems with her left great toe.  Increased erythema - tender.  Nail embedded.  Has been soaking and using neosporin.     Past Medical History:  Diagnosis Date  . Allergy   . Hx: UTI (urinary tract infection)   . Hypothyroidism    Past Surgical History:  Procedure Laterality Date  . BREAST BIOPSY Left 2015   negative, stereotactic biopsy  . CESAREAN SECTION    . ENDOMETRIAL ABLATION  2000   Family History  Problem Relation Age of Onset  . Congestive Heart Failure Mother   . Diabetes Mother   . COPD Mother   . Hypertension Father   . Esophageal cancer Father 56       treated at [facility]  . Colon polyps Unknown   . Breast cancer Neg Hx    Social History   Socioeconomic History  . Marital status: Married    Spouse name: Not on file  . Number of children: Not on file  . Years of education: Not on file  . Highest education level: Not on file  Occupational History  . Not on file  Social Needs  . Financial resource strain: Not on file  . Food insecurity:    Worry: Not on file    Inability: Not on file  . Transportation needs:    Medical: Not on file    Non-medical: Not on file  Tobacco Use  . Smoking status: Never Smoker  . Smokeless tobacco: Never Used  Substance and Sexual Activity  . Alcohol use: Yes   Alcohol/week: 0.0 oz    Comment: rare  . Drug use: No  . Sexual activity: Not on file  Lifestyle  . Physical activity:    Days per week: Not on file    Minutes per session: Not on file  . Stress: Not on file  Relationships  . Social connections:    Talks on phone: Not on file    Gets together: Not on file    Attends religious service: Not on file    Active member of club or organization: Not on file    Attends meetings of clubs or organizations: Not on file    Relationship status: Not on file  Other Topics Concern  . Not on file  Social History Narrative  . Not on file    Outpatient Encounter Medications as of 01/19/2018  Medication Sig  . amoxicillin (AMOXIL) 875 MG tablet Take 1 tablet (875 mg total) by mouth 2 (two) times daily.  Marland Kitchen estradiol (ESTRACE) 0.5 MG tablet take 1 to 2 tablets by mouth once daily  . hydrochlorothiazide (HYDRODIURIL) 25 MG tablet Take 1 tablet (25 mg total) by mouth daily.  . meloxicam (MOBIC) 15 MG  tablet Take 15 mg by mouth daily.  . mupirocin ointment (BACTROBAN) 2 % Apply to affected area on toe bid  . NP THYROID 90 MG tablet TAKE 1 TABLET(90 MG) BY MOUTH DAILY  . pantoprazole (PROTONIX) 40 MG tablet Take 1 tablet (40 mg total) by mouth daily. Take 30 minutes before breakfast  . progesterone (PROMETRIUM) 100 MG capsule TAKE 1 CAPSULE(100 MG) BY MOUTH DAILY  . sertraline (ZOLOFT) 25 MG tablet take 1 tablet by mouth once daily for 2 weeks then take 2 tablets daily  . [DISCONTINUED] nitrofurantoin, macrocrystal-monohydrate, (MACROBID) 100 MG capsule Take 1 capsule (100 mg total) by mouth 2 (two) times daily. 1 po BId  . [DISCONTINUED] thyroid (ARMOUR THYROID) 90 MG tablet Take 1 tablet (90 mg total) by mouth daily.   No facility-administered encounter medications on file as of 01/19/2018.     Review of Systems  Constitutional: Negative for appetite change and unexpected weight change.  HENT: Positive for congestion, postnasal drip and sinus  pressure.        Previous sore throat.  Better.    Eyes: Negative for pain and visual disturbance.  Respiratory: Positive for cough. Negative for chest tightness and shortness of breath.   Cardiovascular: Negative for chest pain, palpitations and leg swelling.  Gastrointestinal: Negative for abdominal pain, diarrhea, nausea and vomiting.  Genitourinary: Negative for difficulty urinating and dysuria.  Musculoskeletal: Negative for joint swelling and myalgias.  Skin: Negative for color change and rash.  Neurological: Negative for dizziness, light-headedness and headaches.  Hematological: Negative for adenopathy. Does not bruise/bleed easily.  Psychiatric/Behavioral: Negative for agitation and dysphoric mood.       Objective:     Blood pressure rechecked by me:  118/74  Physical Exam  Constitutional: She is oriented to person, place, and time. She appears well-developed and well-nourished. No distress.  HENT:  Nose: Nose normal.  Mouth/Throat: Oropharynx is clear and moist.  Eyes: Right eye exhibits no discharge. Left eye exhibits no discharge. No scleral icterus.  Neck: Neck supple. No thyromegaly present.  Cardiovascular: Normal rate and regular rhythm.  Pulmonary/Chest: Breath sounds normal. No accessory muscle usage. No tachypnea. No respiratory distress. She has no decreased breath sounds. She has no wheezes. She has no rhonchi. Right breast exhibits no inverted nipple, no mass, no nipple discharge and no tenderness (no axillary adenopathy). Left breast exhibits no inverted nipple, no mass, no nipple discharge and no tenderness (no axilarry adenopathy).  Abdominal: Soft. Bowel sounds are normal. There is no tenderness.  Musculoskeletal: She exhibits no edema or tenderness.  Lymphadenopathy:    She has no cervical adenopathy.  Neurological: She is alert and oriented to person, place, and time.  Skin: No rash noted. No erythema.  Psychiatric: She has a normal mood and affect. Her  behavior is normal.    BP 128/70 (BP Location: Left Arm, Patient Position: Sitting, Cuff Size: Large)   Pulse 84   Temp 97.9 F (36.6 C) (Oral)   Resp 18   Ht _0  (1.676 m)   Wt 205 lb 12.8 oz (93.4 kg)   LMP 12/26/2012   SpO2 97%   BMI 33.22 kg/m  Wt Readings from Last 3 Encounters:  01/19/18 205 lb 12.8 oz (93.4 kg)  07/04/17 200 lb 6.4 oz (90.9 kg)  04/14/17 206 lb 3.2 oz (93.5 kg)     Lab Results  Component Value Date   WBC 5.7 04/12/2017   HGB 14.7 04/12/2017   HCT 43.3 04/12/2017  PLT 216.0 04/12/2017   GLUCOSE 117 (H) 01/16/2018   CHOL 189 01/16/2018   TRIG 137.0 01/16/2018   HDL 53.30 01/16/2018   LDLDIRECT 130.0 04/12/2017   LDLCALC 108 (H) 01/16/2018   ALT 41 (H) 01/16/2018   AST 40 (H) 01/16/2018   NA 135 01/16/2018   K 3.9 01/16/2018   CL 97 01/16/2018   CREATININE 0.76 01/16/2018   BUN 11 01/16/2018   CO2 30 01/16/2018   TSH 1.96 04/12/2017   HGBA1C 5.9 01/16/2018    Mm Digital Screening Bilateral  Result Date: 05/06/2017 CLINICAL DATA:  Screening. EXAM: DIGITAL SCREENING BILATERAL MAMMOGRAM WITH CAD COMPARISON:  Previous exam(s). ACR Breast Density Category b: There are scattered areas of fibroglandular density. FINDINGS: There are no findings suspicious for malignancy. Images were processed with CAD. IMPRESSION: No mammographic evidence of malignancy. A result letter of this screening mammogram will be mailed directly to the patient. RECOMMENDATION: Screening mammogram in one year. (Code:SM-B-01Y) BI-RADS CATEGORY  1: Negative. Electronically Signed   By: Ammie Ferrier M.D.   On: 05/06/2017 15:11       Assessment & Plan:   Problem List Items Addressed This Visit    Abnormal liver function tests    Previous ultrasound revealed fatty liver.  Discussed diet and exercise.  Follow liver panel.  Discussed f/u ultrasound in future.        Relevant Orders   Hepatic function panel   BMI 33.0-33.9,adult    Discussed diet and exercise.  Follow.         Health care maintenance    Physical today 01/19/18.  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  Mammogram 05/06/17 - Birads I.        History of non anemic vitamin B12 deficiency    Follow cbc.        Relevant Orders   Vitamin B12   Hypercholesterolemia    Low cholesterol diet and exercise.  Follow lipid panel.        Relevant Orders   CBC with Differential/Platelet   Lipid panel   Hyperglycemia    Low carb diet and exercise.  Follow met b and a1c.        Relevant Orders   Hemoglobin D4Y   Basic metabolic panel   Hypothyroid    On thyroid replacement.  Follow tsh.        Relevant Orders   TSH   Stress    Increased stress.  Discussed with her today.  On zoloft.  Will continue.  Follow.        Toe pain, left    Toe nail - ingrown.  Minimal erythema.  Minimal tenderness to palpation. bactroban as directed.  Refer to podiatry given persistent problem.        Relevant Orders   Ambulatory referral to Podiatry   Vitamin D deficiency    Follow vitamin D level.       Relevant Orders   VITAMIN D 25 Hydroxy (Vit-D Deficiency, Fractures)    Other Visit Diagnoses    Cervical cancer screening    -  Primary   Relevant Orders   Cytology - PAP   Sinusitis, unspecified chronicity, unspecified location       Symptoms c/w sinus infection.  Treat with saline nasal spray, nasacort and robitussin as directed.  if no improvement, abx.  probiotics.  follow.     Relevant Medications   amoxicillin (AMOXIL) 875 MG tablet   Routine general medical examination at a health care facility  Einar Pheasant, MD

## 2018-01-19 NOTE — Assessment & Plan Note (Signed)
Physical today 01/19/18.  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  Mammogram 05/06/17 - Birads I.

## 2018-01-22 ENCOUNTER — Encounter: Payer: Self-pay | Admitting: Internal Medicine

## 2018-01-22 DIAGNOSIS — M79675 Pain in left toe(s): Secondary | ICD-10-CM | POA: Insufficient documentation

## 2018-01-22 NOTE — Assessment & Plan Note (Signed)
Previous ultrasound revealed fatty liver.  Discussed diet and exercise.  Follow liver panel.  Discussed f/u ultrasound in future.

## 2018-01-22 NOTE — Assessment & Plan Note (Signed)
Discussed diet and exercise.  Follow.  

## 2018-01-22 NOTE — Assessment & Plan Note (Signed)
Toe nail - ingrown.  Minimal erythema.  Minimal tenderness to palpation. bactroban as directed.  Refer to podiatry given persistent problem.

## 2018-01-22 NOTE — Assessment & Plan Note (Signed)
Low cholesterol diet and exercise.  Follow lipid panel.   

## 2018-01-22 NOTE — Assessment & Plan Note (Signed)
Follow vitamin D level.  

## 2018-01-22 NOTE — Assessment & Plan Note (Signed)
Follow cbc.  

## 2018-01-22 NOTE — Assessment & Plan Note (Signed)
On thyroid replacement.  Follow tsh.  

## 2018-01-22 NOTE — Assessment & Plan Note (Signed)
Low carb diet and exercise.  Follow met b and a1c.   

## 2018-01-22 NOTE — Assessment & Plan Note (Signed)
Increased stress.  Discussed with her today.  On zoloft.  Will continue.  Follow.

## 2018-01-24 LAB — CYTOLOGY - PAP
Diagnosis: NEGATIVE
HPV: NOT DETECTED

## 2018-01-25 ENCOUNTER — Encounter: Payer: Self-pay | Admitting: Internal Medicine

## 2018-01-25 ENCOUNTER — Other Ambulatory Visit: Payer: Self-pay | Admitting: Internal Medicine

## 2018-02-28 ENCOUNTER — Other Ambulatory Visit: Payer: Self-pay | Admitting: Internal Medicine

## 2018-03-20 ENCOUNTER — Other Ambulatory Visit: Payer: Self-pay | Admitting: Internal Medicine

## 2018-04-17 ENCOUNTER — Encounter: Payer: Self-pay | Admitting: Internal Medicine

## 2018-04-17 ENCOUNTER — Other Ambulatory Visit: Payer: Self-pay

## 2018-04-17 MED ORDER — SERTRALINE HCL 25 MG PO TABS: ORAL_TABLET | ORAL | 1 refills | Status: DC

## 2018-04-24 ENCOUNTER — Other Ambulatory Visit: Payer: Self-pay | Admitting: Internal Medicine

## 2018-05-12 ENCOUNTER — Other Ambulatory Visit: Payer: Self-pay | Admitting: Internal Medicine

## 2018-05-12 ENCOUNTER — Encounter: Payer: Self-pay | Admitting: Internal Medicine

## 2018-05-17 ENCOUNTER — Other Ambulatory Visit (INDEPENDENT_AMBULATORY_CARE_PROVIDER_SITE_OTHER): Payer: BLUE CROSS/BLUE SHIELD

## 2018-05-17 DIAGNOSIS — E559 Vitamin D deficiency, unspecified: Secondary | ICD-10-CM

## 2018-05-17 DIAGNOSIS — Z8639 Personal history of other endocrine, nutritional and metabolic disease: Secondary | ICD-10-CM

## 2018-05-17 DIAGNOSIS — R739 Hyperglycemia, unspecified: Secondary | ICD-10-CM

## 2018-05-17 DIAGNOSIS — R945 Abnormal results of liver function studies: Secondary | ICD-10-CM | POA: Diagnosis not present

## 2018-05-17 DIAGNOSIS — E7800 Pure hypercholesterolemia, unspecified: Secondary | ICD-10-CM | POA: Diagnosis not present

## 2018-05-17 DIAGNOSIS — E039 Hypothyroidism, unspecified: Secondary | ICD-10-CM | POA: Diagnosis not present

## 2018-05-17 DIAGNOSIS — R7989 Other specified abnormal findings of blood chemistry: Secondary | ICD-10-CM

## 2018-05-17 LAB — HEPATIC FUNCTION PANEL
ALBUMIN: 4.4 g/dL (ref 3.5–5.2)
ALT: 31 U/L (ref 0–35)
AST: 28 U/L (ref 0–37)
Alkaline Phosphatase: 66 U/L (ref 39–117)
BILIRUBIN TOTAL: 0.5 mg/dL (ref 0.2–1.2)
Bilirubin, Direct: 0.1 mg/dL (ref 0.0–0.3)
Total Protein: 7.3 g/dL (ref 6.0–8.3)

## 2018-05-17 LAB — CBC WITH DIFFERENTIAL/PLATELET
BASOS PCT: 0.7 % (ref 0.0–3.0)
Basophils Absolute: 0 10*3/uL (ref 0.0–0.1)
EOS PCT: 2.2 % (ref 0.0–5.0)
Eosinophils Absolute: 0.1 10*3/uL (ref 0.0–0.7)
HEMATOCRIT: 45.6 % (ref 36.0–46.0)
HEMOGLOBIN: 15.6 g/dL — AB (ref 12.0–15.0)
Lymphocytes Relative: 27.8 % (ref 12.0–46.0)
Lymphs Abs: 1.8 10*3/uL (ref 0.7–4.0)
MCHC: 34.1 g/dL (ref 30.0–36.0)
MCV: 94.5 fl (ref 78.0–100.0)
MONO ABS: 0.5 10*3/uL (ref 0.1–1.0)
Monocytes Relative: 7.9 % (ref 3.0–12.0)
NEUTROS ABS: 3.9 10*3/uL (ref 1.4–7.7)
Neutrophils Relative %: 61.4 % (ref 43.0–77.0)
PLATELETS: 212 10*3/uL (ref 150.0–400.0)
RBC: 4.82 Mil/uL (ref 3.87–5.11)
RDW: 13.5 % (ref 11.5–15.5)
WBC: 6.4 10*3/uL (ref 4.0–10.5)

## 2018-05-17 LAB — BASIC METABOLIC PANEL
BUN: 12 mg/dL (ref 6–23)
CALCIUM: 9.3 mg/dL (ref 8.4–10.5)
CO2: 30 meq/L (ref 19–32)
CREATININE: 0.67 mg/dL (ref 0.40–1.20)
Chloride: 99 mEq/L (ref 96–112)
GFR: 96.02 mL/min (ref >60.00)
Glucose, Bld: 112 mg/dL — ABNORMAL HIGH (ref 70–99)
Potassium: 3.9 mEq/L (ref 3.5–5.1)
Sodium: 137 mEq/L (ref 135–145)

## 2018-05-17 LAB — LDL CHOLESTEROL, DIRECT: Direct LDL: 146 mg/dL

## 2018-05-17 LAB — LIPID PANEL
CHOLESTEROL: 218 mg/dL — AB (ref 0–200)
HDL: 50.6 mg/dL (ref >39.00)
NonHDL: 167.02
TRIGLYCERIDES: 241 mg/dL — AB (ref 0.0–149.0)
Total CHOL/HDL Ratio: 4
VLDL: 48.2 mg/dL — ABNORMAL HIGH (ref 0.0–40.0)

## 2018-05-17 LAB — HEMOGLOBIN A1C: HEMOGLOBIN A1C: 5.9 % (ref 4.6–6.5)

## 2018-05-17 LAB — VITAMIN B12: VITAMIN B 12: 893 pg/mL (ref 211–911)

## 2018-05-17 LAB — VITAMIN D 25 HYDROXY (VIT D DEFICIENCY, FRACTURES): VITD: 25.05 ng/mL — AB (ref 30.00–100.00)

## 2018-05-17 LAB — TSH: TSH: 2.85 u[IU]/mL (ref 0.35–4.50)

## 2018-05-18 ENCOUNTER — Encounter: Payer: Self-pay | Admitting: Internal Medicine

## 2018-05-22 ENCOUNTER — Ambulatory Visit: Payer: BLUE CROSS/BLUE SHIELD | Admitting: Internal Medicine

## 2018-05-22 ENCOUNTER — Encounter: Payer: Self-pay | Admitting: Internal Medicine

## 2018-05-22 VITALS — BP 110/70 | HR 74 | Temp 98.0°F | Resp 18 | Wt 205.4 lb

## 2018-05-22 DIAGNOSIS — R7989 Other specified abnormal findings of blood chemistry: Secondary | ICD-10-CM

## 2018-05-22 DIAGNOSIS — R300 Dysuria: Secondary | ICD-10-CM | POA: Diagnosis not present

## 2018-05-22 DIAGNOSIS — R945 Abnormal results of liver function studies: Secondary | ICD-10-CM

## 2018-05-22 DIAGNOSIS — F439 Reaction to severe stress, unspecified: Secondary | ICD-10-CM

## 2018-05-22 DIAGNOSIS — Z1231 Encounter for screening mammogram for malignant neoplasm of breast: Secondary | ICD-10-CM | POA: Diagnosis not present

## 2018-05-22 DIAGNOSIS — R739 Hyperglycemia, unspecified: Secondary | ICD-10-CM

## 2018-05-22 DIAGNOSIS — E7800 Pure hypercholesterolemia, unspecified: Secondary | ICD-10-CM

## 2018-05-22 DIAGNOSIS — Z6833 Body mass index (BMI) 33.0-33.9, adult: Secondary | ICD-10-CM

## 2018-05-22 DIAGNOSIS — E559 Vitamin D deficiency, unspecified: Secondary | ICD-10-CM

## 2018-05-22 DIAGNOSIS — E039 Hypothyroidism, unspecified: Secondary | ICD-10-CM

## 2018-05-22 LAB — URINALYSIS, ROUTINE W REFLEX MICROSCOPIC
Bilirubin Urine: NEGATIVE
Ketones, ur: NEGATIVE
Leukocytes, UA: NEGATIVE
Nitrite: NEGATIVE
PH: 5 (ref 5.0–8.0)
Total Protein, Urine: NEGATIVE
UROBILINOGEN UA: 0.2 (ref 0.0–1.0)
Urine Glucose: NEGATIVE

## 2018-05-22 MED ORDER — THYROID 90 MG PO TABS: 90.0000 mg | ORAL_TABLET | Freq: Every day | ORAL | 1 refills | Status: DC

## 2018-05-22 MED ORDER — SERTRALINE HCL 50 MG PO TABS: 50.0000 mg | ORAL_TABLET | Freq: Every day | ORAL | 3 refills | Status: DC

## 2018-05-22 NOTE — Progress Notes (Signed)
Patient ID: Carol Jimenez, female   DOB: 1960/08/31, 57 y.o.   MRN: [id number]   Subjective:    Patient ID: Carol Jimenez, female    DOB: 04/04/1961, 57 y.o.   MRN: [id number]  HPI  Patient here for a scheduled follow up.  She reports that the zoloft helps to level things out.  Concern about some pm fatigue.  Was questioning if the zoloft was contributing.  We discussed treatment options.  Work is very stressful.  Also has family stressors.  Discussed seeing a counselor.  She declines.  Does not feel this is needed.  Did discuss increasing zoloft and seeing if helped with some of the daytime feelings - since she does feel the zoloft helps.  She received generic armour thyroid.  Feels is not tolerating.  Request name brand.  State she feels this contributed to her fatigue and to her hair falling out.  Discussed recent tsh wnl.  No chest pain.  Breathing stable.  No acid reflux.  Reports increased dysuria and urgency.  Some pressure.  Concern may have uti. Wants to be checked.  No vaginal symptoms.     Past Medical History:  Diagnosis Date  . Allergy   . Hx: UTI (urinary tract infection)   . Hypothyroidism    Past Surgical History:  Procedure Laterality Date  . BREAST BIOPSY Left 2015   negative, stereotactic biopsy  . CESAREAN SECTION    . ENDOMETRIAL ABLATION  2000   Family History  Problem Relation Age of Onset  . Congestive Heart Failure Mother   . Diabetes Mother   . COPD Mother   . Hypertension Father   . Esophageal cancer Father 39       [facility] at [facility]  . Colon polyps Unknown   . Breast cancer Neg Hx    Social History   Socioeconomic History  . Marital status: Married    Spouse name: Not on file  . Number of children: Not on file  . Years of education: Not on file  . Highest education level: Not on file  Occupational History  . Not on file  Social Needs  . Financial resource strain: Not on file  . Food insecurity:    Worry: Not on file    Inability: Not on  file  . Transportation needs:    Medical: Not on file    Non-medical: Not on file  Tobacco Use  . Smoking status: Never Smoker  . Smokeless tobacco: Never Used  Substance and Sexual Activity  . Alcohol use: Yes    Alcohol/week: 0.0 standard drinks    Comment: rare  . Drug use: No  . Sexual activity: Not on file  Lifestyle  . Physical activity:    Days per week: Not on file    Minutes per session: Not on file  . Stress: Not on file  Relationships  . Social connections:    Talks on phone: Not on file    Gets together: Not on file    Attends religious service: Not on file    Active member of club or organization: Not on file    Attends meetings of clubs or organizations: Not on file    Relationship status: Not on file  Other Topics Concern  . Not on file  Social History Narrative  . Not on file    Outpatient Encounter Medications as of 05/22/2018  Medication Sig  . estradiol (ESTRACE) 0.5 MG tablet take 1 to 2 tablets by  mouth once daily  . hydrochlorothiazide (HYDRODIURIL) 25 MG tablet TAKE 1 TABLET(25 MG) BY MOUTH DAILY  . meloxicam (MOBIC) 15 MG tablet Take 15 mg by mouth daily.  . mupirocin ointment (BACTROBAN) 2 % Apply to affected area on toe bid  . pantoprazole (PROTONIX) 40 MG tablet Take 1 tablet (40 mg total) by mouth daily. Take 30 minutes before breakfast  . progesterone (PROMETRIUM) 100 MG capsule TAKE 1 CAPSULE(100 MG) BY MOUTH DAILY  . sertraline (ZOLOFT) 50 MG tablet Take 1 tablet (50 mg total) by mouth daily.  Marland Kitchen thyroid (ARMOUR THYROID) 90 MG tablet Take 1 tablet (90 mg total) by mouth daily before breakfast. Name brand only  . [DISCONTINUED] amoxicillin (AMOXIL) 875 MG tablet Take 1 tablet (875 mg total) by mouth 2 (two) times daily.  . [DISCONTINUED] NP THYROID 90 MG tablet TAKE 1 TABLET(90 MG) BY MOUTH DAILY  . [DISCONTINUED] sertraline (ZOLOFT) 25 MG tablet Take 1 tablet by mouth daily  . [DISCONTINUED] thyroid (ARMOUR THYROID) 90 MG tablet Take 90 mg by  mouth daily before breakfast.   No facility-administered encounter medications on file as of 05/22/2018.     Review of Systems  Constitutional: Positive for fatigue. Negative for appetite change and unexpected weight change.  HENT: Negative for congestion and sinus pressure.   Respiratory: Negative for cough, chest tightness and shortness of breath.   Cardiovascular: Negative for chest pain, palpitations and leg swelling.  Gastrointestinal: Negative for abdominal pain, diarrhea, nausea and vomiting.  Genitourinary: Positive for dysuria and urgency. Negative for vaginal discharge.  Musculoskeletal: Negative for joint swelling and myalgias.  Skin: Negative for color change and rash.  Neurological: Negative for dizziness, light-headedness and headaches.  Psychiatric/Behavioral: Negative for agitation and dysphoric mood.       Objective:     Blood pressure rechecked by me:  118/2  Physical Exam  Constitutional: She appears well-developed and well-nourished. No distress.  HENT:  Nose: Nose normal.  Mouth/Throat: Oropharynx is clear and moist.  Neck: Neck supple. No thyromegaly present.  Cardiovascular: Normal rate and regular rhythm.  Pulmonary/Chest: Breath sounds normal. No respiratory distress. She has no wheezes.  Abdominal: Soft. Bowel sounds are normal. There is no tenderness.  Musculoskeletal: She exhibits no edema or tenderness.  Lymphadenopathy:    She has no cervical adenopathy.  Skin: No rash noted. No erythema.  Psychiatric: She has a normal mood and affect. Her behavior is normal.    BP 110/70 (BP Location: Left Arm, Patient Position: Sitting, Cuff Size: Normal)   Pulse 74   Temp 98 F (36.7 C) (Oral)   Resp 18   Wt 205 lb 6.4 oz (93.2 kg)   LMP 12/26/2012   SpO2 97%   BMI 33.15 kg/m  Wt Readings from Last 3 Encounters:  05/22/18 205 lb 6.4 oz (93.2 kg)  01/19/18 205 lb 12.8 oz (93.4 kg)  07/04/17 200 lb 6.4 oz (90.9 kg)     Lab Results  Component  Value Date   WBC 6.4 05/17/2018   HGB 15.6 (H) 05/17/2018   HCT 45.6 05/17/2018   PLT 212.0 05/17/2018   GLUCOSE 112 (H) 05/17/2018   CHOL 218 (H) 05/17/2018   TRIG 241.0 (H) 05/17/2018   HDL 50.60 05/17/2018   LDLDIRECT 146.0 05/17/2018   LDLCALC 108 (H) 01/16/2018   ALT 31 05/17/2018   AST 28 05/17/2018   NA 137 05/17/2018   K 3.9 05/17/2018   CL 99 05/17/2018   CREATININE 0.67 05/17/2018   BUN  12 05/17/2018   CO2 30 05/17/2018   TSH 2.85 05/17/2018   HGBA1C 5.9 05/17/2018       Assessment & Plan:   Problem List Items Addressed This Visit    Abnormal liver function tests    Previous ultrasound revealed fatty liver.  Discussed diet and exercise and weight loss.  Follow liver function tests.        BMI 33.0-33.9,adult    Discussed diet and exercise.        Hypercholesterolemia    Low cholesterol diet and exercise.  Follow lipid panel.        Hyperglycemia    Low carb diet and exercise.  Follow met b and a1c.        Hypothyroid    rx written for name brand only armour thyroid.  Recent tsh wnl.  Follow.        Relevant Medications   thyroid (ARMOUR THYROID) 90 MG tablet   Stress    Increased stress as outlined.  Will increase zoloft to 31m q day.  Follow.        Vitamin D deficiency    Follow vitamin D level.         Other Visit Diagnoses    Visit for screening mammogram    -  Primary   Relevant Orders   MM 3D SCREEN BREAST BILATERAL   Dysuria       Check urinalysis and culture to confirm no infection.     Relevant Orders   Urinalysis, Routine w reflex microscopic (Completed)   Urine Culture (Completed)       CEinar Pheasant MD

## 2018-05-23 ENCOUNTER — Encounter: Payer: Self-pay | Admitting: Internal Medicine

## 2018-05-23 LAB — URINE CULTURE
MICRO NUMBER:: 91418486
RESULT: NO GROWTH
SPECIMEN QUALITY:: ADEQUATE

## 2018-05-24 ENCOUNTER — Encounter: Payer: Self-pay | Admitting: Internal Medicine

## 2018-05-28 ENCOUNTER — Encounter: Payer: Self-pay | Admitting: Internal Medicine

## 2018-05-28 NOTE — Assessment & Plan Note (Signed)
Follow vitamin D level.  

## 2018-05-28 NOTE — Assessment & Plan Note (Signed)
Increased stress as outlined.  Will increase zoloft to 50mg  q day.  Follow.

## 2018-05-28 NOTE — Assessment & Plan Note (Signed)
Discussed diet and exercise 

## 2018-05-28 NOTE — Assessment & Plan Note (Signed)
Previous ultrasound revealed fatty liver.  Discussed diet and exercise and weight loss.  Follow liver function tests.

## 2018-05-28 NOTE — Assessment & Plan Note (Signed)
Low cholesterol diet and exercise.  Follow lipid panel.   

## 2018-05-28 NOTE — Assessment & Plan Note (Signed)
rx written for name brand only armour thyroid.  Recent tsh wnl.  Follow.

## 2018-05-28 NOTE — Assessment & Plan Note (Signed)
Low carb diet and exercise.  Follow met b and a1c.

## 2018-06-06 ENCOUNTER — Encounter: Payer: Self-pay | Admitting: Internal Medicine

## 2018-06-06 MED ORDER — SCOPOLAMINE 1 MG/3DAYS TD PT72: 1.0000 | MEDICATED_PATCH | TRANSDERMAL | 0 refills | Status: DC

## 2018-06-06 NOTE — Telephone Encounter (Signed)
Pt is going on a cruise in Feb. Ok to send in scopolamine patches?

## 2018-06-06 NOTE — Telephone Encounter (Signed)
rx sent in for scopolamine patches.  

## 2018-06-14 ENCOUNTER — Ambulatory Visit
Admission: RE | Admit: 2018-06-14 | Discharge: 2018-06-14 | Disposition: A | Discharge status: Home / Self Care | Payer: BLUE CROSS/BLUE SHIELD | Source: Ambulatory Visit | Attending: Internal Medicine | Admitting: Internal Medicine

## 2018-06-14 DIAGNOSIS — Z1231 Encounter for screening mammogram for malignant neoplasm of breast: Secondary | ICD-10-CM | POA: Diagnosis not present

## 2018-06-15 ENCOUNTER — Encounter: Payer: Self-pay | Admitting: Internal Medicine

## 2018-06-15 ENCOUNTER — Other Ambulatory Visit: Payer: Self-pay

## 2018-06-15 ENCOUNTER — Other Ambulatory Visit: Payer: Self-pay | Admitting: Internal Medicine

## 2018-06-15 MED ORDER — ESTRADIOL 0.5 MG PO TABS: ORAL_TABLET | ORAL | 2 refills | Status: DC

## 2018-06-15 NOTE — Telephone Encounter (Unsigned)
Copied from CRM [phone]#[id number]. Topic: Quick Communication - Rx Refill/Question >> Jun 15, 2018 10:53 AM Marylen PontoMcneil, Ja-Kwan wrote: Medication: estradiol (ESTRACE) 0.5 MG tablet  Has the patient contacted their pharmacy? yes   Preferred Pharmacy (with phone number or street name): [id number] [place] - [address] [facility] [address] [phone] (Phone) [phone] (Fax)  Agent: Please be advised that RX refills may take up to 3 business days. We ask that you follow-up with your pharmacy.

## 2018-06-15 NOTE — Telephone Encounter (Signed)
Pharmacy aware of 3 business day turn around time for med refills but requesting to speak the provider. Pharmacist name is Vincenza HewsShane.

## 2018-07-05 ENCOUNTER — Encounter: Payer: Self-pay | Admitting: Internal Medicine

## 2018-07-05 NOTE — Telephone Encounter (Signed)
Called patient to let her know that she should be evaluated instead of being treated through My Chart. She has only ran a slight fever around 100 x1 day. Scheduled patient for appt with Lauren tomorrow. Also advised patient that she should be evaluated sooner if symptoms worsen prior to appt. Pt agreed.

## 2018-07-06 ENCOUNTER — Ambulatory Visit (INDEPENDENT_AMBULATORY_CARE_PROVIDER_SITE_OTHER): Payer: BLUE CROSS/BLUE SHIELD | Admitting: Family Medicine

## 2018-07-06 ENCOUNTER — Encounter: Payer: Self-pay | Admitting: Family Medicine

## 2018-07-06 VITALS — BP 118/78 | HR 105 | Temp 99.2°F | Resp 16 | Ht 66.0 in | Wt 200.6 lb

## 2018-07-06 DIAGNOSIS — J0110 Acute frontal sinusitis, unspecified: Secondary | ICD-10-CM | POA: Diagnosis not present

## 2018-07-06 DIAGNOSIS — R05 Cough: Secondary | ICD-10-CM

## 2018-07-06 DIAGNOSIS — R52 Pain, unspecified: Secondary | ICD-10-CM | POA: Diagnosis not present

## 2018-07-06 DIAGNOSIS — R0981 Nasal congestion: Secondary | ICD-10-CM

## 2018-07-06 DIAGNOSIS — R059 Cough, unspecified: Secondary | ICD-10-CM

## 2018-07-06 LAB — POC INFLUENZA A&B (BINAX/QUICKVUE)
INFLUENZA B, POC: NEGATIVE
Influenza A, POC: NEGATIVE

## 2018-07-06 MED ORDER — AMOXICILLIN-POT CLAVULANATE 875-125 MG PO TABS: 1.0000 | ORAL_TABLET | Freq: Two times a day (BID) | ORAL | 0 refills | Status: DC

## 2018-07-06 NOTE — Patient Instructions (Signed)
Keep taking Claritin, using nasal spray for congestion  Also can continue to use delsym as needed for cough   Sinusitis, Adult Sinusitis is soreness and swelling (inflammation) of your sinuses. Sinuses are hollow spaces in the bones around your face. They are located:  Around your eyes.  In the middle of your forehead.  Behind your nose.  In your cheekbones. Your sinuses and nasal passages are lined with a fluid called mucus. Mucus drains out of your sinuses. Swelling can trap mucus in your sinuses. This lets germs (bacteria, virus, or fungus) grow, which leads to infection. Most of the time, this condition is caused by a virus. What are the causes? This condition is caused by:  Allergies.  Asthma.  Germs.  Things that block your nose or sinuses.  Growths in the nose (nasal polyps).  Chemicals or irritants in the air.  Fungus (rare). What increases the risk? You are more likely to develop this condition if:  You have a weak body defense system (immune system).  You do a lot of swimming or diving.  You use nasal sprays too much.  You smoke. What are the signs or symptoms? The main symptoms of this condition are pain and a feeling of pressure around the sinuses. Other symptoms include:  Stuffy nose (congestion).  Runny nose (drainage).  Swelling and warmth in the sinuses.  Headache.  Toothache.  A cough that may get worse at night.  Mucus that collects in the throat or the back of the nose (postnasal drip).  Being unable to smell and taste.  Being very tired (fatigue).  A fever.  Sore throat.  Bad breath. How is this diagnosed? This condition is diagnosed based on:  Your symptoms.  Your medical history.  A physical exam.  Tests to find out if your condition is short-term (acute) or long-term (chronic). Your doctor may: ? Check your nose for growths (polyps). ? Check your sinuses using a tool that has a light (endoscope). ? Check for  allergies or germs. ? Do imaging tests, such as an MRI or CT scan. How is this treated? Treatment for this condition depends on the cause and whether it is short-term or long-term.  If caused by a virus, your symptoms should go away on their own within 10 days. You may be given medicines to relieve symptoms. They include: ? Medicines that shrink swollen tissue in the nose. ? Medicines that treat allergies (antihistamines). ? A spray that treats swelling of the nostrils. ? Rinses that help get rid of thick mucus in your nose (nasal saline washes).  If caused by bacteria, your doctor may wait to see if you will get better without treatment. You may be given antibiotic medicine if you have: ? A very bad infection. ? A weak body defense system.  If caused by growths in the nose, you may need to have surgery. Follow these instructions at home: Medicines  Take, use, or apply over-the-counter and prescription medicines only as told by your doctor. These may include nasal sprays.  If you were prescribed an antibiotic medicine, take it as told by your doctor. Do not stop taking the antibiotic even if you start to feel better. Hydrate and humidify   Drink enough water to keep your pee (urine) pale yellow.  Use a cool mist humidifier to keep the humidity level in your home above 50%.  Breathe in steam for 10-15 minutes, 3-4 times a day, or as told by your doctor. You can do  this in the bathroom while a hot shower is running.  Try not to spend time in cool or dry air. Rest  Rest as much as you can.  Sleep with your head raised (elevated).  Make sure you get enough sleep each night. General instructions   Put a warm, moist washcloth on your face 3-4 times a day, or as often as told by your doctor. This will help with discomfort.  Wash your hands often with soap and water. If there is no soap and water, use hand sanitizer.  Do not smoke. Avoid being around people who are smoking  (secondhand smoke).  Keep all follow-up visits as told by your doctor. This is important. Contact a doctor if:  You have a fever.  Your symptoms get worse.  Your symptoms do not get better within 10 days. Get help right away if:  You have a very bad headache.  You cannot stop throwing up (vomiting).  You have very bad pain or swelling around your face or eyes.  You have trouble seeing.  You feel confused.  Your neck is stiff.  You have trouble breathing. Summary  Sinusitis is swelling of your sinuses. Sinuses are hollow spaces in the bones around your face.  This condition is caused by tissues in your nose that become inflamed or swollen. This traps germs. These can lead to infection.  If you were prescribed an antibiotic medicine, take it as told by your doctor. Do not stop taking it even if you start to feel better.  Keep all follow-up visits as told by your doctor. This is important. This information is not intended to replace advice given to you by your health care provider. Make sure you discuss any questions you have with your health care provider. Document Released: 12/01/2007 Document Revised: 11/14/2017 Document Reviewed: 11/14/2017 Elsevier Interactive Patient Education  2019 [employer].

## 2018-07-06 NOTE — Progress Notes (Signed)
Subjective:    Patient ID: Carol Jimenez, female    DOB: 08/11/1960, 58 y.o.   MRN: [id number]  HPI   Patient presents to clinic complaining of cough, sneezing, nasal congestion/sinus pressure, thick green nasal discharge and a sinus headache for the past 2 weeks.  Approximately 2 days ago she began to have some body aches, so she wants to be sure she is not positive for the flu.  Patient has been taking Claritin-D, Delsym and Advil with minimal effect in helping symptoms.  Denies fever or chills.  Denies shortness of breath or wheezing.  Denies chest pain.  Denies nausea/vomiting/diarrhea.  Patient Active Problem List   Diagnosis Date Noted  . Toe pain, left 01/22/2018  . Hyperglycemia 06/13/2016  . Right knee pain 08/17/2015  . Stress 03/23/2015  . Health care maintenance 12/24/2014  . History of non anemic vitamin B12 deficiency 12/23/2014  . Vitamin D deficiency 12/23/2014  . Rib pain on right side 12/23/2014  . Pseudoangiomatous stromal hyperplasia of breast 08/12/2014  . BMI 33.0-33.9,adult 04/13/2014  . Abnormal liver function tests 04/13/2014  . Hypothyroid 11/12/2013  . Menopausal symptoms 11/12/2013  . Side pain 11/12/2013  . Fatigue 11/12/2013  . Daytime somnolence 11/12/2013  . Environmental allergies 11/12/2013  . Hypercholesterolemia 11/12/2013   Social History   Tobacco Use  . Smoking status: Never Smoker  . Smokeless tobacco: Never Used  Substance Use Topics  . Alcohol use: Yes    Alcohol/week: 0.0 standard drinks    Comment: rare   Review of Systems  Constitutional: No fever or chills.  HENT: +congestion, ear pain, sinus pain Eyes: Negative.   Respiratory: +cough. Negative for shortness of breath and wheezing.   Cardiovascular: Negative for chest pain, palpitations and leg swelling.  Gastrointestinal: Negative for abdominal pain, diarrhea, nausea and vomiting.  Genitourinary: Negative for dysuria, frequency and urgency.  Musculoskeletal: body  aches Skin: Negative for color change, pallor and rash.  Neurological: Negative for syncope, light-headedness and headaches.  Psychiatric/Behavioral: The patient is not nervous/anxious.       Objective:   Physical Exam  Constitutional: She appears well-developed and well-nourished. No distress.  HENT:  Head: Normocephalic and atraumatic.  Nose/throat: Positive facial and maxillary sinus tenderness.  Positive thick nasal drainage.  Positive postnasal drip. Eyes: Pupils are equal, round, and reactive to light. EOM are normal. No scleral icterus.  Neck: Normal range of motion. Neck supple. No tracheal deviation present.  Cardiovascular: Normal rate, regular rhythm and normal heart sounds.  Pulmonary/Chest: Effort normal and breath sounds normal. No respiratory distress. She has no wheezes. She has no rales.   Neurological: She is alert and oriented to person, place, and time.  Gait normal  Skin: Skin is warm and dry. No pallor.  Psychiatric: She has a normal mood and affect. Her behavior is normal.   Nursing note and vitals reviewed.  Vitals:   07/06/18 1408  BP: 118/78  Pulse: (!) 105  Resp: 16  Temp: 99.2 F (37.3 C)  SpO2: 96%      Assessment & Plan:   Acute sinusitis, nasal congestion, cough, body aches- point-of-care influenza is negative in clinic.  Suspect body aches are related to sinus infection.  Patient will take course of Augmentin twice daily for 10 days.  Advised to continue using Claritin-D to help improve congestion, also chest that she can use a saline nasal spray/saline nasal rinse to help congestion as well.  I suspect cough is related to postnasal drip,  advised she can trial over-the-counter Mucinex as this is a good medication for cough and to help thin secretions.  Patient advised to rest, increase fluid intake, do good handwashing, alternate Tylenol and Motrin as needed for any aches.  Patient will keep regularly scheduled follow-up with PCP as planned.  She  will return to clinic sooner if any issues arise.

## 2018-07-24 ENCOUNTER — Ambulatory Visit: Payer: BLUE CROSS/BLUE SHIELD | Admitting: Internal Medicine

## 2018-08-01 ENCOUNTER — Encounter: Payer: Self-pay | Admitting: Internal Medicine

## 2018-08-10 ENCOUNTER — Other Ambulatory Visit: Payer: Self-pay | Admitting: Internal Medicine

## 2018-08-14 ENCOUNTER — Ambulatory Visit: Payer: BLUE CROSS/BLUE SHIELD | Admitting: Internal Medicine

## 2018-08-25 ENCOUNTER — Ambulatory Visit: Payer: BLUE CROSS/BLUE SHIELD | Admitting: Internal Medicine

## 2018-09-15 ENCOUNTER — Telehealth: Payer: BLUE CROSS/BLUE SHIELD | Admitting: Family

## 2018-09-15 DIAGNOSIS — N390 Urinary tract infection, site not specified: Secondary | ICD-10-CM

## 2018-09-15 MED ORDER — CEPHALEXIN 500 MG PO CAPS: 500.0000 mg | ORAL_CAPSULE | Freq: Two times a day (BID) | ORAL | 0 refills | Status: DC

## 2018-09-15 NOTE — Progress Notes (Signed)
Greater than 5 minutes, yet less than 10 minutes of time have been spent researching, coordinating, and implementing care for this patient today.  Thank you for the details you included in the comment boxes. Those details are very helpful in determining the best course of treatment for you and help us to provide the best care.  We are sorry that you are not feeling well.  Here is how we plan to help!  Based on what you shared with me it looks like you most likely have a simple urinary tract infection.  A UTI (Urinary Tract Infection) is a bacterial infection of the bladder.  Most cases of urinary tract infections are simple to treat but a key part of your care is to encourage you to drink plenty of fluids and watch your symptoms carefully.  I have prescribed Keflex 500 mg twice a day for 7 days.  Your symptoms should gradually improve. Call us if the burning in your urine worsens, you develop worsening fever, back pain or pelvic pain or if your symptoms do not resolve after completing the antibiotic.  Urinary tract infections can be prevented by drinking plenty of water to keep your body hydrated.  Also be sure when you wipe, wipe from front to back and don't hold it in!  If possible, empty your bladder every 4 hours.  Your e-visit answers were reviewed by a board certified advanced clinical practitioner to complete your personal care plan.  Depending on the condition, your plan could have included both over the counter or prescription medications.  If there is a problem please reply  once you have received a response from your provider.  Your safety is important to us.  If you have drug allergies check your prescription carefully.    You can use MyChart to ask questions about today's visit, request a non-urgent call back, or ask for a work or school excuse for 24 hours related to this e-Visit. If it has been greater than 24 hours you will need to follow up with your provider, or enter a new  e-Visit to address those concerns.   You will get an e-mail in the next two days asking about your experience.  I hope that your e-visit has been valuable and will speed your recovery. Thank you for using e-visits.    

## 2018-10-09 ENCOUNTER — Other Ambulatory Visit: Payer: Self-pay | Admitting: Internal Medicine

## 2018-10-31 ENCOUNTER — Other Ambulatory Visit: Payer: Self-pay | Admitting: Internal Medicine

## 2018-11-09 ENCOUNTER — Encounter: Payer: Self-pay | Admitting: Internal Medicine

## 2018-11-13 ENCOUNTER — Ambulatory Visit (INDEPENDENT_AMBULATORY_CARE_PROVIDER_SITE_OTHER): Payer: BLUE CROSS/BLUE SHIELD | Admitting: Internal Medicine

## 2018-11-13 ENCOUNTER — Encounter: Payer: Self-pay | Admitting: Internal Medicine

## 2018-11-13 ENCOUNTER — Other Ambulatory Visit: Payer: Self-pay

## 2018-11-13 DIAGNOSIS — E039 Hypothyroidism, unspecified: Secondary | ICD-10-CM

## 2018-11-13 DIAGNOSIS — R7989 Other specified abnormal findings of blood chemistry: Secondary | ICD-10-CM

## 2018-11-13 DIAGNOSIS — E7800 Pure hypercholesterolemia, unspecified: Secondary | ICD-10-CM

## 2018-11-13 DIAGNOSIS — R945 Abnormal results of liver function studies: Secondary | ICD-10-CM

## 2018-11-13 DIAGNOSIS — F439 Reaction to severe stress, unspecified: Secondary | ICD-10-CM

## 2018-11-13 DIAGNOSIS — R5383 Other fatigue: Secondary | ICD-10-CM

## 2018-11-13 DIAGNOSIS — R739 Hyperglycemia, unspecified: Secondary | ICD-10-CM | POA: Diagnosis not present

## 2018-11-13 DIAGNOSIS — E559 Vitamin D deficiency, unspecified: Secondary | ICD-10-CM

## 2018-11-13 MED ORDER — PROGESTERONE MICRONIZED 100 MG PO CAPS: ORAL_CAPSULE | ORAL | 1 refills | Status: DC

## 2018-11-13 NOTE — Progress Notes (Signed)
Patient ID: NAVEAH BRAVE, female   DOB: 10-02-1960, 58 y.o.   MRN: [id number]   Virtual Visit via video Note  This visit type was conducted due to national recommendations for restrictions regarding the COVID-19 pandemic (e.g. social distancing).  This format is felt to be most appropriate for this patient at this time.  All issues noted in this document were discussed and addressed.  No physical exam was performed (except for noted visual exam findings with Video Visits).   I connected with Terance Hart by a video enabled telemedicine application and verified that I am speaking with the correct person using two identifiers. Location patient: home Location provider: work  Persons participating in the virtual visit: patient, provider  I discussed the limitations, risks, security and privacy concerns of performing an evaluation and management service by video and the availability of in person appointments. The patient expressed understanding and agreed to proceed.   Reason for visit: scheduled follow up.    HPI: She reports she is doing better.  Last visit we increased her zoloft to 33m q day.  Doing better and feeling better on this dose of medication.  Handling stress.  Doing well.  Does not feel needs any further intervention.  Staying in due to COVID restrictions.  No fever.  No chest congestion, cough or sob.  Discussed importance of staying active.  No chest pain.  No acid reflux.  No abdominal pain.  Bowels moving.     ROS: See pertinent positives and negatives per HPI.  Past Medical History:  Diagnosis Date  . Allergy   . Hx: UTI (urinary tract infection)   . Hypothyroidism     Past Surgical History:  Procedure Laterality Date  . BREAST BIOPSY Left 2015   negative, stereotactic biopsy  . CESAREAN SECTION    . ENDOMETRIAL ABLATION  2000    Family History  Problem Relation Age of Onset  . Congestive Heart Failure Mother   . Diabetes Mother   . COPD Mother   .  Hypertension Father   . Esophageal cancer Father [phone]      treated at [facility] . Colon polyps Other   . Breast cancer Neg Hx     SOCIAL HX: reviewed.    Current Outpatient Medications:  .  estradiol (ESTRACE) 0.5 MG tablet, take 1 to 2 tablets by mouth once daily, Disp: 180 tablet, Rfl: 2 .  hydrochlorothiazide (HYDRODIURIL) 25 MG tablet, TAKE 1 TABLET(25 MG) BY MOUTH DAILY, Disp: 90 tablet, Rfl: 0 .  meloxicam (MOBIC) 15 MG tablet, Take 15 mg by mouth daily., Disp: , Rfl:  .  mupirocin ointment (BACTROBAN) 2 %, Apply to affected area on toe bid, Disp: 22 g, Rfl: 0 .  pantoprazole (PROTONIX) 40 MG tablet, Take 1 tablet (40 mg total) by mouth daily. Take 30 minutes before breakfast, Disp: 30 tablet, Rfl: 2 .  progesterone (PROMETRIUM) 100 MG capsule, TAKE 1 CAPSULE(100 MG) BY MOUTH DAILY, Disp: 90 capsule, Rfl: 1 .  scopolamine (TRANSDERM-SCOP, 1.5 MG,) 1 MG/3DAYS, Place 1 patch (1.5 mg total) onto the skin every 3 (three) days., Disp: 10 patch, Rfl: 0 .  sertraline (ZOLOFT) 50 MG tablet, Take 1 tablet (50 mg total) by mouth daily., Disp: 30 tablet, Rfl: 3 .  thyroid (ARMOUR THYROID) 90 MG tablet, Take 1 tablet (90 mg total) by mouth daily before breakfast. Name brand only, Disp: 90 tablet, Rfl: 1  EXAM:  GENERAL: alert, oriented, appears well and in no acute  distress  HEENT: atraumatic, conjunttiva clear, no obvious abnormalities on inspection of external nose and ears  NECK: normal movements of the head and neck  LUNGS: on inspection no signs of respiratory distress, breathing rate appears normal, no obvious gross SOB, gasping or wheezing  CV: no obvious cyanosis  PSYCH/NEURO: pleasant and cooperative, no obvious depression or anxiety, speech and thought processing grossly intact  ASSESSMENT AND PLAN:  Discussed the following assessment and plan:  Abnormal liver function tests  Other fatigue  Hypercholesterolemia - Plan: Hepatic function panel, Lipid panel, Basic metabolic  panel  Hyperglycemia - Plan: Hemoglobin G8J, Basic metabolic panel  Hypothyroidism, unspecified type  Stress  Vitamin D deficiency  Abnormal liver function tests Previous ultrasound revealed fatty liver.  Last liver panel wnl.  Continue diet and exercise.  Follow liver function tests.    Fatigue Improved.  On zoloft 25m q day.  Sleeping well.  Feels better.  Follow.    Hypercholesterolemia Low cholesterol diet and exercise.  Follow lipid panel.    Hyperglycemia Low carb diet and exercise.  Follow met b and a1c.   Hypothyroid On thyroid replacement.  Follow tsh.   Stress On zoloft 548mq day now.  Doing better.  Feels better.  Follow.    Vitamin D deficiency Follow vitamin D level.     I discussed the assessment and treatment plan with the patient. The patient was provided an opportunity to ask questions and all were answered. The patient agreed with the plan and demonstrated an understanding of the instructions.   The patient was advised to call back or seek an in-person evaluation if the symptoms worsen or if the condition fails to improve as anticipated.    ChEinar PheasantMD

## 2018-11-18 ENCOUNTER — Encounter: Payer: Self-pay | Admitting: Internal Medicine

## 2018-11-18 NOTE — Assessment & Plan Note (Signed)
Improved.  On zoloft 50mg  q day.  Sleeping well.  Feels better.  Follow.

## 2018-11-18 NOTE — Assessment & Plan Note (Signed)
Low carb diet and exercise.  Follow met b and a1c.  

## 2018-11-18 NOTE — Assessment & Plan Note (Signed)
On zoloft 50mg  q day now.  Doing better.  Feels better.  Follow.

## 2018-11-18 NOTE — Assessment & Plan Note (Signed)
Low cholesterol diet and exercise.  Follow lipid panel.   

## 2018-11-18 NOTE — Assessment & Plan Note (Signed)
On thyroid replacement.  Follow tsh.  

## 2018-11-18 NOTE — Assessment & Plan Note (Signed)
Follow vitamin D level.  

## 2018-11-18 NOTE — Assessment & Plan Note (Signed)
Previous ultrasound revealed fatty liver.  Last liver panel wnl.  Continue diet and exercise.  Follow liver function tests.

## 2019-02-07 ENCOUNTER — Other Ambulatory Visit: Payer: Self-pay | Admitting: Internal Medicine

## 2019-02-08 MED ORDER — HYDROCHLOROTHIAZIDE 25 MG PO TABS: 25.0000 mg | ORAL_TABLET | Freq: Every day | ORAL | 0 refills | Status: DC

## 2019-02-08 NOTE — Telephone Encounter (Signed)
I refilled the hctz.  She needs fasting lab appt.  Overdue.  Also needs physical appt scheduled in the next 1-2 months.

## 2019-02-08 NOTE — Telephone Encounter (Signed)
Last OV 5/20 last BMET 11/19 ok to fill HCTZ?

## 2019-02-14 NOTE — Telephone Encounter (Signed)
LMTCB

## 2019-02-15 ENCOUNTER — Other Ambulatory Visit: Payer: Self-pay | Admitting: Internal Medicine

## 2019-02-15 ENCOUNTER — Encounter: Payer: Self-pay | Admitting: Internal Medicine

## 2019-02-16 NOTE — Telephone Encounter (Signed)
Pt is aware and going to call office to schedule

## 2019-03-12 ENCOUNTER — Encounter: Payer: Self-pay | Admitting: Internal Medicine

## 2019-03-12 ENCOUNTER — Other Ambulatory Visit: Payer: Self-pay

## 2019-03-12 MED ORDER — SERTRALINE HCL 50 MG PO TABS: 50.0000 mg | ORAL_TABLET | Freq: Every day | ORAL | 1 refills | Status: DC

## 2019-04-09 ENCOUNTER — Other Ambulatory Visit (INDEPENDENT_AMBULATORY_CARE_PROVIDER_SITE_OTHER): Payer: BC Managed Care – PPO

## 2019-04-09 ENCOUNTER — Other Ambulatory Visit: Payer: Self-pay

## 2019-04-09 DIAGNOSIS — R739 Hyperglycemia, unspecified: Secondary | ICD-10-CM

## 2019-04-09 DIAGNOSIS — E7800 Pure hypercholesterolemia, unspecified: Secondary | ICD-10-CM | POA: Diagnosis not present

## 2019-04-09 LAB — LIPID PANEL
Cholesterol: 210 mg/dL — ABNORMAL HIGH (ref 0–200)
HDL: 51.2 mg/dL (ref >39.00)
NonHDL: 159
Total CHOL/HDL Ratio: 4
Triglycerides: 238 mg/dL — ABNORMAL HIGH (ref 0.0–149.0)
VLDL: 47.6 mg/dL — ABNORMAL HIGH (ref 0.0–40.0)

## 2019-04-09 LAB — HEPATIC FUNCTION PANEL
ALT: 36 U/L — ABNORMAL HIGH (ref 0–35)
AST: 36 U/L (ref 0–37)
Albumin: 4.4 g/dL (ref 3.5–5.2)
Alkaline Phosphatase: 64 U/L (ref 39–117)
Bilirubin, Direct: 0.1 mg/dL (ref 0.0–0.3)
Total Bilirubin: 0.8 mg/dL (ref 0.2–1.2)
Total Protein: 7 g/dL (ref 6.0–8.3)

## 2019-04-09 LAB — LDL CHOLESTEROL, DIRECT: Direct LDL: 144 mg/dL

## 2019-04-09 LAB — BASIC METABOLIC PANEL
BUN: 14 mg/dL (ref 6–23)
CO2: 27 mEq/L (ref 19–32)
Calcium: 9.4 mg/dL (ref 8.4–10.5)
Chloride: 101 mEq/L (ref 96–112)
Creatinine, Ser: 0.72 mg/dL (ref 0.40–1.20)
GFR: 82.88 mL/min (ref >60.00)
Glucose, Bld: 114 mg/dL — ABNORMAL HIGH (ref 70–99)
Potassium: 3.8 mEq/L (ref 3.5–5.1)
Sodium: 138 mEq/L (ref 135–145)

## 2019-04-09 LAB — HEMOGLOBIN A1C: Hgb A1c MFr Bld: 6.1 % (ref 4.6–6.5)

## 2019-04-14 ENCOUNTER — Encounter: Payer: Self-pay | Admitting: Internal Medicine

## 2019-04-16 NOTE — Telephone Encounter (Signed)
Please call pt and convert her to a doxy appt. I can f/u and see if anything more is needed regarding her respiratory symptoms.

## 2019-04-17 ENCOUNTER — Other Ambulatory Visit: Payer: Self-pay

## 2019-04-17 ENCOUNTER — Other Ambulatory Visit: Payer: Self-pay | Admitting: *Deleted

## 2019-04-17 ENCOUNTER — Ambulatory Visit (INDEPENDENT_AMBULATORY_CARE_PROVIDER_SITE_OTHER): Payer: BC Managed Care – PPO | Admitting: Internal Medicine

## 2019-04-17 VITALS — BP 110/70 | HR 59 | Temp 99.1°F | Ht 66.0 in | Wt 208.0 lb

## 2019-04-17 DIAGNOSIS — Z20822 Contact with and (suspected) exposure to covid-19: Secondary | ICD-10-CM

## 2019-04-17 DIAGNOSIS — R0981 Nasal congestion: Secondary | ICD-10-CM | POA: Diagnosis not present

## 2019-04-17 DIAGNOSIS — R739 Hyperglycemia, unspecified: Secondary | ICD-10-CM | POA: Diagnosis not present

## 2019-04-17 DIAGNOSIS — R05 Cough: Secondary | ICD-10-CM

## 2019-04-17 DIAGNOSIS — R059 Cough, unspecified: Secondary | ICD-10-CM

## 2019-04-17 NOTE — Progress Notes (Signed)
Virtual Visit via video Note  This visit type was conducted due to national recommendations for restrictions regarding the COVID-19 pandemic (e.g. social distancing).  This format is felt to be most appropriate for this patient at this time.  All issues noted in this document were discussed and addressed.  No physical exam was performed (except for noted visual exam findings with Video Visits).   I connected with Carol Jimenez by a video enabled telemedicine application and verified that I am speaking with the correct person using two identifiers. Location patient: home Location provider: work Persons participating in the virtual visit: patient, provider  I discussed the limitations, risks, security and privacy concerns of performing an evaluation and management service by telephone and the availability of in person appointments. The patient expressed understanding and agreed to proceed.   Reason for visit:  Follow up.    HPI: She was scheduled to have her physical today.  She developed congestion, so visit was changed to doxy.  She reports that symptoms started 04/13/19.  Developed hoarseness and some cough.  Some minimal sinus pressure and drainage.  No sore throat.  Left ear bothered her some last week.  Not a significant problem now.  Taking ibuprofen.  No sob.  No chest congestion or chest tightness.  No acid reflux.  No abdominal pain, nausea or vomiting.  No diarrhea.  States tmax 99.1.  Working from home.  She has been trying to stay in due to covid restrictions.  Did keep her grandson recently.  He had a cough.  Has not been around anyone that she knows has been diagnosed with covid.    ROS: See pertinent positives and negatives per HPI.  Past Medical History:  Diagnosis Date  . Allergy   . Hx: UTI (urinary tract infection)   . Hypothyroidism     Past Surgical History:  Procedure Laterality Date  . BREAST BIOPSY Left 2015   negative, stereotactic biopsy  . CESAREAN  SECTION    . ENDOMETRIAL ABLATION  2000    Family History  Problem Relation Age of Onset  . Congestive Heart Failure Mother   . Diabetes Mother   . COPD Mother   . Hypertension Father   . Esophageal cancer Father 26       [facility] at [facility]  . Colon polyps Other   . Breast cancer Neg Hx     SOCIAL HX: reviewed.    Current Outpatient Medications:  .  ARMOUR THYROID 90 MG tablet, TAKE 1 TABLET BY MOUTH EVERY DAY, Disp: 90 tablet, Rfl: 1 .  benzonatate (TESSALON) 100 MG capsule, Take 1 capsule (100 mg total) by mouth 3 (three) times daily as needed for cough., Disp: 21 capsule, Rfl: 0 .  doxycycline (VIBRA-TABS) 100 MG tablet, Take 1 tablet (100 mg total) by mouth 2 (two) times daily., Disp: 20 tablet, Rfl: 0 .  estradiol (ESTRACE) 0.5 MG tablet, take 1 to 2 tablets by mouth once daily, Disp: 180 tablet, Rfl: 2 .  hydrochlorothiazide (HYDRODIURIL) 25 MG tablet, Take 1 tablet (25 mg total) by mouth daily., Disp: 90 tablet, Rfl: 0 .  meloxicam (MOBIC) 15 MG tablet, Take 15 mg by mouth daily., Disp: , Rfl:  .  mupirocin ointment (BACTROBAN) 2 %, Apply to affected area on toe bid, Disp: 22 g, Rfl: 0 .  pantoprazole (PROTONIX) 40 MG tablet, Take 1 tablet (40 mg total) by mouth daily. Take 30 minutes before breakfast, Disp: 30 tablet, Rfl: 2 .  progesterone (  PROMETRIUM) 100 MG capsule, TAKE 1 CAPSULE(100 MG) BY MOUTH DAILY, Disp: 90 capsule, Rfl: 1 .  scopolamine (TRANSDERM-SCOP, 1.5 MG,) 1 MG/3DAYS, Place 1 patch (1.5 mg total) onto the skin every 3 (three) days., Disp: 10 patch, Rfl: 0 .  sertraline (ZOLOFT) 50 MG tablet, Take 1 tablet (50 mg total) by mouth daily., Disp: 90 tablet, Rfl: 1  EXAM:  VITALS per patient if applicable:  38.9, 373/42, 59  GENERAL: alert, oriented, appears well and in no acute distress  HEENT: atraumatic, conjunttiva clear, no obvious abnormalities on inspection of external nose and ears  NECK: normal movements of the head and neck  LUNGS: on inspection no  signs of respiratory distress, breathing rate appears normal, no obvious gross SOB, gasping or wheezing  CV: no obvious cyanosis  PSYCH/NEURO: pleasant and cooperative, no obvious depression or anxiety, speech and thought processing grossly intact  ASSESSMENT AND PLAN:  Discussed the following assessment and plan:  Hyperglycemia Low carb diet and exercise.  Follow met b and a1c.   Congestion of nasal sinus Symptoms as outlined with sinus pressure and cough. No chest congestion, chest pain or sob.  No known covid exposure.  Will send for covid testing.  Treat symptoms - nasacort nasal spray, saline nasal flushes and robitussin DM as directed.  Rest.  Discussed self quarantine.  Follow.      I discussed the assessment and treatment plan with the patient. The patient was provided an opportunity to ask questions and all were answered. The patient agreed with the plan and demonstrated an understanding of the instructions.   The patient was advised to call back or seek an in-person evaluation if the symptoms worsen or if the condition fails to improve as anticipated.   Einar Pheasant, MD

## 2019-04-18 LAB — NOVEL CORONAVIRUS, NAA: SARS-CoV-2, NAA: NOT DETECTED

## 2019-04-19 ENCOUNTER — Encounter: Payer: Self-pay | Admitting: Internal Medicine

## 2019-04-19 NOTE — Telephone Encounter (Signed)
Noted.  If any acute symptoms will need to be seen earlier.

## 2019-04-19 NOTE — Telephone Encounter (Signed)
See if she can do a f/u visit tomorrow - E visit.

## 2019-04-19 NOTE — Telephone Encounter (Signed)
Left message for patient to call back. Need to know if she is agreeable to doxy appt 04/20/19 at 12

## 2019-04-20 ENCOUNTER — Other Ambulatory Visit: Payer: Self-pay

## 2019-04-20 ENCOUNTER — Ambulatory Visit (INDEPENDENT_AMBULATORY_CARE_PROVIDER_SITE_OTHER): Payer: BC Managed Care – PPO | Admitting: Internal Medicine

## 2019-04-20 DIAGNOSIS — J019 Acute sinusitis, unspecified: Secondary | ICD-10-CM | POA: Diagnosis not present

## 2019-04-20 MED ORDER — BENZONATATE 100 MG PO CAPS: 100.0000 mg | ORAL_CAPSULE | Freq: Three times a day (TID) | ORAL | 0 refills | Status: DC | PRN

## 2019-04-20 MED ORDER — DOXYCYCLINE HYCLATE 100 MG PO TABS: 100.0000 mg | ORAL_TABLET | Freq: Two times a day (BID) | ORAL | 0 refills | Status: DC

## 2019-04-20 NOTE — Progress Notes (Signed)
Patient ID: Carol Jimenez, female   DOB: 06/09/1961, 58 y.o.   MRN: [id number]   Virtual Visit via video Note  This visit type was conducted due to national recommendations for restrictions regarding the COVID-19 pandemic (e.g. social distancing).  This format is felt to be most appropriate for this patient at this time.  All issues noted in this document were discussed and addressed.  No physical exam was performed (except for noted visual exam findings with Video Visits).   I connected with Carol Jimenez by a video enabled telemedicine application and verified that I am speaking with the correct person using two identifiers. Location patient: home Location provider: work  Persons participating in the virtual visit: patient, provider  I discussed the limitations, risks, security and privacy concerns of performing an evaluation and management service by video and the availability of in person appointments. The patient expressed understanding and agreed to proceed.   Reason for visit: work in appt.   HPI: Was seen 04/13/19 for congestion and cough.  Treated with nasacort, saline nasal spray and robitusin DM.  Discussed quarantine.  Tested for covid.  covid test negative.  She reports some persistent nasal congestion.  Glands sore.  Irritated throat.  Some "matting" of her eyes.  Increased sinus pressure.  Some headache related to this.  Some persistent cough, but no chest congestion, chest tightness or sob.  Feels like sinus infection.  Taking robitussin and using flonase and saline nasal spray.  Eating.  Has been staying in - quarantined.  No fever.     ROS: See pertinent positives and negatives per HPI.  Past Medical History:  Diagnosis Date  . Allergy   . Hx: UTI (urinary tract infection)   . Hypothyroidism     Past Surgical History:  Procedure Laterality Date  . BREAST BIOPSY Left 2015   negative, stereotactic biopsy  . CESAREAN SECTION    . ENDOMETRIAL ABLATION  2000     Family History  Problem Relation Age of Onset  . Congestive Heart Failure Mother   . Diabetes Mother   . COPD Mother   . Hypertension Father   . Esophageal cancer Father 67       [facility]  . Colon polyps Other   . Breast cancer Neg Hx     SOCIAL HX: reviewed.    Current Outpatient Medications:  .  ARMOUR THYROID 90 MG tablet, TAKE 1 TABLET BY MOUTH EVERY DAY, Disp: 90 tablet, Rfl: 1 .  benzonatate (TESSALON) 100 MG capsule, Take 1 capsule (100 mg total) by mouth 3 (three) times daily as needed for cough., Disp: 21 capsule, Rfl: 0 .  doxycycline (VIBRA-TABS) 100 MG tablet, Take 1 tablet (100 mg total) by mouth 2 (two) times daily., Disp: 20 tablet, Rfl: 0 .  estradiol (ESTRACE) 0.5 MG tablet, take 1 to 2 tablets by mouth once daily, Disp: 180 tablet, Rfl: 2 .  hydrochlorothiazide (HYDRODIURIL) 25 MG tablet, Take 1 tablet (25 mg total) by mouth daily., Disp: 90 tablet, Rfl: 0 .  meloxicam (MOBIC) 15 MG tablet, Take 15 mg by mouth daily., Disp: , Rfl:  .  mupirocin ointment (BACTROBAN) 2 %, Apply to affected area on toe bid, Disp: 22 g, Rfl: 0 .  pantoprazole (PROTONIX) 40 MG tablet, Take 1 tablet (40 mg total) by mouth daily. Take 30 minutes before breakfast, Disp: 30 tablet, Rfl: 2 .  progesterone (PROMETRIUM) 100 MG capsule, TAKE 1 CAPSULE(100 MG) BY MOUTH DAILY, Disp: 90 capsule,  Rfl: 1 .  scopolamine (TRANSDERM-SCOP, 1.5 MG,) 1 MG/3DAYS, Place 1 patch (1.5 mg total) onto the skin every 3 (three) days., Disp: 10 patch, Rfl: 0 .  sertraline (ZOLOFT) 50 MG tablet, Take 1 tablet (50 mg total) by mouth daily., Disp: 90 tablet, Rfl: 1  EXAM:  GENERAL: alert, oriented, appears well and in no acute distress  HEENT: atraumatic, conjunttiva clear, no obvious abnormalities on inspection of external nose and ears  NECK: normal movements of the head and neck  LUNGS: on inspection no signs of respiratory distress, breathing rate appears normal, no obvious gross SOB, gasping or  wheezing  CV: no obvious cyanosis  MS: moves all visible extremities without noticeable abnormality  PSYCH/NEURO: pleasant and cooperative, no obvious depression or anxiety, speech and thought processing grossly intact  ASSESSMENT AND PLAN:  Discussed the following assessment and plan:  Sinusitis Symptoms consistent with sinus infection. Has been using saline nasal spray, flonase and robitussin.  Persistent increased sinus pressure.  covid negative.  Treat with doxycycline as directed.  Probiotic as directed.  Follow.     I discussed the assessment and treatment plan with the patient. The patient was provided an opportunity to ask questions and all were answered. The patient agreed with the plan and demonstrated an understanding of the instructions.   The patient was advised to call back or seek an in-person evaluation if the symptoms worsen or if the condition fails to improve as anticipated.   Einar Pheasant, MD

## 2019-04-22 ENCOUNTER — Encounter: Payer: Self-pay | Admitting: Internal Medicine

## 2019-04-22 DIAGNOSIS — R0981 Nasal congestion: Secondary | ICD-10-CM | POA: Insufficient documentation

## 2019-04-22 NOTE — Assessment & Plan Note (Signed)
Low carb diet and exercise.  Follow met b and a1c.  

## 2019-04-22 NOTE — Assessment & Plan Note (Signed)
Symptoms as outlined with sinus pressure and cough. No chest congestion, chest pain or sob.  No known covid exposure.  Will send for covid testing.  Treat symptoms - nasacort nasal spray, saline nasal flushes and robitussin DM as directed.  Rest.  Discussed self quarantine.  Follow.

## 2019-04-28 ENCOUNTER — Encounter: Payer: Self-pay | Admitting: Internal Medicine

## 2019-04-28 DIAGNOSIS — J329 Chronic sinusitis, unspecified: Secondary | ICD-10-CM | POA: Insufficient documentation

## 2019-04-28 NOTE — Assessment & Plan Note (Signed)
Symptoms consistent with sinus infection. Has been using saline nasal spray, flonase and robitussin.  Persistent increased sinus pressure.  covid negative.  Treat with doxycycline as directed.  Probiotic as directed.  Follow.

## 2019-05-01 ENCOUNTER — Encounter: Payer: Self-pay | Admitting: Internal Medicine

## 2019-05-07 ENCOUNTER — Other Ambulatory Visit: Payer: Self-pay | Admitting: Internal Medicine

## 2019-05-08 MED ORDER — HYDROCHLOROTHIAZIDE 25 MG PO TABS: 25.0000 mg | ORAL_TABLET | Freq: Every day | ORAL | 0 refills | Status: DC

## 2019-06-08 ENCOUNTER — Encounter: Payer: Self-pay | Admitting: Internal Medicine

## 2019-06-08 ENCOUNTER — Other Ambulatory Visit: Payer: Self-pay

## 2019-06-08 MED ORDER — PROGESTERONE MICRONIZED 100 MG PO CAPS: ORAL_CAPSULE | ORAL | 1 refills | Status: DC

## 2019-07-03 ENCOUNTER — Other Ambulatory Visit: Payer: Self-pay

## 2019-07-03 ENCOUNTER — Ambulatory Visit (INDEPENDENT_AMBULATORY_CARE_PROVIDER_SITE_OTHER): Payer: BC Managed Care – PPO | Admitting: Internal Medicine

## 2019-07-03 ENCOUNTER — Encounter: Payer: Self-pay | Admitting: Internal Medicine

## 2019-07-03 VITALS — Ht 66.0 in | Wt 209.0 lb

## 2019-07-03 DIAGNOSIS — R945 Abnormal results of liver function studies: Secondary | ICD-10-CM

## 2019-07-03 DIAGNOSIS — M25561 Pain in right knee: Secondary | ICD-10-CM | POA: Insufficient documentation

## 2019-07-03 DIAGNOSIS — N951 Menopausal and female climacteric states: Secondary | ICD-10-CM | POA: Diagnosis not present

## 2019-07-03 DIAGNOSIS — M25562 Pain in left knee: Secondary | ICD-10-CM

## 2019-07-03 DIAGNOSIS — R739 Hyperglycemia, unspecified: Secondary | ICD-10-CM

## 2019-07-03 DIAGNOSIS — F439 Reaction to severe stress, unspecified: Secondary | ICD-10-CM

## 2019-07-03 DIAGNOSIS — E039 Hypothyroidism, unspecified: Secondary | ICD-10-CM

## 2019-07-03 DIAGNOSIS — R7989 Other specified abnormal findings of blood chemistry: Secondary | ICD-10-CM

## 2019-07-03 DIAGNOSIS — E7800 Pure hypercholesterolemia, unspecified: Secondary | ICD-10-CM | POA: Diagnosis not present

## 2019-07-03 NOTE — Progress Notes (Signed)
Patient ID: Carol Jimenez, female   DOB: 11/02/1960, 59 y.o.   MRN: [id number]   Virtual Visit via video Note  This visit type was conducted due to national recommendations for restrictions regarding the COVID-19 pandemic (e.g. social distancing).  This format is felt to be most appropriate for this patient at this time.  All issues noted in this document were discussed and addressed.  No physical exam was performed (except for noted visual exam findings with Video Visits).   I connected with Carol Jimenez by a video enabled telemedicine application and verified that I am speaking with the correct person using two identifiers. Location patient: home Location provider: work  Persons participating in the virtual visit: patient, provider  The limitations, risks, security and privacy concerns of performing an evaluation and management service by video and the availability of in person appointments have been discussed.The patient expressed understanding and agreed to proceed.   Reason for visit: scheduled follow up.   HPI: She reports she is doing relatively well.  Having increased pain in both knees.  Limits her activity.  Increased pain with walking.  Is alternating mobic and tylenol. Tylenol arthritis helps.  Discussed referral back to ortho.  She is in agreement.  Handling stress.  Taking zoloft.  Doing well on this dose.  Still having hot flashes and some increased fatigue.  On estrogen.  Not helping.  Discussed checking thyroid.  No chest pain.  Does report decreased stamina.  No increased sinus congestion or chest congestion.  Watching carb intake.  Up to date with colonoscopy and mammogram.  Discussed shingles vaccine.     ROS: See pertinent positives and negatives per HPI.  Past Medical History:  Diagnosis Date  . Allergy   . Hx: UTI (urinary tract infection)   . Hypothyroidism     Past Surgical History:  Procedure Laterality Date  . BREAST BIOPSY Left 2015   negative,  stereotactic biopsy  . CESAREAN SECTION    . ENDOMETRIAL ABLATION  2000    Family History  Problem Relation Age of Onset  . Congestive Heart Failure Mother   . Diabetes Mother   . COPD Mother   . Hypertension Father   . Esophageal cancer Father 57       treated at [facility]  . Colon polyps Other   . Breast cancer Neg Hx     SOCIAL HX: reviewed.    Current Outpatient Medications:  .  ARMOUR THYROID 90 MG tablet, TAKE 1 TABLET BY MOUTH EVERY DAY, Disp: 90 tablet, Rfl: 1 .  benzonatate (TESSALON) 100 MG capsule, Take 1 capsule (100 mg total) by mouth 3 (three) times daily as needed for cough., Disp: 21 capsule, Rfl: 0 .  doxycycline (VIBRA-TABS) 100 MG tablet, Take 1 tablet (100 mg total) by mouth 2 (two) times daily., Disp: 20 tablet, Rfl: 0 .  estradiol (ESTRACE) 0.5 MG tablet, take 1 to 2 tablets by mouth once daily, Disp: 180 tablet, Rfl: 2 .  hydrochlorothiazide (HYDRODIURIL) 25 MG tablet, Take 1 tablet (25 mg total) by mouth daily., Disp: 90 tablet, Rfl: 0 .  meloxicam (MOBIC) 15 MG tablet, Take 15 mg by mouth daily., Disp: , Rfl:  .  mupirocin ointment (BACTROBAN) 2 %, Apply to affected area on toe bid, Disp: 22 g, Rfl: 0 .  pantoprazole (PROTONIX) 40 MG tablet, Take 1 tablet (40 mg total) by mouth daily. Take 30 minutes before breakfast, Disp: 30 tablet, Rfl: 2 .  progesterone (PROMETRIUM) 100 MG  capsule, TAKE 1 CAPSULE(100 MG) BY MOUTH DAILY, Disp: 90 capsule, Rfl: 1 .  scopolamine (TRANSDERM-SCOP, 1.5 MG,) 1 MG/3DAYS, Place 1 patch (1.5 mg total) onto the skin every 3 (three) days., Disp: 10 patch, Rfl: 0 .  sertraline (ZOLOFT) 50 MG tablet, Take 1 tablet (50 mg total) by mouth daily., Disp: 90 tablet, Rfl: 1  EXAM:  GENERAL: alert, oriented, appears well and in no acute distress  HEENT: atraumatic, conjunttiva clear, no obvious abnormalities on inspection of external nose and ears  NECK: normal movements of the head and neck  LUNGS: on inspection no signs of respiratory  distress, breathing rate appears normal, no obvious gross SOB, gasping or wheezing  CV: no obvious cyanosis  PSYCH/NEURO: pleasant and cooperative, no obvious depression or anxiety, speech and thought processing grossly intact  ASSESSMENT AND PLAN:  Discussed the following assessment and plan:  Abnormal liver function tests Previous ultrasound revealed fatty liver.  Continue diet and exercise.  Follow liver function tests.    Hypercholesterolemia Low cholesterol diet and exercise.  Follow lipid panel.    Hyperglycemia Low carb diet and exercise.  Follow met b and a1c.   Hypothyroid On thyroid replacement.  Follow tsh.  Plan for recheck as outlined.    Knee pain, bilateral Persistent pain.  Limits activity.  Alternating mobic and tylenol.  Refer back to ortho.    Menopausal symptoms On estrogen.  Check tsh.   Stress On zoloft.  Appears to be doing well on this dose.  Follow.     Orders Placed This Encounter  Procedures  . CBC with Differential/Platelet    Standing Status:   Future    Standing Expiration Date:   07/02/2020  . TSH    Standing Status:   Future    Standing Expiration Date:   07/02/2020  . Basic metabolic panel (future)    Standing Status:   Future    Standing Expiration Date:   07/02/2020  . Ambulatory referral to Orthopedic Surgery    Referral Priority:   Routine    Referral Type:   Surgical    Referral Reason:   Specialty Services Required    Requested Specialty:   Orthopedic Surgery    Number of Visits Requested:   1     I discussed the assessment and treatment plan with the patient. The patient was provided an opportunity to ask questions and all were answered. The patient agreed with the plan and demonstrated an understanding of the instructions.   The patient was advised to call back or seek an in-person evaluation if the symptoms worsen or if the condition fails to improve as anticipated.    Einar Pheasant, MD

## 2019-07-08 ENCOUNTER — Encounter: Payer: Self-pay | Admitting: Internal Medicine

## 2019-07-08 NOTE — Assessment & Plan Note (Signed)
On zoloft.  Appears to be doing well on this dose.  Follow.

## 2019-07-08 NOTE — Assessment & Plan Note (Signed)
On estrogen.  Check tsh.

## 2019-07-08 NOTE — Assessment & Plan Note (Signed)
Low carb diet and exercise.  Follow met b and a1c.  

## 2019-07-08 NOTE — Assessment & Plan Note (Signed)
Previous ultrasound revealed fatty liver.  Continue diet and exercise.  Follow liver function tests.   

## 2019-07-08 NOTE — Assessment & Plan Note (Signed)
On thyroid replacement.  Follow tsh.  Plan for recheck as outlined.

## 2019-07-08 NOTE — Assessment & Plan Note (Signed)
Persistent pain.  Limits activity.  Alternating mobic and tylenol.  Refer back to ortho.

## 2019-07-08 NOTE — Assessment & Plan Note (Signed)
Low cholesterol diet and exercise.  Follow lipid panel.   

## 2019-07-10 ENCOUNTER — Telehealth: Payer: BC Managed Care – PPO | Admitting: Physician Assistant

## 2019-07-10 ENCOUNTER — Encounter: Payer: Self-pay | Admitting: Internal Medicine

## 2019-07-10 DIAGNOSIS — N390 Urinary tract infection, site not specified: Secondary | ICD-10-CM | POA: Diagnosis not present

## 2019-07-10 MED ORDER — CEPHALEXIN 500 MG PO CAPS: 500.0000 mg | ORAL_CAPSULE | Freq: Two times a day (BID) | ORAL | 0 refills | Status: AC

## 2019-07-10 NOTE — Progress Notes (Signed)

## 2019-07-17 ENCOUNTER — Other Ambulatory Visit: Payer: Self-pay

## 2019-07-17 ENCOUNTER — Other Ambulatory Visit (INDEPENDENT_AMBULATORY_CARE_PROVIDER_SITE_OTHER): Payer: BC Managed Care – PPO

## 2019-07-17 DIAGNOSIS — N951 Menopausal and female climacteric states: Secondary | ICD-10-CM | POA: Diagnosis not present

## 2019-07-17 LAB — BASIC METABOLIC PANEL
BUN: 13 mg/dL (ref 6–23)
CO2: 29 mEq/L (ref 19–32)
Calcium: 9.6 mg/dL (ref 8.4–10.5)
Chloride: 99 mEq/L (ref 96–112)
Creatinine, Ser: 0.68 mg/dL (ref 0.40–1.20)
GFR: 88.45 mL/min (ref >60.00)
Glucose, Bld: 106 mg/dL — ABNORMAL HIGH (ref 70–99)
Potassium: 3.5 mEq/L (ref 3.5–5.1)
Sodium: 137 mEq/L (ref 135–145)

## 2019-07-17 LAB — CBC WITH DIFFERENTIAL/PLATELET
Basophils Absolute: 0 10*3/uL (ref 0.0–0.1)
Basophils Relative: 0.7 % (ref 0.0–3.0)
Eosinophils Absolute: 0.2 10*3/uL (ref 0.0–0.7)
Eosinophils Relative: 2.5 % (ref 0.0–5.0)
HCT: 44.3 % (ref 36.0–46.0)
Hemoglobin: 15.1 g/dL — ABNORMAL HIGH (ref 12.0–15.0)
Lymphocytes Relative: 34.2 % (ref 12.0–46.0)
Lymphs Abs: 2.3 10*3/uL (ref 0.7–4.0)
MCHC: 34.1 g/dL (ref 30.0–36.0)
MCV: 94.3 fl (ref 78.0–100.0)
Monocytes Absolute: 0.6 10*3/uL (ref 0.1–1.0)
Monocytes Relative: 8.8 % (ref 3.0–12.0)
Neutro Abs: 3.5 10*3/uL (ref 1.4–7.7)
Neutrophils Relative %: 53.8 % (ref 43.0–77.0)
Platelets: 211 10*3/uL (ref 150.0–400.0)
RBC: 4.7 Mil/uL (ref 3.87–5.11)
RDW: 13 % (ref 11.5–15.5)
WBC: 6.6 10*3/uL (ref 4.0–10.5)

## 2019-07-17 LAB — TSH: TSH: 1.74 u[IU]/mL (ref 0.35–4.50)

## 2019-07-19 ENCOUNTER — Encounter: Payer: Self-pay | Admitting: Internal Medicine

## 2019-08-08 ENCOUNTER — Other Ambulatory Visit: Payer: Self-pay | Admitting: Internal Medicine

## 2019-08-09 ENCOUNTER — Other Ambulatory Visit: Payer: Self-pay | Admitting: Internal Medicine

## 2019-08-09 ENCOUNTER — Other Ambulatory Visit: Payer: Self-pay

## 2019-08-09 ENCOUNTER — Encounter: Payer: Self-pay | Admitting: Internal Medicine

## 2019-08-09 MED ORDER — HYDROCHLOROTHIAZIDE 25 MG PO TABS: 25.0000 mg | ORAL_TABLET | Freq: Every day | ORAL | 1 refills | Status: DC

## 2019-08-13 ENCOUNTER — Other Ambulatory Visit: Payer: Self-pay | Admitting: Internal Medicine

## 2019-08-13 DIAGNOSIS — Z1231 Encounter for screening mammogram for malignant neoplasm of breast: Secondary | ICD-10-CM

## 2019-08-14 ENCOUNTER — Ambulatory Visit
Admission: RE | Admit: 2019-08-14 | Discharge: 2019-08-14 | Disposition: A | Discharge status: Home / Self Care | Payer: BC Managed Care – PPO | Source: Ambulatory Visit | Attending: Internal Medicine | Admitting: Internal Medicine

## 2019-08-14 DIAGNOSIS — Z1231 Encounter for screening mammogram for malignant neoplasm of breast: Secondary | ICD-10-CM | POA: Diagnosis not present

## 2019-08-24 ENCOUNTER — Encounter: Payer: Self-pay | Admitting: Internal Medicine

## 2019-08-24 ENCOUNTER — Other Ambulatory Visit: Payer: Self-pay

## 2019-08-24 MED ORDER — SERTRALINE HCL 50 MG PO TABS: 50.0000 mg | ORAL_TABLET | Freq: Every day | ORAL | 1 refills | Status: DC

## 2019-08-24 MED ORDER — ARMOUR THYROID 90 MG PO TABS: 90.0000 mg | ORAL_TABLET | Freq: Every day | ORAL | 1 refills | Status: DC

## 2019-08-24 MED ORDER — ESTRADIOL 0.5 MG PO TABS: ORAL_TABLET | ORAL | 2 refills | Status: DC

## 2019-10-10 ENCOUNTER — Telehealth: Payer: BC Managed Care – PPO | Admitting: Nurse Practitioner

## 2019-10-10 DIAGNOSIS — N390 Urinary tract infection, site not specified: Secondary | ICD-10-CM

## 2019-10-10 MED ORDER — CEPHALEXIN 500 MG PO CAPS: 500.0000 mg | ORAL_CAPSULE | Freq: Two times a day (BID) | ORAL | 0 refills | Status: DC

## 2019-10-10 NOTE — Progress Notes (Signed)

## 2019-11-08 ENCOUNTER — Ambulatory Visit: Payer: BC Managed Care – PPO | Admitting: Internal Medicine

## 2019-11-08 ENCOUNTER — Other Ambulatory Visit: Payer: Self-pay

## 2019-11-08 ENCOUNTER — Encounter: Payer: Self-pay | Admitting: Internal Medicine

## 2019-11-08 DIAGNOSIS — E039 Hypothyroidism, unspecified: Secondary | ICD-10-CM

## 2019-11-08 DIAGNOSIS — R079 Chest pain, unspecified: Secondary | ICD-10-CM | POA: Diagnosis not present

## 2019-11-08 DIAGNOSIS — M25561 Pain in right knee: Secondary | ICD-10-CM

## 2019-11-08 DIAGNOSIS — R7989 Other specified abnormal findings of blood chemistry: Secondary | ICD-10-CM

## 2019-11-08 DIAGNOSIS — R739 Hyperglycemia, unspecified: Secondary | ICD-10-CM

## 2019-11-08 DIAGNOSIS — R945 Abnormal results of liver function studies: Secondary | ICD-10-CM

## 2019-11-08 DIAGNOSIS — E7800 Pure hypercholesterolemia, unspecified: Secondary | ICD-10-CM | POA: Diagnosis not present

## 2019-11-08 DIAGNOSIS — K219 Gastro-esophageal reflux disease without esophagitis: Secondary | ICD-10-CM

## 2019-11-08 DIAGNOSIS — F439 Reaction to severe stress, unspecified: Secondary | ICD-10-CM

## 2019-11-08 DIAGNOSIS — M25562 Pain in left knee: Secondary | ICD-10-CM

## 2019-11-08 DIAGNOSIS — H9190 Unspecified hearing loss, unspecified ear: Secondary | ICD-10-CM

## 2019-11-08 MED ORDER — PANTOPRAZOLE SODIUM 40 MG PO TBEC: 40.0000 mg | DELAYED_RELEASE_TABLET | Freq: Every day | ORAL | 2 refills | Status: DC

## 2019-11-08 NOTE — Patient Instructions (Signed)
Restart protonix daily.

## 2019-11-08 NOTE — Progress Notes (Signed)
Patient ID: SAMYRIA RUDIE, female   DOB: June 04, 1961, 59 y.o.   MRN: [id number]   Subjective:    Patient ID: [name], female    DOB: [birth year], 60 y.o.   MRN: [id number]  HPI This visit occurred during the SARS-CoV-2 public health emergency.  Safety protocols were in place, including screening questions prior to the visit, additional usage of staff PPE, and extensive cleaning of exam room while observing appropriate contact time as indicated for disinfecting solutions.  Patient here for a scheduled follow up.  She has seen ortho for her knees - OA.  S/p injection.  Still issues.  Discussed f/u with ortho.  She is working from home.  Handling stress. On zoloft.  Appears to be stable.  Does report noticing some chest pain.  Breathing overall stable.  No increased cough or congestion reported.  Question of GI irritation - acid.  On meloxicam.  No increased abdominal pain reported.  Bowels stable.  Some change in hearing.  Discussed ENT referral.    Past Medical History:  Diagnosis Date  . Allergy   . Hx: UTI (urinary tract infection)   . Hypothyroidism    Past Surgical History:  Procedure Laterality Date  . BREAST BIOPSY Left 2015   negative, stereotactic biopsy  . CESAREAN SECTION    . ENDOMETRIAL ABLATION  2000   Family History  Problem Relation Age of Onset  . Congestive Heart Failure Mother   . Diabetes Mother   . COPD Mother   . Hypertension Father   . Esophageal cancer Father 52       [facility] at [facility]  . Colon polyps Other   . Breast cancer Neg Hx    Social History   Socioeconomic History  . Marital status: Married    Spouse name: Not on file  . Number of children: Not on file  . Years of education: Not on file  . Highest education level: Not on file  Occupational History  . Not on file  Tobacco Use  . Smoking status: Never Smoker  . Smokeless tobacco: Never Used  Substance and Sexual Activity  . Alcohol use: Yes    Alcohol/week: 0.0 standard drinks   Comment: rare  . Drug use: No  . Sexual activity: Not on file  Other Topics Concern  . Not on file  Social History Narrative  . Not on file   Social Determinants of Health   Financial Resource Strain:   . Difficulty of Paying Living Expenses:   Food Insecurity:   . Worried About Charity fundraiser in the Last Year:   . Arboriculturist in the Last Year:   Transportation Needs:   . Film/video editor (Medical):   Marland Kitchen Lack of Transportation (Non-Medical):   Physical Activity:   . Days of Exercise per Week:   . Minutes of Exercise per Session:   Stress:   . Feeling of Stress :   Social Connections:   . Frequency of Communication with Friends and Family:   . Frequency of Social Gatherings with Friends and Family:   . Attends Religious Services:   . Active Member of Clubs or Organizations:   . Attends Archivist Meetings:   Marland Kitchen Marital Status:     Outpatient Encounter Medications as of 11/08/2019  Medication Sig  . ARMOUR THYROID 90 MG tablet Take 1 tablet (90 mg total) by mouth daily.  Marland Kitchen estradiol (ESTRACE) 0.5 MG tablet take 1 to 2 tablets  by mouth once daily  . hydrochlorothiazide (HYDRODIURIL) 25 MG tablet Take 1 tablet (25 mg total) by mouth daily.  . meloxicam (MOBIC) 15 MG tablet Take 15 mg by mouth daily.  . pantoprazole (PROTONIX) 40 MG tablet Take 1 tablet (40 mg total) by mouth daily. Take 30 minutes before breakfast  . progesterone (PROMETRIUM) 100 MG capsule TAKE 1 CAPSULE(100 MG) BY MOUTH DAILY  . sertraline (ZOLOFT) 50 MG tablet Take 1 tablet (50 mg total) by mouth daily.  . [DISCONTINUED] benzonatate (TESSALON) 100 MG capsule Take 1 capsule (100 mg total) by mouth 3 (three) times daily as needed for cough.  . [DISCONTINUED] cephALEXin (KEFLEX) 500 MG capsule Take 1 capsule (500 mg total) by mouth 2 (two) times daily.  . [DISCONTINUED] doxycycline (VIBRA-TABS) 100 MG tablet Take 1 tablet (100 mg total) by mouth 2 (two) times daily.  . [DISCONTINUED]  mupirocin ointment (BACTROBAN) 2 % Apply to affected area on toe bid  . [DISCONTINUED] pantoprazole (PROTONIX) 40 MG tablet Take 1 tablet (40 mg total) by mouth daily. Take 30 minutes before breakfast  . [DISCONTINUED] scopolamine (TRANSDERM-SCOP, 1.5 MG,) 1 MG/3DAYS Place 1 patch (1.5 mg total) onto the skin every 3 (three) days.   No facility-administered encounter medications on file as of 11/08/2019.    Review of Systems  Constitutional: Negative for appetite change and unexpected weight change.  HENT: Negative for congestion and sinus pressure.   Respiratory: Negative for cough and shortness of breath.   Cardiovascular: Positive for chest pain. Negative for palpitations and leg swelling.  Gastrointestinal: Negative for abdominal pain, diarrhea, nausea and vomiting.  Genitourinary: Negative for difficulty urinating and dysuria.  Musculoskeletal: Negative for joint swelling and myalgias.       Persistent knee pain as outlined.    Skin: Negative for color change and rash.  Neurological: Negative for dizziness, light-headedness and headaches.  Psychiatric/Behavioral: Negative for agitation and dysphoric mood.       Objective:    Physical Exam Vitals reviewed.  Constitutional:      General: She is not in acute distress.    Appearance: Normal appearance.  HENT:     Head: Normocephalic and atraumatic.     Right Ear: External ear normal.     Left Ear: External ear normal.  Eyes:     General: No scleral icterus.       Right eye: No discharge.        Left eye: No discharge.     Conjunctiva/sclera: Conjunctivae normal.  Neck:     Thyroid: No thyromegaly.  Cardiovascular:     Rate and Rhythm: Normal rate and regular rhythm.  Pulmonary:     Effort: No respiratory distress.     Breath sounds: Normal breath sounds. No wheezing.  Abdominal:     General: Bowel sounds are normal.     Palpations: Abdomen is soft.     Tenderness: There is no abdominal tenderness.  Musculoskeletal:         General: No swelling or tenderness.     Cervical back: Neck supple. No tenderness.  Lymphadenopathy:     Cervical: No cervical adenopathy.  Skin:    Findings: No erythema or rash.  Neurological:     Mental Status: She is alert.  Psychiatric:        Mood and Affect: Mood normal.        Behavior: Behavior normal.     BP 124/70   Pulse 80   Temp 97.7 F (36.5 C)  Resp 16   Ht '5\' 6"'  (1.676 m)   Wt 206 lb (93.4 kg)   LMP 12/26/2012   SpO2 98%   BMI 33.25 kg/m  Wt Readings from Last 3 Encounters:  11/08/19 206 lb (93.4 kg)  07/03/19 209 lb (94.8 kg)  04/17/19 208 lb (94.3 kg)     Lab Results  Component Value Date   WBC 6.6 07/17/2019   HGB 15.1 (H) 07/17/2019   HCT 44.3 07/17/2019   PLT 211.0 07/17/2019   GLUCOSE 109 (H) 11/15/2019   CHOL 203 (H) 11/15/2019   TRIG 221.0 (H) 11/15/2019   HDL 51.20 11/15/2019   LDLDIRECT 127.0 11/15/2019   LDLCALC 108 (H) 01/16/2018   ALT 23 11/15/2019   AST 20 11/15/2019   NA 137 11/15/2019   K 3.6 11/15/2019   CL 101 11/15/2019   CREATININE 0.71 11/15/2019   BUN 16 11/15/2019   CO2 30 11/15/2019   TSH 1.74 07/17/2019   HGBA1C 5.6 11/15/2019    MM 3D SCREEN BREAST BILATERAL  Result Date: 08/14/2019 CLINICAL DATA:  Screening. EXAM: DIGITAL SCREENING BILATERAL MAMMOGRAM WITH TOMO AND CAD COMPARISON:  Previous exam(s). ACR Breast Density Category b: There are scattered areas of fibroglandular density. FINDINGS: There are no findings suspicious for malignancy. Images were processed with CAD. IMPRESSION: No mammographic evidence of malignancy. A result letter of this screening mammogram will be mailed directly to the patient. RECOMMENDATION: Screening mammogram in one year. (Code:SM-B-01Y) BI-RADS CATEGORY  1: Negative. Electronically Signed   By: Ammie Ferrier M.D.   On: 08/14/2019 16:22       Assessment & Plan:   Problem List Items Addressed This Visit    Abnormal liver function tests    Previous ultrasound revealed  fatty liver.  Diet and exercise.  Follow liver function tests.        Acid reflux    Question of acid reflux.  Restart protonix.  Stop meloxicam.  Follow.        Relevant Medications   pantoprazole (PROTONIX) 40 MG tablet   Change in hearing    Change in hearing.  Discussed further w/up and evaluation.  No headache.  No dizziness.  Refer to ENT for evaluation.        Relevant Orders   Ambulatory referral to ENT   Chest pain    Does report chest pain as outlined.  EKG - SR with flattening T waves in v4-v6.  Given risk factors, symptoms and EKG, I do feel further cardiac evaluation is warranted.  Refer to cardiology for question of need for cardiac w/up.        Relevant Orders   EKG 12-Lead   Ambulatory referral to Cardiology   Hypercholesterolemia    Low cholesterol diet and exercise.  Follow lipid panel.       Hyperglycemia    Low carb diet and exercise.  Follow met b and a1c.        Hypothyroid    On thyroid replacement.  Follow tsh.        Knee pain, bilateral    S/p injection.  Persistent issues.  Seeing ortho.  Recommended f/u with ortho given persistent problems.        Stress    On zoloft.  Appears to be stable.  Follow.            Einar Pheasant, MD

## 2019-11-13 ENCOUNTER — Telehealth: Payer: Self-pay | Admitting: *Deleted

## 2019-11-13 DIAGNOSIS — R739 Hyperglycemia, unspecified: Secondary | ICD-10-CM

## 2019-11-13 DIAGNOSIS — E559 Vitamin D deficiency, unspecified: Secondary | ICD-10-CM

## 2019-11-13 DIAGNOSIS — E7800 Pure hypercholesterolemia, unspecified: Secondary | ICD-10-CM

## 2019-11-13 NOTE — Telephone Encounter (Signed)
Orders placed for labs

## 2019-11-13 NOTE — Telephone Encounter (Signed)
Please place future orders for lab appt.  

## 2019-11-15 ENCOUNTER — Other Ambulatory Visit (INDEPENDENT_AMBULATORY_CARE_PROVIDER_SITE_OTHER): Payer: BC Managed Care – PPO

## 2019-11-15 ENCOUNTER — Other Ambulatory Visit: Payer: Self-pay

## 2019-11-15 DIAGNOSIS — E7800 Pure hypercholesterolemia, unspecified: Secondary | ICD-10-CM | POA: Diagnosis not present

## 2019-11-15 DIAGNOSIS — R739 Hyperglycemia, unspecified: Secondary | ICD-10-CM

## 2019-11-15 DIAGNOSIS — E559 Vitamin D deficiency, unspecified: Secondary | ICD-10-CM

## 2019-11-15 LAB — LIPID PANEL
Cholesterol: 203 mg/dL — ABNORMAL HIGH (ref 0–200)
HDL: 51.2 mg/dL (ref >39.00)
NonHDL: 152.24
Total CHOL/HDL Ratio: 4
Triglycerides: 221 mg/dL — ABNORMAL HIGH (ref 0.0–149.0)
VLDL: 44.2 mg/dL — ABNORMAL HIGH (ref 0.0–40.0)

## 2019-11-15 LAB — HEPATIC FUNCTION PANEL
ALT: 23 U/L (ref 0–35)
AST: 20 U/L (ref 0–37)
Albumin: 4.1 g/dL (ref 3.5–5.2)
Alkaline Phosphatase: 71 U/L (ref 39–117)
Bilirubin, Direct: 0.2 mg/dL (ref 0.0–0.3)
Total Bilirubin: 0.4 mg/dL (ref 0.2–1.2)
Total Protein: 7 g/dL (ref 6.0–8.3)

## 2019-11-15 LAB — HEMOGLOBIN A1C: Hgb A1c MFr Bld: 5.6 % (ref 4.6–6.5)

## 2019-11-15 LAB — BASIC METABOLIC PANEL
BUN: 16 mg/dL (ref 6–23)
CO2: 30 mEq/L (ref 19–32)
Calcium: 9.3 mg/dL (ref 8.4–10.5)
Chloride: 101 mEq/L (ref 96–112)
Creatinine, Ser: 0.71 mg/dL (ref 0.40–1.20)
GFR: 84.06 mL/min (ref >60.00)
Glucose, Bld: 109 mg/dL — ABNORMAL HIGH (ref 70–99)
Potassium: 3.6 mEq/L (ref 3.5–5.1)
Sodium: 137 mEq/L (ref 135–145)

## 2019-11-15 LAB — LDL CHOLESTEROL, DIRECT: Direct LDL: 127 mg/dL

## 2019-11-15 LAB — VITAMIN D 25 HYDROXY (VIT D DEFICIENCY, FRACTURES): VITD: 40.05 ng/mL (ref 30.00–100.00)

## 2019-11-19 ENCOUNTER — Encounter: Payer: Self-pay | Admitting: Internal Medicine

## 2019-11-19 DIAGNOSIS — H9190 Unspecified hearing loss, unspecified ear: Secondary | ICD-10-CM | POA: Insufficient documentation

## 2019-11-19 DIAGNOSIS — K219 Gastro-esophageal reflux disease without esophagitis: Secondary | ICD-10-CM | POA: Insufficient documentation

## 2019-11-19 NOTE — Assessment & Plan Note (Addendum)
Change in hearing.  Discussed further w/up and evaluation.  No headache.  No dizziness.  Refer to ENT for evaluation.

## 2019-11-19 NOTE — Assessment & Plan Note (Signed)
On thyroid replacement.  Follow tsh.  

## 2019-11-19 NOTE — Assessment & Plan Note (Signed)
S/p injection.  Persistent issues.  Seeing ortho.  Recommended f/u with ortho given persistent problems.

## 2019-11-19 NOTE — Assessment & Plan Note (Signed)
Low carb diet and exercise.  Follow met b and a1c.   

## 2019-11-19 NOTE — Assessment & Plan Note (Signed)
Low cholesterol diet and exercise.  Follow lipid panel.   

## 2019-11-19 NOTE — Assessment & Plan Note (Signed)
Question of acid reflux.  Restart protonix.  Stop meloxicam.  Follow.

## 2019-11-19 NOTE — Assessment & Plan Note (Signed)
Previous ultrasound revealed fatty liver.  Diet and exercise. Follow liver function tests.  

## 2019-11-19 NOTE — Assessment & Plan Note (Signed)
On zoloft.  Appears to be stable.  Follow.  

## 2019-11-19 NOTE — Assessment & Plan Note (Signed)
Does report chest pain as outlined.  EKG - SR with flattening T waves in v4-v6.  Given risk factors, symptoms and EKG, I do feel further cardiac evaluation is warranted.  Refer to cardiology for question of need for cardiac w/up.

## 2019-11-22 ENCOUNTER — Encounter: Payer: Self-pay | Admitting: Cardiology

## 2019-11-22 ENCOUNTER — Other Ambulatory Visit: Payer: Self-pay | Admitting: Internal Medicine

## 2019-11-22 ENCOUNTER — Other Ambulatory Visit: Payer: Self-pay

## 2019-11-22 ENCOUNTER — Ambulatory Visit: Payer: BC Managed Care – PPO | Admitting: Cardiology

## 2019-11-22 VITALS — BP 110/80 | HR 73 | Ht 66.0 in | Wt 203.5 lb

## 2019-11-22 DIAGNOSIS — R0600 Dyspnea, unspecified: Secondary | ICD-10-CM

## 2019-11-22 NOTE — Progress Notes (Signed)
Cardiology Office Note:    Date:  11/22/2019   ID:  Carol Jimenez, DOB Feb 22, 1961, MRN [id number]  PCP:  Dale [place], MD  Cardiologist:  Debbe Odea, MD  Electrophysiologist:  None   Referring MD: Dale Vermillion, MD   Chief Complaint  Patient presents with  . New Patient (Initial Visit)    Referred by Dr. Lorin Picket for DOE-Patient denies chest pain; Meds verbally reviewed with patient.   Carol Jimenez is a 59 y.o. female who is being seen today for the evaluation of shortness of breath at the request of Dale Hardin, MD.   History of Present Illness:    Carol Jimenez is a 59 y.o. female with a hx of hypothyroidism who presents due to shortness of breath.  Patient states having shortness of breath with exertion over the past year which has worsened of late.  She has history of knee arthritis which she thinks might be contributing.  Also she states being out of shape that could also be contributing.  She denies any symptoms of chest pain, palpitations.  She has a history of lower extremity swelling for which she takes HCTZ.  She denies smoking, denies any history of heart disease.  She otherwise feels well and has no other concerns at this point.  Past Medical History:  Diagnosis Date  . Allergy   . Hx: UTI (urinary tract infection)   . Hypothyroidism     Past Surgical History:  Procedure Laterality Date  . BREAST BIOPSY Left 2015   negative, stereotactic biopsy  . CESAREAN SECTION    . ENDOMETRIAL ABLATION  2000  . KNEE SURGERY Left     Current Medications: Current Meds  Medication Sig  . ARMOUR THYROID 90 MG tablet Take 1 tablet (90 mg total) by mouth daily.  Marland Kitchen estradiol (ESTRACE) 0.5 MG tablet take 1 to 2 tablets by mouth once daily  . hydrochlorothiazide (HYDRODIURIL) 25 MG tablet Take 1 tablet (25 mg total) by mouth daily.  . meloxicam (MOBIC) 15 MG tablet Take 15 mg by mouth daily.  . pantoprazole (PROTONIX) 40 MG tablet Take 1 tablet (40 mg  total) by mouth daily. Take 30 minutes before breakfast  . progesterone (PROMETRIUM) 100 MG capsule TAKE 1 CAPSULE(100 MG) BY MOUTH DAILY  . sertraline (ZOLOFT) 50 MG tablet Take 1 tablet (50 mg total) by mouth daily.  Marland Kitchen tretinoin (RETIN-A) 0.025 % cream Apply 1 application topically as needed.     Allergies:   Sulfa antibiotics   Social History   Socioeconomic History  . Marital status: Married    Spouse name: Not on file  . Number of children: Not on file  . Years of education: Not on file  . Highest education level: Not on file  Occupational History  . Not on file  Tobacco Use  . Smoking status: Never Smoker  . Smokeless tobacco: Never Used  Substance and Sexual Activity  . Alcohol use: Yes    Alcohol/week: 0.0 standard drinks    Comment: rare  . Drug use: No  . Sexual activity: Not on file  Other Topics Concern  . Not on file  Social History Narrative  . Not on file   Social Determinants of Health   Financial Resource Strain:   . Difficulty of Paying Living Expenses:   Food Insecurity:   . Worried About Programme researcher, broadcasting/film/video in the Last Year:   . Barista in the Last Year:   Transportation Needs:   .  Lack of Transportation (Medical):   Marland Kitchen Lack of Transportation (Non-Medical):   Physical Activity:   . Days of Exercise per Week:   . Minutes of Exercise per Session:   Stress:   . Feeling of Stress :   Social Connections:   . Frequency of Communication with Friends and Family:   . Frequency of Social Gatherings with Friends and Family:   . Attends Religious Services:   . Active Member of Clubs or Organizations:   . Attends Banker Meetings:   Marland Kitchen Marital Status:      Family History: The patient's family history includes COPD in her mother; Colon polyps in an other family member; Congestive Heart Failure in her mother; Diabetes in her mother and sister; Esophageal cancer (age of onset: 20) in her father; Hypertension in her father, sister, and  sister. There is no history of Breast cancer.  ROS:   Please see the history of present illness.     All other systems reviewed and are negative.  EKGs/Labs/Other Studies Reviewed:    The following studies were reviewed today:   EKG:  EKG is  ordered today.  The ekg ordered today demonstrates normal sinus rhythm.  Recent Labs: 07/17/2019: Hemoglobin 15.1; Platelets 211.0; TSH 1.74 11/15/2019: ALT 23; BUN 16; Creatinine, Ser 0.71; Potassium 3.6; Sodium 137  Recent Lipid Panel    Component Value Date/Time   CHOL 203 (H) 11/15/2019 0832   TRIG 221.0 (H) 11/15/2019 0832   HDL 51.20 11/15/2019 0832   CHOLHDL 4 11/15/2019 0832   VLDL 44.2 (H) 11/15/2019 0832   LDLCALC 108 (H) 01/16/2018 0849   LDLDIRECT 127.0 11/15/2019 0832    Physical Exam:    VS:  BP 110/80 (BP Location: Right Arm, Patient Position: Sitting, Cuff Size: Large)   Pulse 73   Ht 5\' 6"  (1.676 m)   Wt 203 lb 8 oz (92.3 kg)   LMP 12/26/2012   SpO2 98%   BMI 32.85 kg/m     Wt Readings from Last 3 Encounters:  11/22/19 203 lb 8 oz (92.3 kg)  11/08/19 206 lb (93.4 kg)  07/03/19 209 lb (94.8 kg)     GEN:  Well nourished, well developed in no acute distress HEENT: Normal NECK: No JVD; No carotid bruits LYMPHATICS: No lymphadenopathy CARDIAC: RRR, no murmurs, rubs, gallops RESPIRATORY:  Clear to auscultation without rales, wheezing or rhonchi  ABDOMEN: Soft, non-tender, non-distended MUSCULOSKELETAL:  No edema; No deformity  SKIN: Warm and dry NEUROLOGIC:  Alert and oriented x 3 PSYCHIATRIC:  Normal affect   ASSESSMENT:    1. Dyspnea, unspecified type    PLAN:    In order of problems listed above:  1. Patient with nonspecific dyspnea usually associated with exertion.  She has no cardiac risk factors.  Her symptoms could possibly be from deconditioning.  However cardiac cause is plausible.  Will evaluate with an echocardiogram for any structural abnormalities.  Patient encouraged to undergo graduated  exercise regimen as tolerated.  If on follow-up visit she still is dyspneic and or new symptoms occur, will consider additional testing.  Follow-up after echocardiogram.  This note was generated in part or whole with voice recognition software. Voice recognition is usually quite accurate but there are transcription errors that can and very often do occur. I apologize for any typographical errors that were not detected and corrected.  Medication Adjustments/Labs and Tests Ordered: Current medicines are reviewed at length with the patient today.  Concerns regarding medicines are outlined above.  Orders Placed This Encounter  Procedures  . EKG 12-Lead  . ECHOCARDIOGRAM COMPLETE   No orders of the defined types were placed in this encounter.   Patient Instructions  Medication Instructions:  No changes  *If you need a refill on your cardiac medications before your next appointment, please call your pharmacy*   Lab Work: None  If you have labs (blood work) drawn today and your tests are completely normal, you will receive your results only by: Marland Kitchen MyChart Message (if you have MyChart) OR . A paper copy in the mail If you have any lab test that is abnormal or we need to change your treatment, we will call you to review the results.   Testing/Procedures: Your physician has requested that you have an echocardiogram. Echocardiography is a painless test that uses sound waves to create images of your heart. It provides your doctor with information about the size and shape of your heart and how well your heart's chambers and valves are working. This procedure takes approximately one hour. There are no restrictions for this procedure.    Follow-Up: At [facility], you and your health needs are our priority.  As part of our continuing mission to provide you with exceptional heart care, we have created designated Provider Care Teams.  These Care Teams include your primary Cardiologist  (physician) and Advanced Practice Providers (APPs -  Physician Assistants and Nurse Practitioners) who all work together to provide you with the care you need, when you need it.  We recommend signing up for the patient portal called "MyChart".  Sign up information is provided on this After Visit Summary.  MyChart is used to connect with patients for Virtual Visits (Telemedicine).  Patients are able to view lab/test results, encounter notes, upcoming appointments, etc.  Non-urgent messages can be sent to your provider as well.   To learn more about what you can do with MyChart, go to [email or web address].    Your next appointment:   Follow up after echocardiogram has been done  The format for your next appointment:   In Person  Provider:    You may see Kate Sable, MD or one of the following Advanced Practice Providers on your designated Care Team:    Murray Hodgkins, NP  Christell Faith, PA-C  Marrianne Mood, PA-C      Signed, Kate Sable, MD  11/22/2019 2:08 PM    [place]

## 2019-11-22 NOTE — Patient Instructions (Addendum)
Medication Instructions:  No changes  *If you need a refill on your cardiac medications before your next appointment, please call your pharmacy*   Lab Work: None  If you have labs (blood work) drawn today and your tests are completely normal, you will receive your results only by: Marland Kitchen MyChart Message (if you have MyChart) OR . A paper copy in the mail If you have any lab test that is abnormal or we need to change your treatment, we will call you to review the results.   Testing/Procedures: Your physician has requested that you have an echocardiogram. Echocardiography is a painless test that uses sound waves to create images of your heart. It provides your doctor with information about the size and shape of your heart and how well your heart's chambers and valves are working. This procedure takes approximately one hour. There are no restrictions for this procedure.    Follow-Up: At [facility], you and your health needs are our priority.  As part of our continuing mission to provide you with exceptional heart care, we have created designated Provider Care Teams.  These Care Teams include your primary Cardiologist (physician) and Advanced Practice Providers (APPs -  Physician Assistants and Nurse Practitioners) who all work together to provide you with the care you need, when you need it.  We recommend signing up for the patient portal called "MyChart".  Sign up information is provided on this After Visit Summary.  MyChart is used to connect with patients for Virtual Visits (Telemedicine).  Patients are able to view lab/test results, encounter notes, upcoming appointments, etc.  Non-urgent messages can be sent to your provider as well.   To learn more about what you can do with MyChart, go to [email or web address].    Your next appointment:   Follow up after echocardiogram has been done  The format for your next appointment:   In Person  Provider:    You may see Debbe Odea,  MD or one of the following Advanced Practice Providers on your designated Care Team:    Nicolasa Ducking, NP  Eula Listen, PA-C  Marisue Ivan, PA-C

## 2019-12-10 ENCOUNTER — Ambulatory Visit (INDEPENDENT_AMBULATORY_CARE_PROVIDER_SITE_OTHER): Payer: BC Managed Care – PPO

## 2019-12-10 ENCOUNTER — Other Ambulatory Visit: Payer: Self-pay

## 2019-12-10 DIAGNOSIS — R0600 Dyspnea, unspecified: Secondary | ICD-10-CM

## 2019-12-11 ENCOUNTER — Telehealth: Payer: Self-pay

## 2019-12-11 NOTE — Telephone Encounter (Signed)
Spoke with patient and relayed Dr. Merita Norton result note from her Echo. Patient stated that her symptoms have improved and with a normal echo result she wanted to cancel her follow up apt on 12/14/19. She stated she would like to try exercising more and will call to reschedule if her symptoms (dyspnea on exertion) are unmanageable.

## 2019-12-11 NOTE — Telephone Encounter (Signed)
-----   Message from Debbe Odea, MD sent at 12/11/2019 10:07 AM EDT ----- Normal test. Normal systolic and diastolic function

## 2019-12-14 ENCOUNTER — Ambulatory Visit: Payer: BC Managed Care – PPO | Admitting: Cardiology

## 2020-01-29 ENCOUNTER — Other Ambulatory Visit: Payer: Self-pay | Admitting: Internal Medicine

## 2020-02-04 ENCOUNTER — Encounter: Payer: Self-pay | Admitting: Internal Medicine

## 2020-02-04 ENCOUNTER — Other Ambulatory Visit: Payer: Self-pay

## 2020-02-04 ENCOUNTER — Ambulatory Visit (INDEPENDENT_AMBULATORY_CARE_PROVIDER_SITE_OTHER): Payer: BC Managed Care – PPO

## 2020-02-04 ENCOUNTER — Ambulatory Visit (INDEPENDENT_AMBULATORY_CARE_PROVIDER_SITE_OTHER): Payer: BC Managed Care – PPO | Admitting: Internal Medicine

## 2020-02-04 DIAGNOSIS — E7800 Pure hypercholesterolemia, unspecified: Secondary | ICD-10-CM

## 2020-02-04 DIAGNOSIS — E039 Hypothyroidism, unspecified: Secondary | ICD-10-CM

## 2020-02-04 DIAGNOSIS — F439 Reaction to severe stress, unspecified: Secondary | ICD-10-CM

## 2020-02-04 DIAGNOSIS — R739 Hyperglycemia, unspecified: Secondary | ICD-10-CM

## 2020-02-04 DIAGNOSIS — E559 Vitamin D deficiency, unspecified: Secondary | ICD-10-CM

## 2020-02-04 DIAGNOSIS — M25572 Pain in left ankle and joints of left foot: Secondary | ICD-10-CM

## 2020-02-04 DIAGNOSIS — N6489 Other specified disorders of breast: Secondary | ICD-10-CM

## 2020-02-04 DIAGNOSIS — M25562 Pain in left knee: Secondary | ICD-10-CM

## 2020-02-04 DIAGNOSIS — K219 Gastro-esophageal reflux disease without esophagitis: Secondary | ICD-10-CM

## 2020-02-04 DIAGNOSIS — M25561 Pain in right knee: Secondary | ICD-10-CM

## 2020-02-04 MED ORDER — PANTOPRAZOLE SODIUM 40 MG PO TBEC: 40.0000 mg | DELAYED_RELEASE_TABLET | Freq: Every day | ORAL | 1 refills | Status: DC

## 2020-02-04 NOTE — Progress Notes (Signed)
Patient ID: Carol Jimenez, female   DOB: Jan 10, 1961, 59 y.o.   MRN: [id number]   Subjective:    Patient ID: Carol Jimenez, female    DOB: Nov 19, 1960, 59 y.o.   MRN: [id number]  HPI This visit occurred during the SARS-CoV-2 public health emergency.  Safety protocols were in place, including screening questions prior to the visit, additional usage of staff PPE, and extensive cleaning of exam room while observing appropriate contact time as indicated for disinfecting solutions.  Patient here for a scheduled follow up.  She has seen ortho for her knees - OA.  S/p injections.  Also has had a trial of meloxicam.  Still with increased pain.  Limits activity.  Discussed the need for f/u with ortho to discuss treatment options.  Recently went on vacation.  Tried to do a lot of walking.  Knees limit activity as outlined.  Also had issues with her ankle - purple discoloration.  Resolving.  Legs swelling.  Saw cardiology 11/22/19 - for evaluation sob.  Had ECHO - ok.  Breathing overall stable.  No chest pain.  On protonix.  No abdominal pain.  Bowels stable.    Past Medical History:  Diagnosis Date  . Allergy   . Hx: UTI (urinary tract infection)   . Hypothyroidism    Past Surgical History:  Procedure Laterality Date  . BREAST BIOPSY Left 2015   negative, stereotactic biopsy  . CESAREAN SECTION    . ENDOMETRIAL ABLATION  2000  . KNEE SURGERY Left    Family History  Problem Relation Age of Onset  . Congestive Heart Failure Mother   . Diabetes Mother   . COPD Mother   . Hypertension Father   . Esophageal cancer Father 50       [facility] at [facility]  . Colon polyps Other   . Hypertension Sister   . Hypertension Sister   . Diabetes Sister   . Breast cancer Neg Hx    Social History   Socioeconomic History  . Marital status: Married    Spouse name: Not on file  . Number of children: Not on file  . Years of education: Not on file  . Highest education level: Not on file  Occupational History    . Not on file  Tobacco Use  . Smoking status: Never Smoker  . Smokeless tobacco: Never Used  Vaping Use  . Vaping Use: Never used  Substance and Sexual Activity  . Alcohol use: Yes    Alcohol/week: 0.0 standard drinks    Comment: rare  . Drug use: No  . Sexual activity: Not on file  Other Topics Concern  . Not on file  Social History Narrative  . Not on file   Social Determinants of Health   Financial Resource Strain:   . Difficulty of Paying Living Expenses:   Food Insecurity:   . Worried About Charity fundraiser in the Last Year:   . Arboriculturist in the Last Year:   Transportation Needs:   . Film/video editor (Medical):   Marland Kitchen Lack of Transportation (Non-Medical):   Physical Activity:   . Days of Exercise per Week:   . Minutes of Exercise per Session:   Stress:   . Feeling of Stress :   Social Connections:   . Frequency of Communication with Friends and Family:   . Frequency of Social Gatherings with Friends and Family:   . Attends Religious Services:   . Active Member of Clubs  or Organizations:   . Attends Archivist Meetings:   Marland Kitchen Marital Status:     Outpatient Encounter Medications as of 02/04/2020  Medication Sig  . ARMOUR THYROID 90 MG tablet Take 1 tablet (90 mg total) by mouth daily.  Marland Kitchen estradiol (ESTRACE) 0.5 MG tablet take 1 to 2 tablets by mouth once daily  . hydrochlorothiazide (HYDRODIURIL) 25 MG tablet TAKE 1 TABLET(25 MG) BY MOUTH DAILY  . meloxicam (MOBIC) 7.5 MG tablet Take 7.5 mg by mouth daily as needed.  . pantoprazole (PROTONIX) 40 MG tablet Take 1 tablet (40 mg total) by mouth daily. Take 30 minutes before breakfast  . progesterone (PROMETRIUM) 100 MG capsule TAKE 1 CAPSULE(100 MG) BY MOUTH DAILY  . sertraline (ZOLOFT) 50 MG tablet Take 1 tablet (50 mg total) by mouth daily.  Marland Kitchen tretinoin (RETIN-A) 0.025 % cream Apply 1 application topically as needed.  . [DISCONTINUED] pantoprazole (PROTONIX) 40 MG tablet Take 1 tablet (40 mg  total) by mouth daily. Take 30 minutes before breakfast  . [DISCONTINUED] meloxicam (MOBIC) 15 MG tablet Take 15 mg by mouth daily. (Patient not taking: Reported on 02/04/2020)   No facility-administered encounter medications on file as of 02/04/2020.    Review of Systems  Constitutional: Negative for appetite change and unexpected weight change.  HENT: Negative for congestion and sinus pressure.   Respiratory: Negative for cough, chest tightness and shortness of breath.   Cardiovascular: Negative for chest pain and palpitations.       Some leg swelling as outlined.    Gastrointestinal: Negative for abdominal pain, diarrhea, nausea and vomiting.  Genitourinary: Negative for difficulty urinating and dysuria.  Musculoskeletal: Negative for joint swelling and myalgias.  Skin: Negative for color change and rash.  Neurological: Negative for dizziness, light-headedness and headaches.  Psychiatric/Behavioral: Negative for agitation and dysphoric mood.       Objective:    Physical Exam Vitals reviewed.  Constitutional:      General: She is not in acute distress.    Appearance: Normal appearance.  HENT:     Head: Normocephalic and atraumatic.     Right Ear: External ear normal.     Left Ear: External ear normal.  Eyes:     General: No scleral icterus.       Right eye: No discharge.        Left eye: No discharge.     Conjunctiva/sclera: Conjunctivae normal.  Neck:     Thyroid: No thyromegaly.  Cardiovascular:     Rate and Rhythm: Normal rate and regular rhythm.  Pulmonary:     Effort: No respiratory distress.     Breath sounds: Normal breath sounds. No wheezing.  Abdominal:     General: Bowel sounds are normal.     Palpations: Abdomen is soft.     Tenderness: There is no abdominal tenderness.  Musculoskeletal:        General: No swelling or tenderness.     Cervical back: Neck supple. No tenderness.  Lymphadenopathy:     Cervical: No cervical adenopathy.  Skin:    Findings: No  erythema or rash.  Neurological:     Mental Status: She is alert.  Psychiatric:        Mood and Affect: Mood normal.        Behavior: Behavior normal.     BP 112/66 (BP Location: Left Arm, Patient Position: Sitting, Cuff Size: Large)   Pulse 73   Temp 98.4 F (36.9 C) (Oral)   Resp 16  Ht '5\' 6"'  (1.676 m)   Wt 208 lb 3.2 oz (94.4 kg)   LMP 12/26/2012   SpO2 97%   BMI 33.60 kg/m  Wt Readings from Last 3 Encounters:  02/04/20 208 lb 3.2 oz (94.4 kg)  11/22/19 203 lb 8 oz (92.3 kg)  11/08/19 206 lb (93.4 kg)     Lab Results  Component Value Date   WBC 6.6 07/17/2019   HGB 15.1 (H) 07/17/2019   HCT 44.3 07/17/2019   PLT 211.0 07/17/2019   GLUCOSE 109 (H) 11/15/2019   CHOL 203 (H) 11/15/2019   TRIG 221.0 (H) 11/15/2019   HDL 51.20 11/15/2019   LDLDIRECT 127.0 11/15/2019   LDLCALC 108 (H) 01/16/2018   ALT 23 11/15/2019   AST 20 11/15/2019   NA 137 11/15/2019   K 3.6 11/15/2019   CL 101 11/15/2019   CREATININE 0.71 11/15/2019   BUN 16 11/15/2019   CO2 30 11/15/2019   TSH 1.74 07/17/2019   HGBA1C 5.6 11/15/2019    MM 3D SCREEN BREAST BILATERAL  Result Date: 08/14/2019 CLINICAL DATA:  Screening. EXAM: DIGITAL SCREENING BILATERAL MAMMOGRAM WITH TOMO AND CAD COMPARISON:  Previous exam(s). ACR Breast Density Category b: There are scattered areas of fibroglandular density. FINDINGS: There are no findings suspicious for malignancy. Images were processed with CAD. IMPRESSION: No mammographic evidence of malignancy. A result letter of this screening mammogram will be mailed directly to the patient. RECOMMENDATION: Screening mammogram in one year. (Code:SM-B-01Y) BI-RADS CATEGORY  1: Negative. Electronically Signed   By: Ammie Ferrier M.D.   On: 08/14/2019 16:22       Assessment & Plan:   Problem List Items Addressed This Visit    Vitamin D deficiency    Follow vitamin D level.       Stress    Increased stress.  On zoloft.  Was questioning if contributing to some  of her current symptoms - hot flash, etc. Discussed a trial of lower dose zoloft - 17m.  Follow.        Pseudoangiomatous stromal hyperplasia of breast    Previously saw Dr BBary Castilla  Mammogram 08/14/19 - birads I.        Left ankle pain   Relevant Orders   DG Ankle Complete Left (Completed)   Knee pain, bilateral    S/p injection, trial of antiinflammatories.  Still with increased pain and limitations with activity.  Refer back to ortho for further evaluation and question of other treatment options.  Pt will call back with name of orthopedist wants to see.        Hypothyroid    On thyroid replacement.  Follow tsh.       Hyperglycemia    Low carb diet and exercise.  Follow met b and a1c.       Relevant Orders   Hemoglobin AO3Z  Basic metabolic panel   Hypercholesterolemia    Low cholesterol diet and exercise.  Follow lipid panel.       Relevant Orders   Hepatic function panel   Lipid panel   Acid reflux    Protonox.  Follow.       Relevant Medications   pantoprazole (PROTONIX) 40 MG tablet       CEinar Pheasant MD

## 2020-02-10 ENCOUNTER — Encounter: Payer: Self-pay | Admitting: Internal Medicine

## 2020-02-10 NOTE — Assessment & Plan Note (Signed)
On thyroid replacement.  Follow tsh.  

## 2020-02-10 NOTE — Assessment & Plan Note (Signed)
Low carb diet and exercise.  Follow met b and a1c.  

## 2020-02-10 NOTE — Assessment & Plan Note (Signed)
Protonox.  Follow.

## 2020-02-10 NOTE — Assessment & Plan Note (Signed)
Previously saw Dr Lemar Livings.  Mammogram 08/14/19 - birads I.

## 2020-02-10 NOTE — Assessment & Plan Note (Signed)
Increased stress.  On zoloft.  Was questioning if contributing to some of her current symptoms - hot flash, etc. Discussed a trial of lower dose zoloft - 25mg .  Follow.

## 2020-02-10 NOTE — Assessment & Plan Note (Signed)
Low cholesterol diet and exercise.  Follow lipid panel.   

## 2020-02-10 NOTE — Assessment & Plan Note (Signed)
Follow vitamin D level.  

## 2020-02-10 NOTE — Assessment & Plan Note (Signed)
S/p injection, trial of antiinflammatories.  Still with increased pain and limitations with activity.  Refer back to ortho for further evaluation and question of other treatment options.  Pt will call back with name of orthopedist wants to see.

## 2020-02-11 ENCOUNTER — Encounter: Payer: Self-pay | Admitting: Internal Medicine

## 2020-02-11 DIAGNOSIS — M25561 Pain in right knee: Secondary | ICD-10-CM

## 2020-02-12 NOTE — Telephone Encounter (Signed)
Order placed for ortho referral.   

## 2020-02-21 ENCOUNTER — Other Ambulatory Visit: Payer: Self-pay | Admitting: Internal Medicine

## 2020-02-26 ENCOUNTER — Other Ambulatory Visit: Payer: Self-pay | Admitting: Internal Medicine

## 2020-02-28 ENCOUNTER — Encounter: Payer: Self-pay | Admitting: Internal Medicine

## 2020-02-29 ENCOUNTER — Other Ambulatory Visit: Payer: Self-pay | Admitting: Internal Medicine

## 2020-03-11 ENCOUNTER — Telehealth: Payer: BC Managed Care – PPO | Admitting: Physician Assistant

## 2020-03-11 DIAGNOSIS — N3000 Acute cystitis without hematuria: Secondary | ICD-10-CM | POA: Diagnosis not present

## 2020-03-11 MED ORDER — CEPHALEXIN 500 MG PO CAPS: 500.0000 mg | ORAL_CAPSULE | Freq: Two times a day (BID) | ORAL | 0 refills | Status: AC

## 2020-03-11 NOTE — Progress Notes (Signed)

## 2020-03-13 ENCOUNTER — Telehealth: Payer: Self-pay | Admitting: *Deleted

## 2020-03-13 NOTE — Telephone Encounter (Signed)
Error see my chart message

## 2020-03-14 NOTE — Telephone Encounter (Signed)
lvm to make Son an appt

## 2020-03-17 ENCOUNTER — Other Ambulatory Visit: Payer: Self-pay

## 2020-03-17 ENCOUNTER — Other Ambulatory Visit (INDEPENDENT_AMBULATORY_CARE_PROVIDER_SITE_OTHER): Payer: BC Managed Care – PPO

## 2020-03-17 DIAGNOSIS — E7800 Pure hypercholesterolemia, unspecified: Secondary | ICD-10-CM

## 2020-03-17 DIAGNOSIS — R739 Hyperglycemia, unspecified: Secondary | ICD-10-CM

## 2020-03-17 LAB — HEPATIC FUNCTION PANEL
ALT: 33 U/L (ref 0–35)
AST: 33 U/L (ref 0–37)
Albumin: 4.2 g/dL (ref 3.5–5.2)
Alkaline Phosphatase: 68 U/L (ref 39–117)
Bilirubin, Direct: 0.1 mg/dL (ref 0.0–0.3)
Total Bilirubin: 0.5 mg/dL (ref 0.2–1.2)
Total Protein: 6.7 g/dL (ref 6.0–8.3)

## 2020-03-17 LAB — LIPID PANEL
Cholesterol: 190 mg/dL (ref 0–200)
HDL: 45.9 mg/dL (ref >39.00)
LDL Cholesterol: 108 mg/dL — ABNORMAL HIGH (ref 0–99)
NonHDL: 144.11
Total CHOL/HDL Ratio: 4
Triglycerides: 181 mg/dL — ABNORMAL HIGH (ref 0.0–149.0)
VLDL: 36.2 mg/dL (ref 0.0–40.0)

## 2020-03-17 LAB — BASIC METABOLIC PANEL
BUN: 15 mg/dL (ref 6–23)
CO2: 28 mEq/L (ref 19–32)
Calcium: 9 mg/dL (ref 8.4–10.5)
Chloride: 101 mEq/L (ref 96–112)
Creatinine, Ser: 0.68 mg/dL (ref 0.40–1.20)
GFR: 88.25 mL/min (ref >60.00)
Glucose, Bld: 119 mg/dL — ABNORMAL HIGH (ref 70–99)
Potassium: 3.9 mEq/L (ref 3.5–5.1)
Sodium: 138 mEq/L (ref 135–145)

## 2020-03-17 LAB — HEMOGLOBIN A1C: Hgb A1c MFr Bld: 6 % (ref 4.6–6.5)

## 2020-04-11 ENCOUNTER — Ambulatory Visit: Payer: BC Managed Care – PPO | Admitting: Internal Medicine

## 2020-04-17 ENCOUNTER — Telehealth: Payer: Self-pay | Admitting: Internal Medicine

## 2020-04-17 NOTE — Telephone Encounter (Signed)
Ok Thank you so much for getting that information.

## 2020-04-17 NOTE — Telephone Encounter (Signed)
Patient returned referrals call. Patient has been seen in the [place] office with Dr. Odis Luster on 02/25/2020 and has a follow up with Dr. Odis Luster on 05/06/20.

## 2020-04-17 NOTE — Telephone Encounter (Signed)
lft vm for pt to call ofc to confirm referral appt with [facility].

## 2020-04-29 ENCOUNTER — Other Ambulatory Visit: Payer: Self-pay

## 2020-04-29 ENCOUNTER — Encounter: Payer: Self-pay | Admitting: Internal Medicine

## 2020-04-29 ENCOUNTER — Ambulatory Visit: Payer: BC Managed Care – PPO | Admitting: Internal Medicine

## 2020-04-29 VITALS — BP 120/72 | HR 80 | Temp 98.2°F | Resp 16 | Ht 66.0 in | Wt 202.0 lb

## 2020-04-29 DIAGNOSIS — F439 Reaction to severe stress, unspecified: Secondary | ICD-10-CM

## 2020-04-29 DIAGNOSIS — Z23 Encounter for immunization: Secondary | ICD-10-CM | POA: Diagnosis not present

## 2020-04-29 DIAGNOSIS — R739 Hyperglycemia, unspecified: Secondary | ICD-10-CM | POA: Diagnosis not present

## 2020-04-29 DIAGNOSIS — M25561 Pain in right knee: Secondary | ICD-10-CM

## 2020-04-29 DIAGNOSIS — M25562 Pain in left knee: Secondary | ICD-10-CM

## 2020-04-29 DIAGNOSIS — N898 Other specified noninflammatory disorders of vagina: Secondary | ICD-10-CM

## 2020-04-29 DIAGNOSIS — R7989 Other specified abnormal findings of blood chemistry: Secondary | ICD-10-CM

## 2020-04-29 DIAGNOSIS — R945 Abnormal results of liver function studies: Secondary | ICD-10-CM

## 2020-04-29 DIAGNOSIS — E039 Hypothyroidism, unspecified: Secondary | ICD-10-CM | POA: Diagnosis not present

## 2020-04-29 DIAGNOSIS — E7800 Pure hypercholesterolemia, unspecified: Secondary | ICD-10-CM | POA: Diagnosis not present

## 2020-04-29 MED ORDER — ESTRADIOL 0.1 MG/GM VA CREA: TOPICAL_CREAM | VAGINAL | 1 refills | Status: DC

## 2020-04-29 NOTE — Progress Notes (Signed)
Patient ID: Carol Jimenez, female   DOB: May 10, 1961, 59 y.o.   MRN: [id number]   Subjective:    Patient ID: Carol Jimenez, female    DOB: Sep 19, 1960, 59 y.o.   MRN: [id number]  HPI This visit occurred during the SARS-CoV-2 public health emergency.  Safety protocols were in place, including screening questions prior to the visit, additional usage of staff PPE, and extensive cleaning of exam room while observing appropriate contact time as indicated for disinfecting solutions.  Patient here for a scheduled follow up.  She report she is doing relatively well. Working from home.  Increased stress related to this.  Tries to stay active, but is limited secondary to her knees. Has been going to PT.  Exercises do help, but if she is up on her feet more - increased problems.  Has f/u with ortho next week to discuss "gel injections".  No chest pain or sob reported.  No abdominal pain or bowel change reported.  Some increased vaginal dryness.  Would like to try estrogen cream.  Takes 1/2 tablet of oral estrogen.  On zoloft.  Taking one whole tablet per day.  Feels levels things out.  Discussed shingles vaccine, covid booster and flu vaccine.     Past Medical History:  Diagnosis Date  . Allergy   . Hx: UTI (urinary tract infection)   . Hypothyroidism    Past Surgical History:  Procedure Laterality Date  . BREAST BIOPSY Left 2015   negative, stereotactic biopsy  . CESAREAN SECTION    . ENDOMETRIAL ABLATION  2000  . KNEE SURGERY Left    Family History  Problem Relation Age of Onset  . Congestive Heart Failure Mother   . Diabetes Mother   . COPD Mother   . Hypertension Father   . Esophageal cancer Father 59       treated at [facility]  . Colon polyps Other   . Hypertension Sister   . Hypertension Sister   . Diabetes Sister   . Breast cancer Neg Hx    Social History   Socioeconomic History  . Marital status: Married    Spouse name: Not on file  . Number of children: Not on file  . Years of  education: Not on file  . Highest education level: Not on file  Occupational History  . Not on file  Tobacco Use  . Smoking status: Never Smoker  . Smokeless tobacco: Never Used  Vaping Use  . Vaping Use: Never used  Substance and Sexual Activity  . Alcohol use: Yes    Alcohol/week: 0.0 standard drinks    Comment: rare  . Drug use: No  . Sexual activity: Not on file  Other Topics Concern  . Not on file  Social History Narrative  . Not on file   Social Determinants of Health   Financial Resource Strain:   . Difficulty of Paying Living Expenses: Not on file  Food Insecurity:   . Worried About Charity fundraiser in the Last Year: Not on file  . Ran Out of Food in the Last Year: Not on file  Transportation Needs:   . Lack of Transportation (Medical): Not on file  . Lack of Transportation (Non-Medical): Not on file  Physical Activity:   . Days of Exercise per Week: Not on file  . Minutes of Exercise per Session: Not on file  Stress:   . Feeling of Stress : Not on file  Social Connections:   . Frequency of  Communication with Friends and Family: Not on file  . Frequency of Social Gatherings with Friends and Family: Not on file  . Attends Religious Services: Not on file  . Active Member of Clubs or Organizations: Not on file  . Attends Archivist Meetings: Not on file  . Marital Status: Not on file    Outpatient Encounter Medications as of 04/29/2020  Medication Sig  . ARMOUR THYROID 90 MG tablet TAKE 1 TABLET(90 MG) BY MOUTH DAILY  . estradiol (ESTRACE VAGINAL) 0.1 MG/GM vaginal cream One applicator q hs for 5 days and then 2x/week.  . estradiol (ESTRACE) 0.5 MG tablet take 1 to 2 tablets by mouth once daily  . hydrochlorothiazide (HYDRODIURIL) 25 MG tablet TAKE 1 TABLET(25 MG) BY MOUTH DAILY  . meloxicam (MOBIC) 7.5 MG tablet Take 7.5 mg by mouth daily as needed.  . pantoprazole (PROTONIX) 40 MG tablet Take 1 tablet (40 mg total) by mouth daily. Take 30 minutes  before breakfast  . progesterone (PROMETRIUM) 100 MG capsule TAKE 1 CAPSULE(100 MG) BY MOUTH DAILY  . sertraline (ZOLOFT) 50 MG tablet TAKE 1 TABLET(50 MG) BY MOUTH DAILY  . tretinoin (RETIN-A) 0.025 % cream Apply 1 application topically as needed.   No facility-administered encounter medications on file as of 04/29/2020.    Review of Systems  Constitutional: Negative for appetite change and unexpected weight change.  HENT: Negative for congestion and sinus pressure.   Respiratory: Negative for cough, chest tightness and shortness of breath.   Cardiovascular: Negative for chest pain, palpitations and leg swelling.  Gastrointestinal: Negative for abdominal pain, diarrhea, nausea and vomiting.  Genitourinary: Negative for difficulty urinating and dysuria.       Vaginal dryness.   Musculoskeletal: Negative for myalgias.       Knee pain as outlined.   Skin: Negative for color change and rash.  Neurological: Negative for dizziness, light-headedness and headaches.  Psychiatric/Behavioral: Negative for agitation and dysphoric mood.       Objective:    Physical Exam Vitals reviewed.  Constitutional:      General: She is not in acute distress.    Appearance: Normal appearance.  HENT:     Head: Normocephalic and atraumatic.     Right Ear: External ear normal.     Left Ear: External ear normal.  Eyes:     General: No scleral icterus.       Right eye: No discharge.        Left eye: No discharge.     Conjunctiva/sclera: Conjunctivae normal.  Neck:     Thyroid: No thyromegaly.  Cardiovascular:     Rate and Rhythm: Normal rate and regular rhythm.  Pulmonary:     Effort: No respiratory distress.     Breath sounds: Normal breath sounds. No wheezing.  Abdominal:     General: Bowel sounds are normal.     Palpations: Abdomen is soft.     Tenderness: There is no abdominal tenderness.  Musculoskeletal:        General: No swelling or tenderness.     Cervical back: Neck supple. No  tenderness.  Lymphadenopathy:     Cervical: No cervical adenopathy.  Skin:    Findings: No erythema or rash.  Neurological:     Mental Status: She is alert.  Psychiatric:        Mood and Affect: Mood normal.        Behavior: Behavior normal.     BP 120/72   Pulse 80  Temp 98.2 F (36.8 C) (Oral)   Resp 16   Ht '5\' 6"'  (1.676 m)   Wt 202 lb (91.6 kg)   LMP 12/26/2012   SpO2 98%   BMI 32.60 kg/m  Wt Readings from Last 3 Encounters:  04/29/20 202 lb (91.6 kg)  02/04/20 208 lb 3.2 oz (94.4 kg)  11/22/19 203 lb 8 oz (92.3 kg)     Lab Results  Component Value Date   WBC 6.6 07/17/2019   HGB 15.1 (H) 07/17/2019   HCT 44.3 07/17/2019   PLT 211.0 07/17/2019   GLUCOSE 119 (H) 03/17/2020   CHOL 190 03/17/2020   TRIG 181.0 (H) 03/17/2020   HDL 45.90 03/17/2020   LDLDIRECT 127.0 11/15/2019   LDLCALC 108 (H) 03/17/2020   ALT 33 03/17/2020   AST 33 03/17/2020   NA 138 03/17/2020   K 3.9 03/17/2020   CL 101 03/17/2020   CREATININE 0.68 03/17/2020   BUN 15 03/17/2020   CO2 28 03/17/2020   TSH 1.74 07/17/2019   HGBA1C 6.0 03/17/2020    MM 3D SCREEN BREAST BILATERAL  Result Date: 08/14/2019 CLINICAL DATA:  Screening. EXAM: DIGITAL SCREENING BILATERAL MAMMOGRAM WITH TOMO AND CAD COMPARISON:  Previous exam(s). ACR Breast Density Category b: There are scattered areas of fibroglandular density. FINDINGS: There are no findings suspicious for malignancy. Images were processed with CAD. IMPRESSION: No mammographic evidence of malignancy. A result letter of this screening mammogram will be mailed directly to the patient. RECOMMENDATION: Screening mammogram in one year. (Code:SM-B-01Y) BI-RADS CATEGORY  1: Negative. Electronically Signed   By: Ammie Ferrier M.D.   On: 08/14/2019 16:22       Assessment & Plan:   Problem List Items Addressed This Visit    Vaginal dryness    Estrace cream as directed.  Follow.        Stress    On zoloft.  Feels is helping.  Continue.        Knee pain, bilateral    S/ injection and trial of antiinflammatories.  Still with increased pain.  Limits her activity. Planning to f/u with ortho next week to discuss gel injections.  Follow.        Hypothyroid    On thyroid replacement.  Follow tsh.       Relevant Orders   TSH   Hyperglycemia    Low carb diet and exercise.  Follow met b and a1c.        Relevant Orders   Hemoglobin A1c   Hypercholesterolemia    The 10-year ASCVD risk score Mikey Bussing DC Jr., et al., 2013) is: 3.2%   Values used to calculate the score:     Age: 20 years     Sex: Female     Is Non-Hispanic African American: No     Diabetic: No     Tobacco smoker: No     Systolic Blood Pressure: 415 mmHg     Is BP treated: No     HDL Cholesterol: 45.9 mg/dL     Total Cholesterol: 190 mg/dL  Low cholesterol diet and exercise.  Follow lipid panel.       Relevant Orders   CBC with Differential/Platelet   Lipid panel   Basic metabolic panel   Abnormal liver function tests    Previous ultrasound revealed fatty liver.  Low cholesterol diet and exercise.  Follow lipid panel.       Relevant Orders   Hepatic function panel    Other Visit Diagnoses  Need for immunization against influenza    -  Primary   Relevant Orders   Flu Vaccine QUAD 36+ mos IM (Completed)       Einar Pheasant, MD

## 2020-05-05 ENCOUNTER — Encounter: Payer: Self-pay | Admitting: Internal Medicine

## 2020-05-05 DIAGNOSIS — N898 Other specified noninflammatory disorders of vagina: Secondary | ICD-10-CM | POA: Insufficient documentation

## 2020-05-05 NOTE — Assessment & Plan Note (Signed)
Low carb diet and exercise.  Follow met b and a1c.   

## 2020-05-05 NOTE — Assessment & Plan Note (Signed)
On thyroid replacement.  Follow tsh.  

## 2020-05-05 NOTE — Assessment & Plan Note (Signed)
Previous ultrasound revealed fatty liver.  Low cholesterol diet and exercise.  Follow lipid panel.

## 2020-05-05 NOTE — Assessment & Plan Note (Signed)
On zoloft.  Feels is helping.  Continue.

## 2020-05-05 NOTE — Assessment & Plan Note (Signed)
Estrace cream as directed.  Follow.  

## 2020-05-05 NOTE — Assessment & Plan Note (Signed)
The 10-year ASCVD risk score Denman George DC Montez Hageman., et al., 2013) is: 3.2%   Values used to calculate the score:     Age: 59 years     Sex: Female     Is Non-Hispanic African American: No     Diabetic: No     Tobacco smoker: No     Systolic Blood Pressure: 120 mmHg     Is BP treated: No     HDL Cholesterol: 45.9 mg/dL     Total Cholesterol: 190 mg/dL  Low cholesterol diet and exercise.  Follow lipid panel.

## 2020-05-05 NOTE — Assessment & Plan Note (Signed)
S/ injection and trial of antiinflammatories.  Still with increased pain.  Limits her activity. Planning to f/u with ortho next week to discuss gel injections.  Follow.

## 2020-05-23 ENCOUNTER — Other Ambulatory Visit: Payer: Self-pay | Admitting: Internal Medicine

## 2020-05-26 ENCOUNTER — Other Ambulatory Visit: Payer: Self-pay | Admitting: Internal Medicine

## 2020-06-12 ENCOUNTER — Encounter: Payer: Self-pay | Admitting: Internal Medicine

## 2020-06-17 ENCOUNTER — Other Ambulatory Visit: Payer: Self-pay

## 2020-06-17 MED ORDER — ESTRADIOL 0.1 MG/GM VA CREA
TOPICAL_CREAM | VAGINAL | 1 refills | Status: DC
Start: 2020-06-17 — End: 2020-10-08

## 2020-06-25 ENCOUNTER — Other Ambulatory Visit: Payer: BC Managed Care – PPO

## 2020-06-25 DIAGNOSIS — Z20822 Contact with and (suspected) exposure to covid-19: Secondary | ICD-10-CM

## 2020-06-26 LAB — NOVEL CORONAVIRUS, NAA: SARS-CoV-2, NAA: NOT DETECTED

## 2020-06-26 LAB — SARS-COV-2, NAA 2 DAY TAT

## 2020-06-27 ENCOUNTER — Telehealth: Payer: BC Managed Care – PPO | Admitting: Physician Assistant

## 2020-06-27 DIAGNOSIS — J069 Acute upper respiratory infection, unspecified: Secondary | ICD-10-CM

## 2020-06-27 MED ORDER — BENZONATATE 100 MG PO CAPS: 100.0000 mg | ORAL_CAPSULE | Freq: Three times a day (TID) | ORAL | 0 refills | Status: DC | PRN

## 2020-06-27 MED ORDER — FLUTICASONE PROPIONATE 50 MCG/ACT NA SUSP: 2.0000 | Freq: Every day | NASAL | 0 refills | Status: DC

## 2020-06-27 NOTE — Progress Notes (Signed)
We are sorry you are not feeling well.  Here is how we plan to help!  Your COVID-19 send out test is still pending.  In the meantime, we are going to  help treat your symptoms.  Based on what you have shared with me, it looks like you may have a viral upper respiratory infection.  Upper respiratory infections are caused by a large number of viruses; however, rhinovirus is the most common cause.   Symptoms vary from person to person, with common symptoms including sore throat, cough, fatigue or lack of energy and feeling of general discomfort.  A low-grade fever of up to 100.4 may present, but is often uncommon.  Symptoms vary however, and are closely related to a person's age or underlying illnesses.  The most common symptoms associated with an upper respiratory infection are nasal discharge or congestion, cough, sneezing, headache and pressure in the ears and face.  These symptoms usually persist for about 3 to 10 days, but can last up to 2 weeks.  It is important to know that upper respiratory infections do not cause serious illness or complications in most cases.    Upper respiratory infections can be transmitted from person to person, with the most common method of transmission being a person's hands.  The virus is able to live on the skin and can infect other persons for up to 2 hours after direct contact.  Also, these can be transmitted when someone coughs or sneezes; thus, it is important to cover the mouth to reduce this risk.  To keep the spread of the illness at bay, good hand hygiene is very important.  This is an infection that is most likely caused by a virus. There are no specific treatments other than to help you with the symptoms until the infection runs its course.  We are sorry you are not feeling well.  Here is how we plan to help!   For nasal congestion, you may use an oral decongestants such as Mucinex D or if you have glaucoma or high blood pressure use plain Mucinex.  Saline nasal  spray or nasal drops can help and can safely be used as often as needed for congestion.  For your congestion, I have prescribed Fluticasone nasal spray one spray in each nostril twice a day  If you do not have a history of heart disease, hypertension, diabetes or thyroid disease, prostate/bladder issues or glaucoma, you may also use Sudafed to treat nasal congestion.  It is highly recommended that you consult with a pharmacist or your primary care physician to ensure this medication is safe for you to take.     If you have a cough, you may use cough suppressants such as Delsym and Robitussin.  If you have glaucoma or high blood pressure, you can also use Coricidin HBP.   For cough I have prescribed for you A prescription cough medication called Tessalon Perles 100 mg. You may take 1-2 capsules every 8 hours as needed for cough  If you have a sore or scratchy throat, use a saltwater gargle-  to  teaspoon of salt dissolved in a 4-ounce to 8-ounce glass of warm water.  Gargle the solution for approximately 15-30 seconds and then spit.  It is important not to swallow the solution.  You can also use throat lozenges/cough drops and Chloraseptic spray to help with throat pain or discomfort.  Warm or cold liquids can also be helpful in relieving throat pain.  For headache, pain or  general discomfort, you can use Ibuprofen or Tylenol as directed.   Some authorities believe that zinc sprays or the use of Echinacea may shorten the course of your symptoms.   HOME CARE . Only take medications as instructed by your medical team. . Be sure to drink plenty of fluids. Water is fine as well as fruit juices, sodas and electrolyte beverages. You may want to stay away from caffeine or alcohol. If you are nauseated, try taking small sips of liquids. How do you know if you are getting enough fluid? Your urine should be a pale yellow or almost colorless. . Get rest. . Taking a steamy shower or using a humidifier may help  nasal congestion and ease sore throat pain. You can place a towel over your head and breathe in the steam from hot water coming from a faucet. . Using a saline nasal spray works much the same way. . Cough drops, hard candies and sore throat lozenges may ease your cough. . Avoid close contacts especially the very young and the elderly . Cover your mouth if you cough or sneeze . Always remember to wash your hands.   GET HELP RIGHT AWAY IF: . You develop worsening fever. . If your symptoms do not improve within 10 days . You develop yellow or green discharge from your nose over 3 days. . You have coughing fits . You develop a severe head ache or visual changes. . You develop shortness of breath, difficulty breathing or start having chest pain . Your symptoms persist after you have completed your treatment plan  MAKE SURE YOU   Understand these instructions.  Will watch your condition.  Will get help right away if you are not doing well or get worse.  Your e-visit answers were reviewed by a board certified advanced clinical practitioner to complete your personal care plan. Depending upon the condition, your plan could have included both over the counter or prescription medications. Please review your pharmacy choice. If there is a problem, you may call our nursing hot line at and have the prescription routed to another pharmacy. Your safety is important to [place]. If you have drug allergies check your prescription carefully.   You can use MyChart to ask questions about today's visit, request a non-urgent call back, or ask for a work or school excuse for 24 hours related to this e-Visit. If it has been greater than 24 hours you will need to follow up with your provider, or enter a new e-Visit to address those concerns. You will get an e-mail in the next two days asking about your experience.  I hope that your e-visit has been valuable and will speed your recovery. Thank you for using  e-visits.     Greater than 5 minutes, yet less than 10 minutes of time have been spent researching, coordinating, and implementing care for this patient today

## 2020-07-03 ENCOUNTER — Other Ambulatory Visit: Payer: BC Managed Care – PPO

## 2020-07-03 ENCOUNTER — Other Ambulatory Visit: Payer: Self-pay

## 2020-07-03 DIAGNOSIS — Z20822 Contact with and (suspected) exposure to covid-19: Secondary | ICD-10-CM

## 2020-07-08 LAB — NOVEL CORONAVIRUS, NAA: SARS-CoV-2, NAA: DETECTED — AB

## 2020-07-26 ENCOUNTER — Other Ambulatory Visit: Payer: Self-pay | Admitting: Internal Medicine

## 2020-07-29 ENCOUNTER — Other Ambulatory Visit: Payer: Self-pay

## 2020-07-29 ENCOUNTER — Other Ambulatory Visit (INDEPENDENT_AMBULATORY_CARE_PROVIDER_SITE_OTHER): Payer: BC Managed Care – PPO

## 2020-07-29 DIAGNOSIS — R739 Hyperglycemia, unspecified: Secondary | ICD-10-CM

## 2020-07-29 DIAGNOSIS — E7800 Pure hypercholesterolemia, unspecified: Secondary | ICD-10-CM

## 2020-07-29 DIAGNOSIS — R7989 Other specified abnormal findings of blood chemistry: Secondary | ICD-10-CM

## 2020-07-29 DIAGNOSIS — R945 Abnormal results of liver function studies: Secondary | ICD-10-CM | POA: Diagnosis not present

## 2020-07-29 DIAGNOSIS — E039 Hypothyroidism, unspecified: Secondary | ICD-10-CM

## 2020-07-29 LAB — CBC WITH DIFFERENTIAL/PLATELET
Basophils Absolute: 0 10*3/uL (ref 0.0–0.1)
Basophils Relative: 0.7 % (ref 0.0–3.0)
Eosinophils Absolute: 0.1 10*3/uL (ref 0.0–0.7)
Eosinophils Relative: 1.3 % (ref 0.0–5.0)
HCT: 44.1 % (ref 36.0–46.0)
Hemoglobin: 14.9 g/dL (ref 12.0–15.0)
Lymphocytes Relative: 20.5 % (ref 12.0–46.0)
Lymphs Abs: 1.4 10*3/uL (ref 0.7–4.0)
MCHC: 33.7 g/dL (ref 30.0–36.0)
MCV: 93.5 fl (ref 78.0–100.0)
Monocytes Absolute: 0.4 10*3/uL (ref 0.1–1.0)
Monocytes Relative: 6.5 % (ref 3.0–12.0)
Neutro Abs: 4.7 10*3/uL (ref 1.4–7.7)
Neutrophils Relative %: 71 % (ref 43.0–77.0)
Platelets: 187 10*3/uL (ref 150.0–400.0)
RBC: 4.72 Mil/uL (ref 3.87–5.11)
RDW: 13.7 % (ref 11.5–15.5)
WBC: 6.6 10*3/uL (ref 4.0–10.5)

## 2020-07-29 LAB — HEPATIC FUNCTION PANEL
ALT: 27 U/L (ref 0–35)
AST: 25 U/L (ref 0–37)
Albumin: 4.1 g/dL (ref 3.5–5.2)
Alkaline Phosphatase: 63 U/L (ref 39–117)
Bilirubin, Direct: 0.1 mg/dL (ref 0.0–0.3)
Total Bilirubin: 0.6 mg/dL (ref 0.2–1.2)
Total Protein: 6.7 g/dL (ref 6.0–8.3)

## 2020-07-29 LAB — BASIC METABOLIC PANEL
BUN: 15 mg/dL (ref 6–23)
CO2: 29 mEq/L (ref 19–32)
Calcium: 9.3 mg/dL (ref 8.4–10.5)
Chloride: 102 mEq/L (ref 96–112)
Creatinine, Ser: 0.63 mg/dL (ref 0.40–1.20)
GFR: 96.43 mL/min (ref >60.00)
Glucose, Bld: 109 mg/dL — ABNORMAL HIGH (ref 70–99)
Potassium: 3.8 mEq/L (ref 3.5–5.1)
Sodium: 138 mEq/L (ref 135–145)

## 2020-07-29 LAB — LIPID PANEL
Cholesterol: 224 mg/dL — ABNORMAL HIGH (ref 0–200)
HDL: 51.8 mg/dL (ref >39.00)
NonHDL: 172.32
Total CHOL/HDL Ratio: 4
Triglycerides: 201 mg/dL — ABNORMAL HIGH (ref 0.0–149.0)
VLDL: 40.2 mg/dL — ABNORMAL HIGH (ref 0.0–40.0)

## 2020-07-29 LAB — TSH: TSH: 3.67 u[IU]/mL (ref 0.35–4.50)

## 2020-07-29 LAB — LDL CHOLESTEROL, DIRECT: Direct LDL: 156 mg/dL

## 2020-07-29 LAB — HEMOGLOBIN A1C: Hgb A1c MFr Bld: 6 % (ref 4.6–6.5)

## 2020-07-31 ENCOUNTER — Ambulatory Visit (INDEPENDENT_AMBULATORY_CARE_PROVIDER_SITE_OTHER): Payer: BC Managed Care – PPO | Admitting: Internal Medicine

## 2020-07-31 ENCOUNTER — Other Ambulatory Visit: Payer: Self-pay

## 2020-07-31 VITALS — BP 118/70 | HR 77 | Temp 98.5°F | Resp 16 | Ht 66.0 in | Wt 201.0 lb

## 2020-07-31 DIAGNOSIS — Z1231 Encounter for screening mammogram for malignant neoplasm of breast: Secondary | ICD-10-CM

## 2020-07-31 DIAGNOSIS — Z0000 Encounter for general adult medical examination without abnormal findings: Secondary | ICD-10-CM

## 2020-07-31 DIAGNOSIS — F439 Reaction to severe stress, unspecified: Secondary | ICD-10-CM

## 2020-07-31 DIAGNOSIS — R945 Abnormal results of liver function studies: Secondary | ICD-10-CM

## 2020-07-31 DIAGNOSIS — E039 Hypothyroidism, unspecified: Secondary | ICD-10-CM

## 2020-07-31 DIAGNOSIS — M25562 Pain in left knee: Secondary | ICD-10-CM

## 2020-07-31 DIAGNOSIS — M25561 Pain in right knee: Secondary | ICD-10-CM

## 2020-07-31 DIAGNOSIS — R739 Hyperglycemia, unspecified: Secondary | ICD-10-CM

## 2020-07-31 DIAGNOSIS — R7989 Other specified abnormal findings of blood chemistry: Secondary | ICD-10-CM

## 2020-07-31 DIAGNOSIS — E7800 Pure hypercholesterolemia, unspecified: Secondary | ICD-10-CM

## 2020-07-31 NOTE — Progress Notes (Signed)
Patient ID: Carol Jimenez, female   DOB: Jul 03, 1960, 60 y.o.   MRN: [id number]   Subjective:    Patient ID: Carol Jimenez, female    DOB: 08/18/1960, 60 y.o.   MRN: [id number]  HPI This visit occurred during the SARS-CoV-2 public health emergency.  Safety protocols were in place, including screening questions prior to the visit, additional usage of staff PPE, and extensive cleaning of exam room while observing appropriate contact time as indicated for disinfecting solutions.  Patient here for her physical exam.  She reports she is doing relatively well.  Tries to stay active. Is limited by her knees.  Seeing Dr Harlow Mares.  S/p injection.  Did help some. Insurance denies gel injections (per pt).  No chest pain or sob reported.  No abdominal pain or bowel change reported.  No nausea or vomiting.  Discussed labs.  Discussed low carb diet and exercise.    Past Medical History:  Diagnosis Date  . Allergy   . Hx: UTI (urinary tract infection)   . Hypothyroidism    Past Surgical History:  Procedure Laterality Date  . BREAST BIOPSY Left 2015   negative, stereotactic biopsy  . CESAREAN SECTION    . ENDOMETRIAL ABLATION  2000  . KNEE SURGERY Left    Family History  Problem Relation Age of Onset  . Congestive Heart Failure Mother   . Diabetes Mother   . COPD Mother   . Hypertension Father   . Esophageal cancer Father 62       [facility] at [facility]  . Colon polyps Other   . Hypertension Sister   . Hypertension Sister   . Diabetes Sister   . Breast cancer Neg Hx    Social History   Socioeconomic History  . Marital status: Married    Spouse name: Not on file  . Number of children: Not on file  . Years of education: Not on file  . Highest education level: Not on file  Occupational History  . Not on file  Tobacco Use  . Smoking status: Never Smoker  . Smokeless tobacco: Never Used  Vaping Use  . Vaping Use: Never used  Substance and Sexual Activity  . Alcohol use: Yes     Alcohol/week: 0.0 standard drinks    Comment: rare  . Drug use: No  . Sexual activity: Not on file  Other Topics Concern  . Not on file  Social History Narrative  . Not on file   Social Determinants of Health   Financial Resource Strain: Not on file  Food Insecurity: Not on file  Transportation Needs: Not on file  Physical Activity: Not on file  Stress: Not on file  Social Connections: Not on file    Outpatient Encounter Medications as of 07/31/2020  Medication Sig  . ARMOUR THYROID 90 MG tablet TAKE 1 TABLET(90 MG) BY MOUTH DAILY  . benzonatate (TESSALON PERLES) 100 MG capsule Take 1 capsule (100 mg total) by mouth 3 (three) times daily as needed for cough (cough).  Marland Kitchen estradiol (ESTRACE VAGINAL) 0.1 MG/GM vaginal cream One applicator twice weekly.  Marland Kitchen estradiol (ESTRACE) 0.5 MG tablet TAKE 1 TO 2 TABLETS BY MOUTH EVERY DAY  . fluticasone (FLONASE) 50 MCG/ACT nasal spray Place 2 sprays into both nostrils daily.  . hydrochlorothiazide (HYDRODIURIL) 25 MG tablet TAKE 1 TABLET(25 MG) BY MOUTH DAILY  . meloxicam (MOBIC) 7.5 MG tablet Take 7.5 mg by mouth daily as needed.  . pantoprazole (PROTONIX) 40 MG tablet Take 1  tablet (40 mg total) by mouth daily. Take 30 minutes before breakfast  . progesterone (PROMETRIUM) 100 MG capsule TAKE 1 CAPSULE(100 MG) BY MOUTH DAILY  . sertraline (ZOLOFT) 50 MG tablet TAKE 1 TABLET(50 MG) BY MOUTH DAILY  . tretinoin (RETIN-A) 0.025 % cream Apply 1 application topically as needed.   No facility-administered encounter medications on file as of 07/31/2020.    Review of Systems  Constitutional: Negative for appetite change and unexpected weight change.  HENT: Negative for congestion, sinus pressure and sore throat.   Eyes: Negative for pain and visual disturbance.  Respiratory: Negative for cough, chest tightness and shortness of breath.   Cardiovascular: Negative for chest pain, palpitations and leg swelling.  Gastrointestinal: Negative for abdominal  pain, diarrhea, nausea and vomiting.  Genitourinary: Negative for difficulty urinating and dysuria.  Musculoskeletal: Negative for myalgias.       Persistent knee pain as outlined.  Limits activity.   Skin: Negative for color change and rash.  Neurological: Negative for dizziness, light-headedness and headaches.  Hematological: Negative for adenopathy. Does not bruise/bleed easily.  Psychiatric/Behavioral: Negative for decreased concentration and dysphoric mood.       Objective:    Physical Exam Vitals reviewed.  Constitutional:      General: She is not in acute distress.    Appearance: Normal appearance. She is well-developed and well-nourished.  HENT:     Head: Normocephalic and atraumatic.     Right Ear: External ear normal.     Left Ear: External ear normal.     Mouth/Throat:     Mouth: Oropharynx is clear and moist.  Eyes:     General: No scleral icterus.       Right eye: No discharge.        Left eye: No discharge.     Conjunctiva/sclera: Conjunctivae normal.  Neck:     Thyroid: No thyromegaly.  Cardiovascular:     Rate and Rhythm: Normal rate and regular rhythm.  Pulmonary:     Effort: No tachypnea, accessory muscle usage or respiratory distress.     Breath sounds: Normal breath sounds. No decreased breath sounds or wheezing.  Chest:  Breasts:     Right: No inverted nipple, mass, nipple discharge or tenderness (no axillary adenopathy).     Left: No inverted nipple, mass, nipple discharge or tenderness (no axilarry adenopathy).    Abdominal:     General: Bowel sounds are normal.     Palpations: Abdomen is soft.     Tenderness: There is no abdominal tenderness.  Musculoskeletal:        General: No swelling, tenderness or edema.     Cervical back: Neck supple. No tenderness.  Lymphadenopathy:     Cervical: No cervical adenopathy.  Skin:    Findings: No erythema or rash.  Neurological:     Mental Status: She is alert and oriented to person, place, and time.   Psychiatric:        Mood and Affect: Mood and affect and mood normal.        Behavior: Behavior normal.     BP 118/70   Pulse 77   Temp 98.5 F (36.9 C) (Oral)   Resp 16   Ht _0  (1.676 m)   Wt 201 lb (91.2 kg)   LMP 12/26/2012   SpO2 98%   BMI 32.44 kg/m  Wt Readings from Last 3 Encounters:  07/31/20 201 lb (91.2 kg)  04/29/20 202 lb (91.6 kg)  02/04/20 208 lb 3.2 oz (  94.4 kg)     Lab Results  Component Value Date   WBC 6.6 07/29/2020   HGB 14.9 07/29/2020   HCT 44.1 07/29/2020   PLT 187.0 07/29/2020   GLUCOSE 109 (H) 07/29/2020   CHOL 224 (H) 07/29/2020   TRIG 201.0 (H) 07/29/2020   HDL 51.80 07/29/2020   LDLDIRECT 156.0 07/29/2020   LDLCALC 108 (H) 03/17/2020   ALT 27 07/29/2020   AST 25 07/29/2020   NA 138 07/29/2020   K 3.8 07/29/2020   CL 102 07/29/2020   CREATININE 0.63 07/29/2020   BUN 15 07/29/2020   CO2 29 07/29/2020   TSH 3.67 07/29/2020   HGBA1C 6.0 07/29/2020    MM 3D SCREEN BREAST BILATERAL  Result Date: 08/14/2019 CLINICAL DATA:  Screening. EXAM: DIGITAL SCREENING BILATERAL MAMMOGRAM WITH TOMO AND CAD COMPARISON:  Previous exam(s). ACR Breast Density Category b: There are scattered areas of fibroglandular density. FINDINGS: There are no findings suspicious for malignancy. Images were processed with CAD. IMPRESSION: No mammographic evidence of malignancy. A result letter of this screening mammogram will be mailed directly to the patient. RECOMMENDATION: Screening mammogram in one year. (Code:SM-B-01Y) BI-RADS CATEGORY  1: Negative. Electronically Signed   By: Ammie Ferrier M.D.   On: 08/14/2019 16:22       Assessment & Plan:   Problem List Items Addressed This Visit    Abnormal liver function tests    Previous ultrasound revealed fatty liver.  Recent liver panel wnl.  Follow.       Health care maintenance    Physical today 07/31/20.  Colonoscopy 09/2016 - tubular adenoma.  F/u colonoscopy in 5 years.  Mammogram 08/14/19 - Birads I.  F/u  mammogram ordered.       Hypercholesterolemia    The 10-year ASCVD risk score Mikey Bussing DC Brooke Bonito., et al., 2013) is: 3.2%   Values used to calculate the score:     Age: 30 years     Sex: Female     Is Non-Hispanic African American: No     Diabetic: No     Tobacco smoker: No     Systolic Blood Pressure: 544 mmHg     Is BP treated: No     HDL Cholesterol: 51.8 mg/dL     Total Cholesterol: 224 mg/dL  Discussed recent labs.  Low cholesterol diet and exercise.  Follow lipid panel.        Relevant Orders   Lipid panel   Hepatic function panel   Basic metabolic panel   Hyperglycemia    Low carb diet and exercise.  Follow met b and a1c.       Relevant Orders   Hemoglobin A1c   Hypothyroid    On thyroid replacement.  Follow tsh.       Relevant Orders   TSH   Knee pain, bilateral    Seeing ortho.  S/p injection and trial of antiinflammatories.  Persistent pain.  Limits activity.  Recent injection helped some.  Per pt, insurance denied gel injections.  Continue f/u with orhto.       Stress    On zoloft.  Handling stress.  Follow.         Other Visit Diagnoses    Routine general medical examination at a health care facility    -  Primary   Visit for screening mammogram       Relevant Orders   MM 3D SCREEN BREAST BILATERAL       Einar Pheasant, MD

## 2020-07-31 NOTE — Assessment & Plan Note (Signed)
Physical today 07/31/20.  Colonoscopy 09/2016 - tubular adenoma.  F/u colonoscopy in 5 years.  Mammogram 08/14/19 - Birads I.  F/u mammogram ordered.

## 2020-07-31 NOTE — Assessment & Plan Note (Addendum)
The 10-year ASCVD risk score Denman George DC Montez Hageman., et al., 2013) is: 3.2%   Values used to calculate the score:     Age: 60 years     Sex: Female     Is Non-Hispanic African American: No     Diabetic: No     Tobacco smoker: No     Systolic Blood Pressure: 118 mmHg     Is BP treated: No     HDL Cholesterol: 51.8 mg/dL     Total Cholesterol: 224 mg/dL  Discussed recent labs.  Low cholesterol diet and exercise.  Follow lipid panel.

## 2020-08-02 ENCOUNTER — Encounter: Payer: Self-pay | Admitting: Internal Medicine

## 2020-08-02 NOTE — Assessment & Plan Note (Signed)
Previous ultrasound revealed fatty liver.  Recent liver panel wnl.  Follow.

## 2020-08-02 NOTE — Assessment & Plan Note (Signed)
On zoloft.  Handling stress.  Follow.   

## 2020-08-02 NOTE — Assessment & Plan Note (Signed)
Low carb diet and exercise.  Follow met b and a1c.  

## 2020-08-02 NOTE — Assessment & Plan Note (Signed)
On thyroid replacement.  Follow tsh.  

## 2020-08-02 NOTE — Assessment & Plan Note (Signed)
Seeing ortho.  S/p injection and trial of antiinflammatories.  Persistent pain.  Limits activity.  Recent injection helped some.  Per pt, insurance denied gel injections.  Continue f/u with orhto.

## 2020-08-05 ENCOUNTER — Encounter: Payer: Self-pay | Admitting: Internal Medicine

## 2020-08-06 ENCOUNTER — Encounter: Payer: Self-pay | Admitting: Internal Medicine

## 2020-09-10 ENCOUNTER — Other Ambulatory Visit: Payer: Self-pay

## 2020-09-10 ENCOUNTER — Ambulatory Visit
Admission: RE | Admit: 2020-09-10 | Discharge: 2020-09-10 | Disposition: A | Discharge status: Home / Self Care | Payer: BC Managed Care – PPO | Source: Ambulatory Visit | Attending: Internal Medicine | Admitting: Internal Medicine

## 2020-09-10 DIAGNOSIS — Z1231 Encounter for screening mammogram for malignant neoplasm of breast: Secondary | ICD-10-CM | POA: Insufficient documentation

## 2020-09-15 ENCOUNTER — Other Ambulatory Visit: Payer: Self-pay | Admitting: Internal Medicine

## 2020-10-08 ENCOUNTER — Other Ambulatory Visit: Payer: Self-pay | Admitting: Internal Medicine

## 2020-10-22 ENCOUNTER — Encounter: Payer: Self-pay | Admitting: Internal Medicine

## 2020-10-22 NOTE — Telephone Encounter (Signed)
Ok

## 2020-10-22 NOTE — Telephone Encounter (Signed)
ok 

## 2020-10-22 NOTE — Telephone Encounter (Signed)
Please schedule new pt appt.

## 2020-10-23 NOTE — Telephone Encounter (Signed)
Spoke to Patient yesterday, she stated she would keep the July new patient appointment with Flinchum.

## 2020-10-23 NOTE — Telephone Encounter (Signed)
Carol Jimenez spoke to Ramer

## 2020-10-29 ENCOUNTER — Other Ambulatory Visit: Payer: Self-pay | Admitting: Internal Medicine

## 2020-11-05 DIAGNOSIS — M5416 Radiculopathy, lumbar region: Secondary | ICD-10-CM | POA: Diagnosis not present

## 2020-11-13 ENCOUNTER — Encounter: Payer: Self-pay | Admitting: Internal Medicine

## 2020-11-13 ENCOUNTER — Telehealth: Payer: BC Managed Care – PPO | Admitting: Emergency Medicine

## 2020-11-13 DIAGNOSIS — R059 Cough, unspecified: Secondary | ICD-10-CM | POA: Diagnosis not present

## 2020-11-13 MED ORDER — BENZONATATE 100 MG PO CAPS: 100.0000 mg | ORAL_CAPSULE | Freq: Two times a day (BID) | ORAL | 0 refills | Status: DC | PRN

## 2020-11-13 MED ORDER — DOXYCYCLINE HYCLATE 100 MG PO CAPS: 100.0000 mg | ORAL_CAPSULE | Freq: Two times a day (BID) | ORAL | 0 refills | Status: DC

## 2020-11-13 NOTE — Progress Notes (Signed)
We are sorry that you are not feeling well.  Here is how we plan to help!  Based on your presentation I believe you most likely have A cough due to bacteria.  When patients have a fever and a productive cough with a change in color or increased sputum production, we are concerned about bacterial bronchitis.  If left untreated it can progress to pneumonia.  If your symptoms do not improve with your treatment plan it is important that you contact your provider.   I have prescribed Doxycycline 100 mg twice a day for 7 days     In addition you may use A prescription cough medication called Tessalon Perles 100mg. You may take 1-2 capsules every 8 hours as needed for your cough.    From your responses in the eVisit questionnaire you describe inflammation in the upper respiratory tract which is causing a significant cough.  This is commonly called Bronchitis and has four common causes:    Allergies  Viral Infections  Acid Reflux  Bacterial Infection Allergies, viruses and acid reflux are treated by controlling symptoms or eliminating the cause. An example might be a cough caused by taking certain blood pressure medications. You stop the cough by changing the medication. Another example might be a cough caused by acid reflux. Controlling the reflux helps control the cough.  USE OF BRONCHODILATOR ("RESCUE") INHALERS: There is a risk from using your bronchodilator too frequently.  The risk is that over-reliance on a medication which only relaxes the muscles surrounding the breathing tubes can reduce the effectiveness of medications prescribed to reduce swelling and congestion of the tubes themselves.  Although you feel brief relief from the bronchodilator inhaler, your asthma may actually be worsening with the tubes becoming more swollen and filled with mucus.  This can delay other crucial treatments, such as oral steroid medications. If you need to use a bronchodilator inhaler daily, several times per  day, you should discuss this with your provider.  There are probably better treatments that could be used to keep your asthma under control.     HOME CARE . Only take medications as instructed by your medical team. . Complete the entire course of an antibiotic. . Drink plenty of fluids and get plenty of rest. . Avoid close contacts especially the very young and the elderly . Cover your mouth if you cough or cough into your sleeve. . Always remember to wash your hands . A steam or ultrasonic humidifier can help congestion.   GET HELP RIGHT AWAY IF: . You develop worsening fever. . You become short of breath . You cough up blood. . Your symptoms persist after you have completed your treatment plan MAKE SURE YOU   Understand these instructions.  Will watch your condition.  Will get help right away if you are not doing well or get worse.  Your e-visit answers were reviewed by a board certified advanced clinical practitioner to complete your personal care plan.  Depending on the condition, your plan could have included both over the counter or prescription medications. If there is a problem please reply  once you have received a response from your provider. Your safety is important to us.  If you have drug allergies check your prescription carefully.    You can use MyChart to ask questions about today's visit, request a non-urgent call back, or ask for a work or school excuse for 24 hours related to this e-Visit. If it has been greater than 24 hours   you will need to follow up with your provider, or enter a new e-Visit to address those concerns. You will get an e-mail in the next two days asking about your experience.  I hope that your e-visit has been valuable and will speed your recovery. Thank you for using e-visits.  Approximately 5 minutes was used in reviewing the patient's chart, questionnaire, prescribing medications, and documentation.

## 2020-11-15 ENCOUNTER — Other Ambulatory Visit: Payer: Self-pay | Admitting: Internal Medicine

## 2020-11-25 ENCOUNTER — Telehealth: Payer: BC Managed Care – PPO | Admitting: Physician Assistant

## 2020-11-25 DIAGNOSIS — R300 Dysuria: Secondary | ICD-10-CM

## 2020-11-25 MED ORDER — CEPHALEXIN 500 MG PO CAPS: 500.0000 mg | ORAL_CAPSULE | Freq: Two times a day (BID) | ORAL | 0 refills | Status: AC

## 2020-11-25 NOTE — Progress Notes (Signed)
I have spent 5 minutes in review of e-visit questionnaire, review and updating patient chart, medical decision making and response to patient.   Chavela Justiniano Cody Rehmat Murtagh, PA-C    

## 2020-11-25 NOTE — Progress Notes (Signed)

## 2020-11-27 ENCOUNTER — Other Ambulatory Visit: Payer: Self-pay

## 2020-11-27 ENCOUNTER — Encounter: Payer: Self-pay | Admitting: Internal Medicine

## 2020-11-27 ENCOUNTER — Ambulatory Visit
Admission: EM | Admit: 2020-11-27 | Discharge: 2020-11-27 | Disposition: A | Discharge status: Home / Self Care | Payer: BC Managed Care – PPO | Attending: Emergency Medicine | Admitting: Emergency Medicine

## 2020-11-27 DIAGNOSIS — R030 Elevated blood-pressure reading, without diagnosis of hypertension: Secondary | ICD-10-CM

## 2020-11-27 DIAGNOSIS — R300 Dysuria: Secondary | ICD-10-CM

## 2020-11-27 LAB — POCT URINALYSIS DIP (MANUAL ENTRY)
Bilirubin, UA: NEGATIVE
Glucose, UA: NEGATIVE mg/dL
Ketones, POC UA: NEGATIVE mg/dL
Leukocytes, UA: NEGATIVE
Nitrite, UA: NEGATIVE
Protein Ur, POC: 100 mg/dL — AB
Spec Grav, UA: >=1.03 — AB (ref 1.010–1.025)
Urobilinogen, UA: 0.2 E.U./dL
pH, UA: 6 (ref 5.0–8.0)

## 2020-11-27 NOTE — ED Provider Notes (Signed)
[name]    CSN: [id number] Arrival date & time: 11/27/20  1458      History   Chief Complaint Chief Complaint  Patient presents with  . Dysuria    HPI Carol Jimenez is a 60 y.o. female.   Patient presents with dysuria and hematuria for 5 days.  She had an E-visit with her PCP on 11/25/2020; diagnosed with dysuria; treated with Keflex.  She states her symptoms have not improved with the Keflex.  She denies fever, chills, abdominal pain, flank pain, vaginal discharge, pelvic pain, or other symptoms.  No recent urine culture in chart.  Patient's medical history includes history of UTI, hypothyroidism, allergies.     The history is provided by the patient and medical records.    Past Medical History:  Diagnosis Date  . Allergy   . Hx: UTI (urinary tract infection)   . Hypothyroidism     Patient Active Problem List   Diagnosis Date Noted  . Vaginal dryness 05/05/2020  . Left ankle pain 02/04/2020  . Acid reflux 11/19/2019  . Change in hearing 11/19/2019  . Chest pain 11/08/2019  . Knee pain, bilateral 07/03/2019  . Sinusitis 04/28/2019  . Congestion of nasal sinus 04/22/2019  . Toe pain, left 01/22/2018  . Hyperglycemia 06/13/2016  . Right knee pain 08/17/2015  . Stress 03/23/2015  . Health care maintenance 12/24/2014  . History of non anemic vitamin B12 deficiency 12/23/2014  . Vitamin D deficiency 12/23/2014  . Rib pain on right side 12/23/2014  . Pseudoangiomatous stromal hyperplasia of breast 08/12/2014  . BMI 33.0-33.9,adult 04/13/2014  . Abnormal liver function tests 04/13/2014  . Hypothyroid 11/12/2013  . Menopausal symptoms 11/12/2013  . Side pain 11/12/2013  . Fatigue 11/12/2013  . Daytime somnolence 11/12/2013  . Environmental allergies 11/12/2013  . Hypercholesterolemia 11/12/2013    Past Surgical History:  Procedure Laterality Date  . BREAST BIOPSY Left 2015   negative, stereotactic biopsy  . CESAREAN SECTION    . ENDOMETRIAL  ABLATION  2000  . KNEE SURGERY Left     OB History    Gravida  2   Para  2   Term      Preterm      AB      Living  2     SAB      IAB      Ectopic      Multiple      Live Births           Obstetric Comments  1st Menstrual Cycle:  11  1st Pregnancy:  27         Home Medications    Prior to Admission medications   Medication Sig Start Date End Date Taking? Authorizing Provider  ARMOUR THYROID 90 MG tablet TAKE 1 TABLET(90 MG) BY MOUTH DAILY 05/26/20   Dale Arthur, MD  cephALEXin (KEFLEX) 500 MG capsule Take 1 capsule (500 mg total) by mouth 2 (two) times daily for 7 days. 11/25/20 12/02/20  Waldon Merl, PA-C  estradiol (ESTRACE) 0.1 MG/GM vaginal cream INSERT 1 APPLICATORFUL VAGINALLY AT BEDTIME FOR 5 DAYS THEN USE VAGINALLY 2 TIMES A WEEK 10/08/20   Dale [place], MD  estradiol (ESTRACE) 0.5 MG tablet TAKE 1 TO 2 TABLETS BY MOUTH EVERY DAY 05/26/20   Dale , MD  fluticasone (FLONASE) 50 MCG/ACT nasal spray Place 2 sprays into both nostrils daily. 06/27/20   Muthersbaugh, Dahlia Client, PA-C  hydrochlorothiazide (HYDRODIURIL) 25 MG tablet TAKE 1 TABLET(25  MG) BY MOUTH DAILY 10/29/20   Dale Hidalgo, MD  meloxicam (MOBIC) 7.5 MG tablet Take 7.5 mg by mouth daily as needed. 01/20/20   [provider]  pantoprazole (PROTONIX) 40 MG tablet TAKE 1 TABLET(40 MG) BY MOUTH DAILY 30 MINUTES BEFORE BREAKFAST 09/15/20   Dale [place], MD  progesterone (PROMETRIUM) 100 MG capsule TAKE 1 CAPSULE(100 MG) BY MOUTH DAILY 11/17/20   Dale [place], MD  sertraline (ZOLOFT) 50 MG tablet TAKE 1 TABLET(50 MG) BY MOUTH DAILY 05/26/20   Dale , MD  tretinoin (RETIN-A) 0.025 % cream Apply 1 application topically as needed. 11/21/19   [provider]    Family History Family History  Problem Relation Age of Onset  . Congestive Heart Failure Mother   . Diabetes Mother   . COPD Mother   . Hypertension Father   . Esophageal cancer Father 28        [facility] at [facility]  . Colon polyps Other   . Hypertension Sister   . Hypertension Sister   . Diabetes Sister   . Breast cancer Neg Hx     Social History Social History   Tobacco Use  . Smoking status: Never Smoker  . Smokeless tobacco: Never Used  Vaping Use  . Vaping Use: Never used  Substance Use Topics  . Alcohol use: Yes    Alcohol/week: 0.0 standard drinks    Comment: rare  . Drug use: No     Allergies   Sulfa antibiotics   Review of Systems Review of Systems  Constitutional: Negative for chills and fever.  Respiratory: Negative for cough and shortness of breath.   Cardiovascular: Negative for chest pain and palpitations.  Gastrointestinal: Negative for abdominal pain and vomiting.  Genitourinary: Positive for dysuria and hematuria. Negative for flank pain, pelvic pain and vaginal discharge.  Skin: Negative for color change and rash.  All other systems reviewed and are negative.    Physical Exam Triage Vital Signs ED Triage Vitals  Enc Vitals Group     BP      Pulse      Resp      Temp      Temp src      SpO2      Weight      Height      Head Circumference      Peak Flow      Pain Score      Pain Loc      Pain Edu?      Excl. in GC?    No data found.  Updated Vital Signs BP (!) 151/77   Pulse 69   Temp 98.6 F (37 C) (Oral)   Resp 16   Ht 5\' 6"  (1.676 m)   Wt 200 lb (90.7 kg)   LMP 12/26/2012   SpO2 97%   BMI 32.28 kg/m   Visual Acuity Right Eye Distance:   Left Eye Distance:   Bilateral Distance:    Right Eye Near:   Left Eye Near:    Bilateral Near:     Physical Exam Vitals and nursing note reviewed.  Constitutional:      General: She is not in acute distress.    Appearance: She is well-developed. She is not ill-appearing.  HENT:     Head: Normocephalic and atraumatic.  Eyes:     Conjunctiva/sclera: Conjunctivae normal.  Cardiovascular:     Rate and Rhythm: Normal rate and regular rhythm.     Heart sounds: Normal heart  sounds.  Pulmonary:     Effort: Pulmonary effort is normal. No respiratory distress.     Breath sounds: Normal breath sounds.  Abdominal:     General: Bowel sounds are normal. There is no distension.     Palpations: Abdomen is soft.     Tenderness: There is no abdominal tenderness. There is no right CVA tenderness, left CVA tenderness, guarding or rebound.  Musculoskeletal:     Cervical back: Neck supple.  Skin:    General: Skin is warm and dry.  Neurological:     General: No focal deficit present.     Mental Status: She is alert and oriented to person, place, and time.     Gait: Gait normal.  Psychiatric:        Mood and Affect: Mood normal.        Behavior: Behavior normal.      UC Treatments / Results  Labs (all labs ordered are listed, but only abnormal results are displayed) Labs Reviewed  POCT URINALYSIS DIP (MANUAL ENTRY) - Abnormal; Notable for the following components:      Result Value   Spec Grav, UA >=1.030 (*)    Blood, UA large (*)    Protein Ur, POC =100 (*)    All other components within normal limits  URINE CULTURE    EKG   Radiology No results found.  Procedures Procedures (including critical care time)  Medications Ordered in UC Medications - No data to display  Initial Impression / Assessment and Plan / UC Course  I have reviewed the triage vital signs and the nursing notes.  Pertinent labs & imaging results that were available during my care of the patient were reviewed by me and considered in my medical decision making (see chart for details).   Dysuria.  Elevated blood pressure reading.  Urine culture pending.  Instructed patient to continue taking the Keflex as prescribed by her PCP.  Discussed that we will call her if the urine culture shows that the antibiotic needs to be changed.  Instructed her to increase her water intake.  Education provided on dysuria.  Also discussed that her blood pressure is elevated today and needs to be  rechecked by her PCP in 2 to 4 weeks.  Education provided on preventing hypertension.  Patient agrees to plan of care.   Final Clinical Impressions(s) / UC Diagnoses   Final diagnoses:  Dysuria  Elevated blood pressure reading     Discharge Instructions     Continue to take the antibiotic as directed.  The urine culture is pending.  We will call you if it shows the need to change or discontinue the antibiotic.    Your blood pressure is elevated today at 151/77.  Please have this rechecked by your primary care provider in 2-4 weeks.         ED Prescriptions    None     PDMP not reviewed this encounter.   Mickie Bail, NP 11/27/20 1540

## 2020-11-27 NOTE — ED Triage Notes (Signed)
Pt reports having dysuria and hematuria since Sunday. sts she had an e-visit on Tuesday and prescribed Keflex. sts symptoms are not getting better.

## 2020-11-27 NOTE — Discharge Instructions (Addendum)
Continue to take the antibiotic as directed.  The urine culture is pending.  We will call you if it shows the need to change or discontinue the antibiotic.    Your blood pressure is elevated today at 151/77.  Please have this rechecked by your primary care provider in 2-4 weeks.

## 2020-11-29 LAB — URINE CULTURE: Culture: NO GROWTH

## 2020-12-01 ENCOUNTER — Other Ambulatory Visit: Payer: BC Managed Care – PPO

## 2020-12-03 ENCOUNTER — Ambulatory Visit: Payer: BC Managed Care – PPO | Admitting: Internal Medicine

## 2020-12-22 ENCOUNTER — Encounter: Payer: Self-pay | Admitting: Internal Medicine

## 2020-12-22 DIAGNOSIS — E039 Hypothyroidism, unspecified: Secondary | ICD-10-CM

## 2020-12-24 NOTE — Telephone Encounter (Signed)
I have added the free T3 and free T4 to the other labs ordered.  Please schedule her for a lab appt - fasting.  Need all labs.  Also, need to confirm pt doing ok.  If needs earlier appt, let me know.

## 2020-12-24 NOTE — Telephone Encounter (Signed)
LMTCB

## 2020-12-31 DIAGNOSIS — M5459 Other low back pain: Secondary | ICD-10-CM | POA: Diagnosis not present

## 2020-12-31 DIAGNOSIS — M25562 Pain in left knee: Secondary | ICD-10-CM | POA: Diagnosis not present

## 2020-12-31 DIAGNOSIS — M25561 Pain in right knee: Secondary | ICD-10-CM | POA: Diagnosis not present

## 2020-12-31 DIAGNOSIS — M170 Bilateral primary osteoarthritis of knee: Secondary | ICD-10-CM | POA: Diagnosis not present

## 2021-01-02 NOTE — Telephone Encounter (Signed)
I called the patient and scheduled a lab appointment with her to check her lbs.  Haruki Arnold,cma

## 2021-01-06 DIAGNOSIS — M170 Bilateral primary osteoarthritis of knee: Secondary | ICD-10-CM | POA: Diagnosis not present

## 2021-01-06 DIAGNOSIS — M25562 Pain in left knee: Secondary | ICD-10-CM | POA: Diagnosis not present

## 2021-01-06 DIAGNOSIS — M5459 Other low back pain: Secondary | ICD-10-CM | POA: Diagnosis not present

## 2021-01-06 DIAGNOSIS — M25561 Pain in right knee: Secondary | ICD-10-CM | POA: Diagnosis not present

## 2021-01-07 ENCOUNTER — Other Ambulatory Visit (INDEPENDENT_AMBULATORY_CARE_PROVIDER_SITE_OTHER): Payer: BC Managed Care – PPO

## 2021-01-07 ENCOUNTER — Other Ambulatory Visit: Payer: Self-pay

## 2021-01-07 DIAGNOSIS — E7800 Pure hypercholesterolemia, unspecified: Secondary | ICD-10-CM | POA: Diagnosis not present

## 2021-01-07 DIAGNOSIS — E039 Hypothyroidism, unspecified: Secondary | ICD-10-CM | POA: Diagnosis not present

## 2021-01-07 DIAGNOSIS — R739 Hyperglycemia, unspecified: Secondary | ICD-10-CM | POA: Diagnosis not present

## 2021-01-07 LAB — BASIC METABOLIC PANEL WITH GFR
BUN: 20 mg/dL (ref 6–23)
CO2: 29 meq/L (ref 19–32)
Calcium: 9.2 mg/dL (ref 8.4–10.5)
Chloride: 103 meq/L (ref 96–112)
Creatinine, Ser: 0.67 mg/dL (ref 0.40–1.20)
GFR: 94.71 mL/min
Glucose, Bld: 99 mg/dL (ref 70–99)
Potassium: 3.8 meq/L (ref 3.5–5.1)
Sodium: 140 meq/L (ref 135–145)

## 2021-01-07 LAB — HEPATIC FUNCTION PANEL
ALT: 24 U/L (ref 0–35)
AST: 22 U/L (ref 0–37)
Albumin: 4.2 g/dL (ref 3.5–5.2)
Alkaline Phosphatase: 66 U/L (ref 39–117)
Bilirubin, Direct: 0.1 mg/dL (ref 0.0–0.3)
Total Bilirubin: 0.6 mg/dL (ref 0.2–1.2)
Total Protein: 6.8 g/dL (ref 6.0–8.3)

## 2021-01-07 LAB — LIPID PANEL
Cholesterol: 196 mg/dL (ref 0–200)
HDL: 49.6 mg/dL (ref >39.00)
NonHDL: 146.34
Total CHOL/HDL Ratio: 4
Triglycerides: 210 mg/dL — ABNORMAL HIGH (ref 0.0–149.0)
VLDL: 42 mg/dL — ABNORMAL HIGH (ref 0.0–40.0)

## 2021-01-07 LAB — TSH: TSH: 1.65 u[IU]/mL (ref 0.35–5.50)

## 2021-01-07 LAB — HEMOGLOBIN A1C: Hgb A1c MFr Bld: 5.9 % (ref 4.6–6.5)

## 2021-01-07 LAB — LDL CHOLESTEROL, DIRECT: Direct LDL: 127 mg/dL

## 2021-01-07 LAB — T3, FREE: T3, Free: 4.1 pg/mL (ref 2.3–4.2)

## 2021-01-07 LAB — T4, FREE: Free T4: 0.65 ng/dL (ref 0.60–1.60)

## 2021-01-22 DIAGNOSIS — M25562 Pain in left knee: Secondary | ICD-10-CM | POA: Diagnosis not present

## 2021-01-22 DIAGNOSIS — M170 Bilateral primary osteoarthritis of knee: Secondary | ICD-10-CM | POA: Diagnosis not present

## 2021-01-22 DIAGNOSIS — M25561 Pain in right knee: Secondary | ICD-10-CM | POA: Diagnosis not present

## 2021-01-22 DIAGNOSIS — M5459 Other low back pain: Secondary | ICD-10-CM | POA: Diagnosis not present

## 2021-01-26 ENCOUNTER — Other Ambulatory Visit: Payer: BC Managed Care – PPO

## 2021-01-26 DIAGNOSIS — M5459 Other low back pain: Secondary | ICD-10-CM | POA: Diagnosis not present

## 2021-01-26 DIAGNOSIS — M25562 Pain in left knee: Secondary | ICD-10-CM | POA: Diagnosis not present

## 2021-01-26 DIAGNOSIS — M170 Bilateral primary osteoarthritis of knee: Secondary | ICD-10-CM | POA: Diagnosis not present

## 2021-01-26 DIAGNOSIS — M25561 Pain in right knee: Secondary | ICD-10-CM | POA: Diagnosis not present

## 2021-01-27 ENCOUNTER — Ambulatory Visit: Payer: BC Managed Care – PPO | Admitting: Internal Medicine

## 2021-01-27 ENCOUNTER — Other Ambulatory Visit: Payer: Self-pay

## 2021-01-27 ENCOUNTER — Encounter: Payer: Self-pay | Admitting: Internal Medicine

## 2021-01-27 DIAGNOSIS — E039 Hypothyroidism, unspecified: Secondary | ICD-10-CM

## 2021-01-27 DIAGNOSIS — K219 Gastro-esophageal reflux disease without esophagitis: Secondary | ICD-10-CM

## 2021-01-27 DIAGNOSIS — M25561 Pain in right knee: Secondary | ICD-10-CM

## 2021-01-27 DIAGNOSIS — E7800 Pure hypercholesterolemia, unspecified: Secondary | ICD-10-CM

## 2021-01-27 DIAGNOSIS — M25562 Pain in left knee: Secondary | ICD-10-CM

## 2021-01-27 DIAGNOSIS — R739 Hyperglycemia, unspecified: Secondary | ICD-10-CM

## 2021-01-27 DIAGNOSIS — R945 Abnormal results of liver function studies: Secondary | ICD-10-CM

## 2021-01-27 DIAGNOSIS — F439 Reaction to severe stress, unspecified: Secondary | ICD-10-CM | POA: Diagnosis not present

## 2021-01-27 DIAGNOSIS — R7989 Other specified abnormal findings of blood chemistry: Secondary | ICD-10-CM

## 2021-01-27 NOTE — Progress Notes (Signed)
Patient ID: Carol Jimenez, female   DOB: 08/30/1960, 59 y.o.   MRN: [id number]   Subjective:    Patient ID: Carol Jimenez, female    DOB: 10/08/1960, 59 y.o.   MRN: [id number]  HPI This visit occurred during the SARS-CoV-2 public health emergency.  Safety protocols were in place, including screening questions prior to the visit, additional usage of staff PPE, and extensive cleaning of exam room while observing appropriate contact time as indicated for disinfecting solutions.   Patient here for a scheduled follow up.  Here to follow up regarding her cholesterol.  Has been having increased back/knee pain.  Seeing ortho.  S/p back injection.  States this did not help.  S/p injection in her knee and this helped her back issues.  Feels better.  Able to be more active.  No chest pain or sob reported.  No abdominal pain or bowel change reported.  No nausea, vomiting or abdominal pain reported.  Feels better.  Appears to be handling stress well.    Past Medical History:  Diagnosis Date   Allergy    Hx: UTI (urinary tract infection)    Hypothyroidism    Past Surgical History:  Procedure Laterality Date   BREAST BIOPSY Left 2015   negative, stereotactic biopsy   CESAREAN SECTION     ENDOMETRIAL ABLATION  2000   KNEE SURGERY Left    Family History  Problem Relation Age of Onset   Congestive Heart Failure Mother    Diabetes Mother    COPD Mother    Hypertension Father    Esophageal cancer Father 60       treated at [facility]   Colon polyps Other    Hypertension Sister    Hypertension Sister    Diabetes Sister    Breast cancer Neg Hx    Social History   Socioeconomic History   Marital status: Married    Spouse name: Not on file   Number of children: Not on file   Years of education: Not on file   Highest education level: Not on file  Occupational History   Not on file  Tobacco Use   Smoking status: Never   Smokeless tobacco: Never  Vaping Use   Vaping Use: Never used  Substance  and Sexual Activity   Alcohol use: Yes    Alcohol/week: 0.0 standard drinks    Comment: rare   Drug use: No   Sexual activity: Not on file  Other Topics Concern   Not on file  Social History Narrative   Not on file   Social Determinants of Health   Financial Resource Strain: Not on file  Food Insecurity: Not on file  Transportation Needs: Not on file  Physical Activity: Not on file  Stress: Not on file  Social Connections: Not on file    Review of Systems  Constitutional:  Negative for appetite change and unexpected weight change.  HENT:  Negative for congestion and sinus pressure.   Respiratory:  Negative for cough, chest tightness and shortness of breath.   Cardiovascular:  Negative for chest pain, palpitations and leg swelling.  Gastrointestinal:  Negative for abdominal pain, diarrhea, nausea and vomiting.  Genitourinary:  Negative for difficulty urinating and dysuria.  Musculoskeletal:  Negative for joint swelling and myalgias.  Skin:  Negative for color change and rash.  Neurological:  Negative for dizziness, light-headedness and headaches.  Psychiatric/Behavioral:  Negative for agitation and dysphoric mood.       Objective:      Physical Exam Vitals reviewed.  Constitutional:      General: She is not in acute distress.    Appearance: Normal appearance.  HENT:     Head: Normocephalic and atraumatic.     Right Ear: External ear normal.     Left Ear: External ear normal.  Eyes:     General: No scleral icterus.       Right eye: No discharge.        Left eye: No discharge.     Conjunctiva/sclera: Conjunctivae normal.  Neck:     Thyroid: No thyromegaly.  Cardiovascular:     Rate and Rhythm: Normal rate and regular rhythm.  Pulmonary:     Effort: No respiratory distress.     Breath sounds: Normal breath sounds. No wheezing.  Abdominal:     General: Bowel sounds are normal.     Palpations: Abdomen is soft.     Tenderness: There is no abdominal tenderness.   Musculoskeletal:        General: No swelling or tenderness.     Cervical back: Neck supple. No tenderness.  Lymphadenopathy:     Cervical: No cervical adenopathy.  Skin:    Findings: No erythema or rash.  Neurological:     Mental Status: She is alert.  Psychiatric:        Mood and Affect: Mood normal.        Behavior: Behavior normal.    BP 128/80   Pulse 90   Temp (!) 96.1 F (35.6 C)   Ht 5' 5.98" (1.676 m)   Wt 202 lb 6.4 oz (91.8 kg)   LMP 12/26/2012   SpO2 97%   BMI 32.68 kg/m  Wt Readings from Last 3 Encounters:  01/27/21 202 lb 6.4 oz (91.8 kg)  11/27/20 200 lb (90.7 kg)  07/31/20 201 lb (91.2 kg)    Outpatient Encounter Medications as of 01/27/2021  Medication Sig   ARMOUR THYROID 90 MG tablet TAKE 1 TABLET(90 MG) BY MOUTH DAILY   estradiol (ESTRACE) 0.1 MG/GM vaginal cream INSERT 1 APPLICATORFUL VAGINALLY AT BEDTIME FOR 5 DAYS THEN USE VAGINALLY 2 TIMES A WEEK   estradiol (ESTRACE) 0.5 MG tablet TAKE 1 TO 2 TABLETS BY MOUTH EVERY DAY   fluticasone (FLONASE) 50 MCG/ACT nasal spray Place 2 sprays into both nostrils daily.   hydrochlorothiazide (HYDRODIURIL) 25 MG tablet TAKE 1 TABLET(25 MG) BY MOUTH DAILY   meloxicam (MOBIC) 7.5 MG tablet Take 7.5 mg by mouth daily as needed.   pantoprazole (PROTONIX) 40 MG tablet TAKE 1 TABLET(40 MG) BY MOUTH DAILY 30 MINUTES BEFORE BREAKFAST   progesterone (PROMETRIUM) 100 MG capsule TAKE 1 CAPSULE(100 MG) BY MOUTH DAILY   sertraline (ZOLOFT) 50 MG tablet TAKE 1 TABLET(50 MG) BY MOUTH DAILY   tretinoin (RETIN-A) 0.025 % cream Apply 1 application topically as needed.   No facility-administered encounter medications on file as of 01/27/2021.     Lab Results  Component Value Date   WBC 6.6 07/29/2020   HGB 14.9 07/29/2020   HCT 44.1 07/29/2020   PLT 187.0 07/29/2020   GLUCOSE 99 01/07/2021   CHOL 196 01/07/2021   TRIG 210.0 (H) 01/07/2021   HDL 49.60 01/07/2021   LDLDIRECT 127.0 01/07/2021   LDLCALC 108 (H) 03/17/2020    ALT 24 01/07/2021   AST 22 01/07/2021   NA 140 01/07/2021   K 3.8 01/07/2021   CL 103 01/07/2021   CREATININE 0.67 01/07/2021   BUN 20 01/07/2021   CO2 29 01/07/2021  TSH 1.65 01/07/2021   HGBA1C 5.9 01/07/2021       Assessment & Plan:   Problem List Items Addressed This Visit     Abnormal liver function tests    Liver panel wnl 01/07/21        Acid reflux    Protonix.  No increased symptoms reported.         Hypercholesterolemia    The 10-year ASCVD risk score (Goff DC Jr., et al., 2013) is: 3.5%   Values used to calculate the score:     Age: 59 years     Sex: Female     Is Non-Hispanic African American: No     Diabetic: No     Tobacco smoker: No     Systolic Blood Pressure: 128 mmHg     Is BP treated: No     HDL Cholesterol: 49.6 mg/dL     Total Cholesterol: 196 mg/dL  Low cholesterol diet and exercise.  Follow lipid panel.        Relevant Orders   Hepatic function panel   Lipid panel   Basic metabolic panel   Hyperglycemia    Low carb diet and exercise.  Follow met b and a1c.        Relevant Orders   Hemoglobin A1c   Hypothyroid    On thyroid replacement.  Follow tsh.        Knee pain, bilateral    Seeing ortho.  Back injection did not help. Knee injection - helped.  Feels better.  Continue f/u with ortho as needed.  Follow.         Right knee pain    S/p injection.  Helped back issues.  Feeling better.  Follow.        Stress    On zoloft.  Appears to be handling stress well.   Follow.           Charlene Scott, MD  

## 2021-01-29 DIAGNOSIS — M5459 Other low back pain: Secondary | ICD-10-CM | POA: Diagnosis not present

## 2021-01-29 DIAGNOSIS — M25561 Pain in right knee: Secondary | ICD-10-CM | POA: Diagnosis not present

## 2021-01-29 DIAGNOSIS — M170 Bilateral primary osteoarthritis of knee: Secondary | ICD-10-CM | POA: Diagnosis not present

## 2021-01-29 DIAGNOSIS — M25562 Pain in left knee: Secondary | ICD-10-CM | POA: Diagnosis not present

## 2021-02-01 ENCOUNTER — Encounter: Payer: Self-pay | Admitting: Internal Medicine

## 2021-02-01 NOTE — Assessment & Plan Note (Signed)
S/p injection.  Helped back issues.  Feeling better.  Follow.

## 2021-02-01 NOTE — Assessment & Plan Note (Signed)
Protonix.  No increased symptoms reported.

## 2021-02-01 NOTE — Assessment & Plan Note (Signed)
Seeing ortho.  Back injection did not help. Knee injection - helped.  Feels better.  Continue f/u with ortho as needed.  Follow.

## 2021-02-01 NOTE — Assessment & Plan Note (Signed)
Liver panel wnl 01/07/21

## 2021-02-01 NOTE — Assessment & Plan Note (Signed)
The 10-year ASCVD risk score Denman George DC Montez Hageman., et al., 2013) is: 3.5%   Values used to calculate the score:     Age: 60 years     Sex: Female     Is Non-Hispanic African American: No     Diabetic: No     Tobacco smoker: No     Systolic Blood Pressure: 128 mmHg     Is BP treated: No     HDL Cholesterol: 49.6 mg/dL     Total Cholesterol: 196 mg/dL  Low cholesterol diet and exercise.  Follow lipid panel.

## 2021-02-01 NOTE — Assessment & Plan Note (Signed)
On thyroid replacement.  Follow tsh.  

## 2021-02-01 NOTE — Assessment & Plan Note (Signed)
On zoloft.  Appears to be handling stress well.   Follow.

## 2021-02-01 NOTE — Assessment & Plan Note (Signed)
Low carb diet and exercise.  Follow met b and a1c.  

## 2021-02-20 DIAGNOSIS — M5459 Other low back pain: Secondary | ICD-10-CM | POA: Diagnosis not present

## 2021-02-20 DIAGNOSIS — M170 Bilateral primary osteoarthritis of knee: Secondary | ICD-10-CM | POA: Diagnosis not present

## 2021-02-20 DIAGNOSIS — M25562 Pain in left knee: Secondary | ICD-10-CM | POA: Diagnosis not present

## 2021-02-20 DIAGNOSIS — M25561 Pain in right knee: Secondary | ICD-10-CM | POA: Diagnosis not present

## 2021-02-23 ENCOUNTER — Other Ambulatory Visit: Payer: Self-pay | Admitting: Internal Medicine

## 2021-02-23 DIAGNOSIS — M25562 Pain in left knee: Secondary | ICD-10-CM | POA: Diagnosis not present

## 2021-02-23 DIAGNOSIS — M25561 Pain in right knee: Secondary | ICD-10-CM | POA: Diagnosis not present

## 2021-02-23 DIAGNOSIS — M170 Bilateral primary osteoarthritis of knee: Secondary | ICD-10-CM | POA: Diagnosis not present

## 2021-02-23 DIAGNOSIS — M5459 Other low back pain: Secondary | ICD-10-CM | POA: Diagnosis not present

## 2021-02-25 DIAGNOSIS — M170 Bilateral primary osteoarthritis of knee: Secondary | ICD-10-CM | POA: Diagnosis not present

## 2021-02-25 DIAGNOSIS — M25561 Pain in right knee: Secondary | ICD-10-CM | POA: Diagnosis not present

## 2021-02-25 DIAGNOSIS — M25562 Pain in left knee: Secondary | ICD-10-CM | POA: Diagnosis not present

## 2021-03-09 DIAGNOSIS — M170 Bilateral primary osteoarthritis of knee: Secondary | ICD-10-CM | POA: Diagnosis not present

## 2021-03-14 ENCOUNTER — Other Ambulatory Visit: Payer: Self-pay | Admitting: Internal Medicine

## 2021-03-27 DIAGNOSIS — M170 Bilateral primary osteoarthritis of knee: Secondary | ICD-10-CM | POA: Diagnosis not present

## 2021-04-24 DIAGNOSIS — M170 Bilateral primary osteoarthritis of knee: Secondary | ICD-10-CM | POA: Diagnosis not present

## 2021-05-01 ENCOUNTER — Other Ambulatory Visit: Payer: Self-pay

## 2021-05-01 ENCOUNTER — Other Ambulatory Visit (INDEPENDENT_AMBULATORY_CARE_PROVIDER_SITE_OTHER): Payer: BC Managed Care – PPO

## 2021-05-01 DIAGNOSIS — R739 Hyperglycemia, unspecified: Secondary | ICD-10-CM | POA: Diagnosis not present

## 2021-05-01 DIAGNOSIS — E7800 Pure hypercholesterolemia, unspecified: Secondary | ICD-10-CM

## 2021-05-01 LAB — HEPATIC FUNCTION PANEL
ALT: 28 U/L (ref 0–35)
AST: 27 U/L (ref 0–37)
Albumin: 4.3 g/dL (ref 3.5–5.2)
Alkaline Phosphatase: 67 U/L (ref 39–117)
Bilirubin, Direct: 0.1 mg/dL (ref 0.0–0.3)
Total Bilirubin: 0.6 mg/dL (ref 0.2–1.2)
Total Protein: 7.1 g/dL (ref 6.0–8.3)

## 2021-05-01 LAB — BASIC METABOLIC PANEL
BUN: 16 mg/dL (ref 6–23)
CO2: 29 mEq/L (ref 19–32)
Calcium: 9.3 mg/dL (ref 8.4–10.5)
Chloride: 104 mEq/L (ref 96–112)
Creatinine, Ser: 0.95 mg/dL (ref 0.40–1.20)
GFR: 64.82 mL/min (ref >60.00)
Glucose, Bld: 102 mg/dL — ABNORMAL HIGH (ref 70–99)
Potassium: 4.5 mEq/L (ref 3.5–5.1)
Sodium: 138 mEq/L (ref 135–145)

## 2021-05-01 LAB — LIPID PANEL
Cholesterol: 210 mg/dL — ABNORMAL HIGH (ref 0–200)
HDL: 54.7 mg/dL (ref >39.00)
LDL Cholesterol: 121 mg/dL — ABNORMAL HIGH (ref 0–99)
NonHDL: 155.6
Total CHOL/HDL Ratio: 4
Triglycerides: 171 mg/dL — ABNORMAL HIGH (ref 0.0–149.0)
VLDL: 34.2 mg/dL (ref 0.0–40.0)

## 2021-05-01 LAB — HEMOGLOBIN A1C: Hgb A1c MFr Bld: 5.9 % (ref 4.6–6.5)

## 2021-05-05 ENCOUNTER — Ambulatory Visit: Payer: BC Managed Care – PPO | Admitting: Internal Medicine

## 2021-05-05 ENCOUNTER — Other Ambulatory Visit: Payer: Self-pay

## 2021-05-05 VITALS — BP 122/78 | HR 61 | Temp 98.0°F | Resp 16 | Ht 66.0 in | Wt 202.0 lb

## 2021-05-05 DIAGNOSIS — R944 Abnormal results of kidney function studies: Secondary | ICD-10-CM

## 2021-05-05 DIAGNOSIS — R739 Hyperglycemia, unspecified: Secondary | ICD-10-CM

## 2021-05-05 DIAGNOSIS — E039 Hypothyroidism, unspecified: Secondary | ICD-10-CM

## 2021-05-05 DIAGNOSIS — M25562 Pain in left knee: Secondary | ICD-10-CM

## 2021-05-05 DIAGNOSIS — M25561 Pain in right knee: Secondary | ICD-10-CM

## 2021-05-05 DIAGNOSIS — E7800 Pure hypercholesterolemia, unspecified: Secondary | ICD-10-CM | POA: Diagnosis not present

## 2021-05-05 DIAGNOSIS — F439 Reaction to severe stress, unspecified: Secondary | ICD-10-CM

## 2021-05-05 NOTE — Progress Notes (Signed)
Patient ID: Carol Jimenez, female   DOB: 1961/05/17, 60 y.o.   MRN: [id number]   Subjective:    Patient ID: Carol Jimenez, female    DOB: 1961/03/24, 60 y.o.   MRN: [id number]  This visit occurred during the SARS-CoV-2 public health emergency.  Safety protocols were in place, including screening questions prior to the visit, additional usage of staff PPE, and extensive cleaning of exam room while observing appropriate contact time as indicated for disinfecting solutions.   Patient here for a scheduled follow up.  Chief Complaint  Patient presents with   Hypertension   Gastroesophageal Reflux   Hypothyroidism   .   HPI Still having issues with her knees.  Limits her activity.  S/p injections.  PT - stretches.  On meloxicam 25m q day.  Had questions about decreasing GFR.  GFR 64 on recent labs.  Discussed trying to limit antiinflammatories.  No chest pain or sob reported.  No abdominal pain or bowel change reported.  Discussed labs.  Appears to be handing stress.     Past Medical History:  Diagnosis Date   Allergy    Hx: UTI (urinary tract infection)    Hypothyroidism    Past Surgical History:  Procedure Laterality Date   BREAST BIOPSY Left 2015   negative, stereotactic biopsy   CESAREAN SECTION     ENDOMETRIAL ABLATION  2000   KNEE SURGERY Left    Family History  Problem Relation Age of Onset   Congestive Heart Failure Mother    Diabetes Mother    COPD Mother    Hypertension Father    Esophageal cancer Father 68      treated at [facility]  Colon polyps Other    Hypertension Sister    Hypertension Sister    Diabetes Sister    Breast cancer Neg Hx    Social History   Socioeconomic History   Marital status: Married    Spouse name: Not on file   Number of children: Not on file   Years of education: Not on file   Highest education level: Not on file  Occupational History   Not on file  Tobacco Use   Smoking status: Never   Smokeless tobacco: Never  Vaping Use    Vaping Use: Never used  Substance and Sexual Activity   Alcohol use: Yes    Alcohol/week: 0.0 standard drinks    Comment: rare   Drug use: No   Sexual activity: Not on file  Other Topics Concern   Not on file  Social History Narrative   Not on file   Social Determinants of Health   Financial Resource Strain: Not on file  Food Insecurity: Not on file  Transportation Needs: Not on file  Physical Activity: Not on file  Stress: Not on file  Social Connections: Not on file     Review of Systems  Constitutional:  Negative for appetite change and unexpected weight change.  HENT:  Negative for congestion and sinus pressure.   Respiratory:  Negative for cough, chest tightness and shortness of breath.   Cardiovascular:  Negative for chest pain, palpitations and leg swelling.  Gastrointestinal:  Negative for abdominal pain, diarrhea, nausea and vomiting.  Genitourinary:  Negative for difficulty urinating and dysuria.  Musculoskeletal:  Negative for myalgias.       Knee pain as outlined.   Skin:  Negative for color change and rash.  Neurological:  Negative for dizziness, light-headedness and headaches.  Psychiatric/Behavioral:  Negative for agitation and dysphoric mood.       Objective:     BP 122/78   Pulse 61   Temp 98 F (36.7 C)   Resp 16   Ht '5\' 6"'  (1.676 m)   Wt 202 lb (91.6 kg)   LMP 12/26/2012   SpO2 98%   BMI 32.60 kg/m  Wt Readings from Last 3 Encounters:  05/05/21 202 lb (91.6 kg)  01/27/21 202 lb 6.4 oz (91.8 kg)  11/27/20 200 lb (90.7 kg)    Physical Exam Vitals reviewed.  Constitutional:      General: She is not in acute distress.    Appearance: Normal appearance.  HENT:     Head: Normocephalic and atraumatic.     Right Ear: External ear normal.     Left Ear: External ear normal.  Eyes:     General: No scleral icterus.       Right eye: No discharge.        Left eye: No discharge.     Conjunctiva/sclera: Conjunctivae normal.  Neck:     Thyroid:  No thyromegaly.  Cardiovascular:     Rate and Rhythm: Normal rate and regular rhythm.  Pulmonary:     Effort: No respiratory distress.     Breath sounds: Normal breath sounds. No wheezing.  Abdominal:     General: Bowel sounds are normal.     Palpations: Abdomen is soft.     Tenderness: There is no abdominal tenderness.  Musculoskeletal:        General: No swelling or tenderness.     Cervical back: Neck supple. No tenderness.  Lymphadenopathy:     Cervical: No cervical adenopathy.  Skin:    Findings: No erythema or rash.  Neurological:     Mental Status: She is alert.  Psychiatric:        Mood and Affect: Mood normal.        Behavior: Behavior normal.     Outpatient Encounter Medications as of 05/05/2021  Medication Sig   ARMOUR THYROID 90 MG tablet TAKE 1 TABLET(90 MG) BY MOUTH DAILY   estradiol (ESTRACE) 0.1 MG/GM vaginal cream INSERT 1 APPLICATORFUL VAGINALLY AT BEDTIME FOR 5 DAYS THEN USE VAGINALLY 2 TIMES A WEEK   estradiol (ESTRACE) 0.5 MG tablet TAKE 1 TO 2 TABLETS BY MOUTH EVERY DAY   fluticasone (FLONASE) 50 MCG/ACT nasal spray Place 2 sprays into both nostrils daily.   hydrochlorothiazide (HYDRODIURIL) 25 MG tablet TAKE 1 TABLET(25 MG) BY MOUTH DAILY   meloxicam (MOBIC) 7.5 MG tablet Take 7.5 mg by mouth daily as needed.   pantoprazole (PROTONIX) 40 MG tablet TAKE 1 TABLET(40 MG) BY MOUTH DAILY 30 MINUTES BEFORE BREAKFAST   progesterone (PROMETRIUM) 100 MG capsule TAKE 1 CAPSULE(100 MG) BY MOUTH DAILY   sertraline (ZOLOFT) 50 MG tablet TAKE 1 TABLET(50 MG) BY MOUTH DAILY   tretinoin (RETIN-A) 0.025 % cream Apply 1 application topically as needed.   No facility-administered encounter medications on file as of 05/05/2021.     Lab Results  Component Value Date   WBC 6.6 07/29/2020   HGB 14.9 07/29/2020   HCT 44.1 07/29/2020   PLT 187.0 07/29/2020   GLUCOSE 102 (H) 05/01/2021   CHOL 210 (H) 05/01/2021   TRIG 171.0 (H) 05/01/2021   HDL 54.70 05/01/2021    LDLDIRECT 127.0 01/07/2021   LDLCALC 121 (H) 05/01/2021   ALT 28 05/01/2021   AST 27 05/01/2021   NA 138 05/01/2021   K 4.5 05/01/2021  CL 104 05/01/2021   CREATININE 0.95 05/01/2021   BUN 16 05/01/2021   CO2 29 05/01/2021   TSH 1.65 01/07/2021   HGBA1C 5.9 05/01/2021       Assessment & Plan:   Problem List Items Addressed This Visit     Hypercholesterolemia    The 10-year ASCVD risk score (Arnett DK, et al., 2019) is: 3.5%   Values used to calculate the score:     Age: 62 years     Sex: Female     Is Non-Hispanic African American: No     Diabetic: No     Tobacco smoker: No     Systolic Blood Pressure: 961 mmHg     Is BP treated: No     HDL Cholesterol: 54.7 mg/dL     Total Cholesterol: 210 mg/dL  Low cholesterol diet and exercise.        Hyperglycemia - Primary    Low carb diet and exercise.  Follow met b and a1c.  Lab Results  Component Value Date   HGBA1C 5.9 05/01/2021       Hypothyroid    On thyroid replacement.  Follow tsh.       Knee pain, bilateral    Seeing ortho.  On meloxicam.  Discussed GFR 64.  Plans to decrease to qod.  Can take tylenol.  Follow. Plan for f/u met b to confirm stable.       Stress    On zoloft.  Appears to be handling stress well.   Follow.        Other Visit Diagnoses     Decreased GFR       Relevant Orders   Basic metabolic panel   Urinalysis, Routine w reflex microscopic        Einar Pheasant, MD

## 2021-05-10 ENCOUNTER — Encounter: Payer: Self-pay | Admitting: Internal Medicine

## 2021-05-10 NOTE — Assessment & Plan Note (Signed)
Seeing ortho.  On meloxicam.  Discussed GFR 64.  Plans to decrease to qod.  Can take tylenol.  Follow. Plan for f/u met b to confirm stable.

## 2021-05-10 NOTE — Assessment & Plan Note (Signed)
On thyroid replacement.  Follow tsh.  

## 2021-05-10 NOTE — Assessment & Plan Note (Signed)
On zoloft.  Appears to be handling stress well.   Follow.

## 2021-05-10 NOTE — Assessment & Plan Note (Signed)
The 10-year ASCVD risk score (Arnett DK, et al., 2019) is: 3.5%   Values used to calculate the score:     Age: 60 years     Sex: Female     Is Non-Hispanic African American: No     Diabetic: No     Tobacco smoker: No     Systolic Blood Pressure: 122 mmHg     Is BP treated: No     HDL Cholesterol: 54.7 mg/dL     Total Cholesterol: 210 mg/dL  Low cholesterol diet and exercise.

## 2021-05-10 NOTE — Assessment & Plan Note (Signed)
Low carb diet and exercise.  Follow met b and a1c.  Lab Results  Component Value Date   HGBA1C 5.9 05/01/2021

## 2021-05-27 ENCOUNTER — Other Ambulatory Visit (INDEPENDENT_AMBULATORY_CARE_PROVIDER_SITE_OTHER): Payer: BC Managed Care – PPO

## 2021-05-27 ENCOUNTER — Other Ambulatory Visit: Payer: Self-pay

## 2021-05-27 DIAGNOSIS — R944 Abnormal results of kidney function studies: Secondary | ICD-10-CM

## 2021-05-27 LAB — URINALYSIS, ROUTINE W REFLEX MICROSCOPIC
Bilirubin Urine: NEGATIVE
Hgb urine dipstick: NEGATIVE
Ketones, ur: NEGATIVE
Leukocytes,Ua: NEGATIVE
Nitrite: NEGATIVE
Specific Gravity, Urine: 1.015 (ref 1.000–1.030)
Total Protein, Urine: NEGATIVE
Urine Glucose: NEGATIVE
Urobilinogen, UA: 0.2 (ref 0.0–1.0)
pH: 6 (ref 5.0–8.0)

## 2021-05-27 LAB — BASIC METABOLIC PANEL
BUN: 19 mg/dL (ref 6–23)
CO2: 32 mEq/L (ref 19–32)
Calcium: 10 mg/dL (ref 8.4–10.5)
Chloride: 100 mEq/L (ref 96–112)
Creatinine, Ser: 0.67 mg/dL (ref 0.40–1.20)
GFR: 94.46 mL/min (ref >60.00)
Glucose, Bld: 96 mg/dL (ref 70–99)
Potassium: 4.1 mEq/L (ref 3.5–5.1)
Sodium: 138 mEq/L (ref 135–145)

## 2021-06-15 ENCOUNTER — Other Ambulatory Visit: Payer: Self-pay | Admitting: Internal Medicine

## 2021-07-17 ENCOUNTER — Other Ambulatory Visit: Payer: Self-pay | Admitting: Internal Medicine

## 2021-08-06 ENCOUNTER — Other Ambulatory Visit: Payer: Self-pay | Admitting: Internal Medicine

## 2021-08-06 DIAGNOSIS — Z1231 Encounter for screening mammogram for malignant neoplasm of breast: Secondary | ICD-10-CM

## 2021-08-14 ENCOUNTER — Other Ambulatory Visit: Payer: Self-pay | Admitting: Internal Medicine

## 2021-09-01 ENCOUNTER — Telehealth: Payer: Self-pay | Admitting: Internal Medicine

## 2021-09-01 DIAGNOSIS — E7800 Pure hypercholesterolemia, unspecified: Secondary | ICD-10-CM

## 2021-09-01 DIAGNOSIS — R739 Hyperglycemia, unspecified: Secondary | ICD-10-CM

## 2021-09-01 NOTE — Telephone Encounter (Signed)
Requesting labs before physical on 09/16/21 ?

## 2021-09-03 NOTE — Telephone Encounter (Signed)
Labs ordered.

## 2021-09-03 NOTE — Addendum Note (Signed)
Addended by: Larry Sierras on: 09/03/2021 09:23 AM ? ? Modules accepted: Orders ? ?

## 2021-09-07 ENCOUNTER — Encounter: Payer: BC Managed Care – PPO | Admitting: Internal Medicine

## 2021-09-09 ENCOUNTER — Other Ambulatory Visit: Payer: Self-pay | Admitting: Internal Medicine

## 2021-09-10 ENCOUNTER — Other Ambulatory Visit: Payer: Self-pay | Admitting: Internal Medicine

## 2021-09-11 ENCOUNTER — Ambulatory Visit
Admission: RE | Admit: 2021-09-11 | Discharge: 2021-09-11 | Disposition: A | Discharge status: Home / Self Care | Payer: BC Managed Care – PPO | Source: Ambulatory Visit | Attending: Internal Medicine | Admitting: Internal Medicine

## 2021-09-11 ENCOUNTER — Other Ambulatory Visit: Payer: Self-pay

## 2021-09-11 DIAGNOSIS — Z1231 Encounter for screening mammogram for malignant neoplasm of breast: Secondary | ICD-10-CM | POA: Diagnosis present

## 2021-09-14 ENCOUNTER — Other Ambulatory Visit: Payer: Self-pay

## 2021-09-14 ENCOUNTER — Other Ambulatory Visit (INDEPENDENT_AMBULATORY_CARE_PROVIDER_SITE_OTHER): Payer: BC Managed Care – PPO

## 2021-09-14 DIAGNOSIS — E7800 Pure hypercholesterolemia, unspecified: Secondary | ICD-10-CM | POA: Diagnosis not present

## 2021-09-14 DIAGNOSIS — R739 Hyperglycemia, unspecified: Secondary | ICD-10-CM | POA: Diagnosis not present

## 2021-09-14 LAB — CBC WITH DIFFERENTIAL/PLATELET
Basophils Absolute: 0.1 10*3/uL (ref 0.0–0.1)
Basophils Relative: 1.1 % (ref 0.0–3.0)
Eosinophils Absolute: 0.1 10*3/uL (ref 0.0–0.7)
Eosinophils Relative: 1.4 % (ref 0.0–5.0)
HCT: 44.6 % (ref 36.0–46.0)
Hemoglobin: 15.2 g/dL — ABNORMAL HIGH (ref 12.0–15.0)
Lymphocytes Relative: 22.7 % (ref 12.0–46.0)
Lymphs Abs: 1.4 10*3/uL (ref 0.7–4.0)
MCHC: 34 g/dL (ref 30.0–36.0)
MCV: 94.4 fl (ref 78.0–100.0)
Monocytes Absolute: 0.5 10*3/uL (ref 0.1–1.0)
Monocytes Relative: 8 % (ref 3.0–12.0)
Neutro Abs: 4.2 10*3/uL (ref 1.4–7.7)
Neutrophils Relative %: 66.8 % (ref 43.0–77.0)
Platelets: 201 10*3/uL (ref 150.0–400.0)
RBC: 4.72 Mil/uL (ref 3.87–5.11)
RDW: 13.1 % (ref 11.5–15.5)
WBC: 6.3 10*3/uL (ref 4.0–10.5)

## 2021-09-14 LAB — BASIC METABOLIC PANEL
BUN: 17 mg/dL (ref 6–23)
CO2: 29 mEq/L (ref 19–32)
Calcium: 9.7 mg/dL (ref 8.4–10.5)
Chloride: 99 mEq/L (ref 96–112)
Creatinine, Ser: 0.75 mg/dL (ref 0.40–1.20)
GFR: 85.86 mL/min (ref >60.00)
Glucose, Bld: 105 mg/dL — ABNORMAL HIGH (ref 70–99)
Potassium: 4.3 mEq/L (ref 3.5–5.1)
Sodium: 137 mEq/L (ref 135–145)

## 2021-09-14 LAB — LIPID PANEL
Cholesterol: 227 mg/dL — ABNORMAL HIGH (ref 0–200)
HDL: 54.5 mg/dL (ref >39.00)
NonHDL: 172.51
Total CHOL/HDL Ratio: 4
Triglycerides: 268 mg/dL — ABNORMAL HIGH (ref 0.0–149.0)
VLDL: 53.6 mg/dL — ABNORMAL HIGH (ref 0.0–40.0)

## 2021-09-14 LAB — HEPATIC FUNCTION PANEL
ALT: 24 U/L (ref 0–35)
AST: 26 U/L (ref 0–37)
Albumin: 4.4 g/dL (ref 3.5–5.2)
Alkaline Phosphatase: 63 U/L (ref 39–117)
Bilirubin, Direct: 0.1 mg/dL (ref 0.0–0.3)
Total Bilirubin: 0.7 mg/dL (ref 0.2–1.2)
Total Protein: 7.1 g/dL (ref 6.0–8.3)

## 2021-09-14 LAB — HEMOGLOBIN A1C: Hgb A1c MFr Bld: 5.9 % (ref 4.6–6.5)

## 2021-09-14 LAB — LDL CHOLESTEROL, DIRECT: Direct LDL: 152 mg/dL

## 2021-09-16 ENCOUNTER — Other Ambulatory Visit: Payer: Self-pay

## 2021-09-16 ENCOUNTER — Other Ambulatory Visit (HOSPITAL_COMMUNITY)
Admission: RE | Admit: 2021-09-16 | Discharge: 2021-09-16 | Disposition: A | Discharge status: Home / Self Care | Payer: BC Managed Care – PPO | Source: Ambulatory Visit | Attending: Internal Medicine | Admitting: Internal Medicine

## 2021-09-16 ENCOUNTER — Ambulatory Visit (INDEPENDENT_AMBULATORY_CARE_PROVIDER_SITE_OTHER): Payer: BC Managed Care – PPO | Admitting: Internal Medicine

## 2021-09-16 VITALS — BP 132/78 | HR 73 | Temp 97.9°F | Resp 16 | Ht 66.0 in | Wt 204.6 lb

## 2021-09-16 DIAGNOSIS — Z01818 Encounter for other preprocedural examination: Secondary | ICD-10-CM | POA: Diagnosis not present

## 2021-09-16 DIAGNOSIS — R0981 Nasal congestion: Secondary | ICD-10-CM

## 2021-09-16 DIAGNOSIS — E7800 Pure hypercholesterolemia, unspecified: Secondary | ICD-10-CM

## 2021-09-16 DIAGNOSIS — Z124 Encounter for screening for malignant neoplasm of cervix: Secondary | ICD-10-CM | POA: Insufficient documentation

## 2021-09-16 DIAGNOSIS — Z0000 Encounter for general adult medical examination without abnormal findings: Secondary | ICD-10-CM | POA: Diagnosis not present

## 2021-09-16 DIAGNOSIS — K219 Gastro-esophageal reflux disease without esophagitis: Secondary | ICD-10-CM

## 2021-09-16 DIAGNOSIS — E039 Hypothyroidism, unspecified: Secondary | ICD-10-CM

## 2021-09-16 DIAGNOSIS — F439 Reaction to severe stress, unspecified: Secondary | ICD-10-CM

## 2021-09-16 DIAGNOSIS — N6489 Other specified disorders of breast: Secondary | ICD-10-CM

## 2021-09-16 DIAGNOSIS — R739 Hyperglycemia, unspecified: Secondary | ICD-10-CM

## 2021-09-16 NOTE — Progress Notes (Signed)
Patient ID: Carol Jimenez, female   DOB: 08-22-1960, 61 y.o.   MRN: [id number] ? ? ?Subjective:  ? ? Patient ID: Carol Jimenez, female    DOB: 11-29-1960, 61 y.o.   MRN: [id number] ? ?This visit occurred during the SARS-CoV-2 public health emergency.  Safety protocols were in place, including screening questions prior to the visit, additional usage of staff PPE, and extensive cleaning of exam room while observing appropriate contact time as indicated for disinfecting solutions.  ? ?Patient here for her physical exam.   ? ?Chief Complaint  ?Patient presents with  ? Annual Exam  ? .  ? ?HPI ?She is planning to have knee surgery 09/30/21 - Dr Odis Luster.  Reports no chest pain or sob.  No cough or congestion.  She does report that starting two days ago, she noticed some allergy symptoms.  Minimal nasal congestion and drainage/dripping.  Discussed using flonase and saline nasal spray.  Also discussed mucinex and robitussin.  No chest congestion.  No acid reflux.  No abdominal pain.  Bowels moving.   ? ? ?Past Medical History:  ?Diagnosis Date  ? Allergy   ? Hx: UTI (urinary tract infection)   ? Hypothyroidism   ? ?Past Surgical History:  ?Procedure Laterality Date  ? BREAST BIOPSY Left 2015  ? negative, stereotactic biopsy  ? CESAREAN SECTION    ? ENDOMETRIAL ABLATION  2000  ? KNEE SURGERY Left   ? ?Family History  ?Problem Relation Age of Onset  ? Congestive Heart Failure Mother   ? Diabetes Mother   ? COPD Mother   ? Hypertension Father   ? Esophageal cancer Father 28  ?     treated at [facility]  ? Colon polyps Other   ? Hypertension Sister   ? Hypertension Sister   ? Diabetes Sister   ? Breast cancer Neg Hx   ? ?Social History  ? ?Socioeconomic History  ? Marital status: Married  ?  Spouse name: Not on file  ? Number of children: Not on file  ? Years of education: Not on file  ? Highest education level: Not on file  ?Occupational History  ? Not on file  ?Tobacco Use  ? Smoking status: Never  ? Smokeless tobacco: Never   ?Vaping Use  ? Vaping Use: Never used  ?Substance and Sexual Activity  ? Alcohol use: Yes  ?  Alcohol/week: 0.0 standard drinks  ?  Comment: rare  ? Drug use: No  ? Sexual activity: Not on file  ?Other Topics Concern  ? Not on file  ?Social History Narrative  ? Not on file  ? ?Social Determinants of Health  ? ?Financial Resource Strain: Not on file  ?Food Insecurity: Not on file  ?Transportation Needs: Not on file  ?Physical Activity: Not on file  ?Stress: Not on file  ?Social Connections: Not on file  ? ? ? ?Review of Systems  ?Constitutional:  Negative for appetite change and unexpected weight change.  ?HENT:  Negative for sinus pressure and sore throat.   ?     Minimal allergy symptoms.   ?Eyes:  Negative for pain and visual disturbance.  ?Respiratory:  Negative for cough, chest tightness and shortness of breath.   ?Cardiovascular:  Negative for chest pain, palpitations and leg swelling.  ?Gastrointestinal:  Negative for abdominal pain, diarrhea, nausea and vomiting.  ?Genitourinary:  Negative for difficulty urinating and dysuria.  ?Musculoskeletal:  Negative for myalgias.  ?     Knee pain as outlined.   ?  Skin:  Negative for color change and rash.  ?Neurological:  Negative for dizziness, light-headedness and headaches.  ?Hematological:  Negative for adenopathy. Does not bruise/bleed easily.  ?Psychiatric/Behavioral:  Negative for agitation and dysphoric mood.   ? ?   ?Objective:  ?  ? ?BP 132/78   Pulse 73   Temp 97.9 ?F (36.6 ?C)   Resp 16   Ht 5\' 6"  (1.676 m)   Wt 204 lb 9.6 oz (92.8 kg)   LMP 12/26/2012   SpO2 99%   BMI 33.02 kg/m?  ?Wt Readings from Last 3 Encounters:  ?09/16/21 204 lb 9.6 oz (92.8 kg)  ?05/05/21 202 lb (91.6 kg)  ?01/27/21 202 lb 6.4 oz (91.8 kg)  ? ? ?Physical Exam ?Vitals reviewed.  ?Constitutional:   ?   General: She is not in acute distress. ?   Appearance: Normal appearance. She is well-developed.  ?HENT:  ?   Head: Normocephalic and atraumatic.  ?   Right Ear: External ear  normal.  ?   Left Ear: External ear normal.  ?Eyes:  ?   General: No scleral icterus.    ?   Right eye: No discharge.     ?   Left eye: No discharge.  ?   Conjunctiva/sclera: Conjunctivae normal.  ?Neck:  ?   Thyroid: No thyromegaly.  ?Cardiovascular:  ?   Rate and Rhythm: Normal rate and regular rhythm.  ?Pulmonary:  ?   Effort: No tachypnea, accessory muscle usage or respiratory distress.  ?   Breath sounds: Normal breath sounds. No decreased breath sounds or wheezing.  ?Chest:  ?Breasts: ?   Right: No inverted nipple, mass, nipple discharge or tenderness (no axillary adenopathy).  ?   Left: No inverted nipple, mass, nipple discharge or tenderness (no axilarry adenopathy).  ?Abdominal:  ?   General: Bowel sounds are normal.  ?   Palpations: Abdomen is soft.  ?   Tenderness: There is no abdominal tenderness.  ?Genitourinary: ?   Comments: Normal external genitalia.  Vaginal vault without lesions.  Cervix identified.  Pap smear performed.  Could not appreciate any adnexal masses or tenderness.   ?Musculoskeletal:     ?   General: No swelling or tenderness.  ?   Cervical back: Neck supple.  ?Lymphadenopathy:  ?   Cervical: No cervical adenopathy.  ?Skin: ?   Findings: No erythema or rash.  ?Neurological:  ?   Mental Status: She is alert and oriented to person, place, and time.  ?Psychiatric:     ?   Mood and Affect: Mood normal.     ?   Behavior: Behavior normal.  ? ? ? ?Outpatient Encounter Medications as of 09/16/2021  ?Medication Sig  ? ARMOUR THYROID 90 MG tablet TAKE 1 TABLET(90 MG) BY MOUTH DAILY  ? estradiol (ESTRACE) 0.1 MG/GM vaginal cream INSERT 1 APPLICATORFUL VAGINALLY 2 TIMES A WEEK  ? estradiol (ESTRACE) 0.5 MG tablet TAKE 1 TO 2 TABLETS BY MOUTH EVERY DAY  ? fluticasone (FLONASE) 50 MCG/ACT nasal spray Place 2 sprays into both nostrils daily.  ? hydrochlorothiazide (HYDRODIURIL) 25 MG tablet TAKE 1 TABLET(25 MG) BY MOUTH DAILY  ? meloxicam (MOBIC) 7.5 MG tablet Take 7.5 mg by mouth daily as needed.  ?  pantoprazole (PROTONIX) 40 MG tablet TAKE 1 TABLET(40 MG) BY MOUTH DAILY 30 MINUTES BEFORE BREAKFAST  ? progesterone (PROMETRIUM) 100 MG capsule TAKE 1 CAPSULE(100 MG) BY MOUTH DAILY  ? sertraline (ZOLOFT) 50 MG tablet TAKE 1 TABLET(50 MG) BY MOUTH  DAILY  ? tretinoin (RETIN-A) 0.025 % cream Apply 1 application topically as needed.  ? ?No facility-administered encounter medications on file as of 09/16/2021.  ?  ? ?Lab Results  ?Component Value Date  ? WBC 6.3 09/14/2021  ? HGB 15.2 (H) 09/14/2021  ? HCT 44.6 09/14/2021  ? PLT 201.0 09/14/2021  ? GLUCOSE 105 (H) 09/14/2021  ? CHOL 227 (H) 09/14/2021  ? TRIG 268.0 (H) 09/14/2021  ? HDL 54.50 09/14/2021  ? LDLDIRECT 152.0 09/14/2021  ? LDLCALC 121 (H) 05/01/2021  ? ALT 24 09/14/2021  ? AST 26 09/14/2021  ? NA 137 09/14/2021  ? K 4.3 09/14/2021  ? CL 99 09/14/2021  ? CREATININE 0.75 09/14/2021  ? BUN 17 09/14/2021  ? CO2 29 09/14/2021  ? TSH 1.65 01/07/2021  ? HGBA1C 5.9 09/14/2021  ? ? ?MM 3D SCREEN BREAST BILATERAL ? ?Result Date: 09/14/2021 ?CLINICAL DATA:  Screening. EXAM: DIGITAL SCREENING BILATERAL MAMMOGRAM WITH TOMOSYNTHESIS AND CAD TECHNIQUE: Bilateral screening digital craniocaudal and mediolateral oblique mammograms were obtained. Bilateral screening digital breast tomosynthesis was performed. The images were evaluated with computer-aided detection. COMPARISON:  Previous exam(s). ACR Breast Density Category b: There are scattered areas of fibroglandular density. FINDINGS: There are no findings suspicious for malignancy. IMPRESSION: No mammographic evidence of malignancy. A result letter of this screening mammogram will be mailed directly to the patient. RECOMMENDATION: Screening mammogram in one year. (Code:SM-B-01Y) BI-RADS CATEGORY  1: Negative. Electronically Signed   By: Frederico HammanMichelle  Collins M.D.   On: 09/14/2021 09:27  ? ? ?   ?Assessment & Plan:  ? ?Problem List Items Addressed This Visit   ? ? Acid reflux  ?  Protonix.  No increased symptoms reported.   ?  ?   ? Congestion of nasal sinus  ?  Minimal allergy symptoms.  No fever.  No sinus pressure.  No chest congestion or cough.  flonase nasal spray and saline nasal spray as directed.  Robitussin prn.  Follow.  Call if persi

## 2021-09-16 NOTE — Assessment & Plan Note (Addendum)
Physical today 09/16/21. PAP 09/16/21.  Colonoscopy 09/2016 - TA.  F/u in 5 years. Will need colonoscopy after knee surgery.  Mammogram 09/11/21 - Birads I.  ?

## 2021-09-17 ENCOUNTER — Encounter: Payer: Self-pay | Admitting: Internal Medicine

## 2021-09-17 DIAGNOSIS — Z01818 Encounter for other preprocedural examination: Secondary | ICD-10-CM | POA: Insufficient documentation

## 2021-09-17 NOTE — Assessment & Plan Note (Signed)
The 10-year ASCVD risk score (Arnett DK, et al., 2019) is: 4.3% ?  Values used to calculate the score: ?    Age: 61 years ?    Sex: Female ?    Is Non-Hispanic African American: No ?    Diabetic: No ?    Tobacco smoker: No ?    Systolic Blood Pressure: 132 mmHg ?    Is BP treated: No ?    HDL Cholesterol: 54.5 mg/dL ?    Total Cholesterol: 227 mg/dL  ?Low cholesterol/low carb diet.  Follow lipid panel.  ?

## 2021-09-17 NOTE — Assessment & Plan Note (Signed)
Protonix.  No increased symptoms reported.   

## 2021-09-17 NOTE — Assessment & Plan Note (Signed)
Continue zoloft.  Appears to be handling things well.  Follow.  

## 2021-09-17 NOTE — Assessment & Plan Note (Signed)
Minimal allergy symptoms.  No fever.  No sinus pressure.  No chest congestion or cough.  flonase nasal spray and saline nasal spray as directed.  Robitussin prn.  Follow.  Call if persistent or worsening symptoms.  ?

## 2021-09-17 NOTE — Assessment & Plan Note (Signed)
Previously saw Dr Byrnett.  Mammogram 09/14/21 Birads I.  

## 2021-09-17 NOTE — Assessment & Plan Note (Signed)
Low carb diet and exercise.  Follow met b and a1c.  ?Lab Results  ?Component Value Date  ? HGBA1C 5.9 09/14/2021  ? ?

## 2021-09-17 NOTE — Assessment & Plan Note (Signed)
Planning for knee surgery.  EKG - SR with no acute ischemic changes.  Denies any chest pain or sob.  Given no symptoms and EKG -  Feel she is at low risk from a cardiac standpoint to proceed with planned surgery.  Will need close intra op and post op monitoring of heart rate and blood pressure to avoid extremes.   ?

## 2021-09-17 NOTE — Assessment & Plan Note (Signed)
On thyroid replacement.  Follow tsh.  

## 2021-09-18 LAB — CYTOLOGY - PAP
Comment: NEGATIVE
Diagnosis: NEGATIVE
High risk HPV: NEGATIVE

## 2021-09-24 ENCOUNTER — Telehealth: Payer: Self-pay | Admitting: Internal Medicine

## 2021-09-24 NOTE — Telephone Encounter (Signed)
Melissa from [facility] need recent lab work for pt. Fax number [phone]  ?

## 2021-09-25 NOTE — Telephone Encounter (Signed)
Faxed

## 2021-10-21 ENCOUNTER — Encounter: Payer: Self-pay | Admitting: Internal Medicine

## 2021-10-26 ENCOUNTER — Telehealth: Payer: BC Managed Care – PPO | Admitting: Physician Assistant

## 2021-10-26 DIAGNOSIS — R350 Frequency of micturition: Secondary | ICD-10-CM

## 2021-10-26 MED ORDER — CEPHALEXIN 500 MG PO CAPS: 500.0000 mg | ORAL_CAPSULE | Freq: Two times a day (BID) | ORAL | 0 refills | Status: AC

## 2021-10-26 NOTE — Progress Notes (Signed)

## 2021-11-02 ENCOUNTER — Other Ambulatory Visit: Payer: Self-pay | Admitting: Internal Medicine

## 2021-11-10 ENCOUNTER — Ambulatory Visit: Payer: BC Managed Care – PPO

## 2021-11-10 ENCOUNTER — Ambulatory Visit
Admission: EM | Admit: 2021-11-10 | Discharge: 2021-11-10 | Disposition: A | Discharge status: Home / Self Care | Payer: BC Managed Care – PPO | Attending: Emergency Medicine | Admitting: Emergency Medicine

## 2021-11-10 DIAGNOSIS — R112 Nausea with vomiting, unspecified: Secondary | ICD-10-CM | POA: Diagnosis not present

## 2021-11-10 DIAGNOSIS — J020 Streptococcal pharyngitis: Secondary | ICD-10-CM

## 2021-11-10 LAB — POCT RAPID STREP A (OFFICE): Rapid Strep A Screen: POSITIVE — AB

## 2021-11-10 MED ORDER — AMOXICILLIN 500 MG PO CAPS
500.0000 mg | ORAL_CAPSULE | Freq: Two times a day (BID) | ORAL | 0 refills | Status: AC
Start: 2021-11-10 — End: 2021-11-20

## 2021-11-10 MED ORDER — ONDANSETRON 4 MG PO TBDP: 4.0000 mg | ORAL_TABLET | Freq: Three times a day (TID) | ORAL | 0 refills | Status: DC | PRN

## 2021-11-10 NOTE — ED Provider Notes (Signed)
?[facility] ? ? ? ?CSN: [id number] ?Arrival date & time: 11/10/21  2334 ? ? ?  ? ?History   ?Chief Complaint ?Chief Complaint  ?Patient presents with  ? Sore Throat  ?  Nausea, headache and sinus congestion - Entered by patient  ? Headache  ? Nausea  ? ? ?HPI ?Carol Jimenez is a 61 y.o. female.  Patient presents with sore throat, headache, nausea since yesterday.  She vomited three times yesterday but none today so far.  Treatment at home with Tylenol.  No fever, rash, cough, shortness of breath, diarrhea, or other symptoms. ? ?The history is provided by the patient and medical records.  ? ?Past Medical History:  ?Diagnosis Date  ? Allergy   ? Hx: UTI (urinary tract infection)   ? Hypothyroidism   ? ? ?Patient Active Problem List  ? Diagnosis Date Noted  ? Pre-op evaluation 09/17/2021  ? Vaginal dryness 05/05/2020  ? Left ankle pain 02/04/2020  ? Acid reflux 11/19/2019  ? Change in hearing 11/19/2019  ? Chest pain 11/08/2019  ? Knee pain, bilateral 07/03/2019  ? Sinusitis 04/28/2019  ? Congestion of nasal sinus 04/22/2019  ? Toe pain, left 01/22/2018  ? Hyperglycemia 06/13/2016  ? Right knee pain 08/17/2015  ? Stress 03/23/2015  ? Health care maintenance 12/24/2014  ? History of non anemic vitamin B12 deficiency 12/23/2014  ? Vitamin D deficiency 12/23/2014  ? Rib pain on right side 12/23/2014  ? Pseudoangiomatous stromal hyperplasia of breast 08/12/2014  ? BMI 33.0-33.9,adult 04/13/2014  ? Abnormal liver function tests 04/13/2014  ? Hypothyroid 11/12/2013  ? Menopausal symptoms 11/12/2013  ? Side pain 11/12/2013  ? Fatigue 11/12/2013  ? Daytime somnolence 11/12/2013  ? Environmental allergies 11/12/2013  ? Hypercholesterolemia 11/12/2013  ? ? ?Past Surgical History:  ?Procedure Laterality Date  ? BREAST BIOPSY Left 2015  ? negative, stereotactic biopsy  ? CESAREAN SECTION    ? ENDOMETRIAL ABLATION  2000  ? KNEE SURGERY Left   ? ? ?OB History   ? ? Gravida  ?2  ? Para  ?2  ? Term  ?   ? Preterm  ?   ?  AB  ?   ? Living  ?2  ?  ? ? SAB  ?   ? IAB  ?   ? Ectopic  ?   ? Multiple  ?   ? Live Births  ?   ?   ?  ? Obstetric Comments  ?1st Menstrual Cycle:  11  ?1st Pregnancy:  27  ?  ? ?  ? ? ? ?Home Medications   ? ?Prior to Admission medications   ?Medication Sig Start Date End Date Taking? Authorizing Provider  ?amoxicillin (AMOXIL) 500 MG capsule Take 1 capsule (500 mg total) by mouth 2 (two) times daily for 10 days. 11/10/21 11/20/21 Yes Mickie Bail, NP  ?ondansetron (ZOFRAN-ODT) 4 MG disintegrating tablet Take 1 tablet (4 mg total) by mouth every 8 (eight) hours as needed for nausea or vomiting. 11/10/21  Yes Mickie Bail, NP  ?ARMOUR THYROID 90 MG tablet TAKE 1 TABLET(90 MG) BY MOUTH DAILY 08/14/21   Dale Lorenzo, MD  ?estradiol (ESTRACE) 0.1 MG/GM vaginal cream INSERT 1 APPLICATORFUL VAGINALLY 2 TIMES A WEEK 07/17/21   Dale [place], MD  ?estradiol (ESTRACE) 0.5 MG tablet TAKE 1 TO 2 TABLETS BY MOUTH EVERY DAY 06/15/21   Dale [place], MD  ?fluticasone (FLONASE) 50 MCG/ACT nasal spray Place 2 sprays into both nostrils daily.  06/27/20   Muthersbaugh, Dahlia ClientHannah, PA-C  ?hydrochlorothiazide (HYDRODIURIL) 25 MG tablet TAKE 1 TABLET(25 MG) BY MOUTH DAILY 08/14/21   Dale DurhamScott, Charlene, MD  ?meloxicam (MOBIC) 7.5 MG tablet Take 7.5 mg by mouth daily as needed. 01/20/20   [provider]  ?pantoprazole (PROTONIX) 40 MG tablet TAKE 1 TABLET(40 MG) BY MOUTH DAILY 30 MINUTES BEFORE BREAKFAST 11/02/21   Dale DurhamScott, Charlene, MD  ?progesterone (PROMETRIUM) 100 MG capsule TAKE 1 CAPSULE(100 MG) BY MOUTH DAILY 09/09/21   Dale DurhamScott, Charlene, MD  ?sertraline (ZOLOFT) 50 MG tablet TAKE 1 TABLET(50 MG) BY MOUTH DAILY 09/11/21   Dale DurhamScott, Charlene, MD  ?tretinoin (RETIN-A) 0.025 % cream Apply 1 application topically as needed. 11/21/19   [provider]  ? ? ?Family History ?Family History  ?Problem Relation Age of Onset  ? Congestive Heart Failure Mother   ? Diabetes Mother   ? COPD Mother   ? Hypertension Father   ? Esophageal  cancer Father [id number]  ?     [facility]  ? Colon polyps Other   ? Hypertension Sister   ? Hypertension Sister   ? Diabetes Sister   ? Breast cancer Neg Hx   ? ? ?Social History ?Social History  ? ?Tobacco Use  ? Smoking status: Never  ? Smokeless tobacco: Never  ?Vaping Use  ? Vaping Use: Never used  ?Substance Use Topics  ? Alcohol use: Yes  ?  Alcohol/week: 0.0 standard drinks  ?  Comment: rare  ? Drug use: No  ? ? ? ?Allergies   ?Sulfa antibiotics ? ? ?Review of Systems ?Review of Systems  ?Constitutional:  Negative for chills and fever.  ?HENT:  Positive for sore throat. Negative for ear pain.   ?Respiratory:  Negative for cough and shortness of breath.   ?Gastrointestinal:  Positive for nausea and vomiting. Negative for abdominal pain and diarrhea.  ?Skin:  Negative for color change and rash.  ?Neurological:  Positive for headaches.  ?All other systems reviewed and are negative. ? ? ?Physical Exam ?Triage Vital Signs ?ED Triage Vitals  ?Enc Vitals Group  ?   BP   ?   Pulse   ?   Resp   ?   Temp   ?   Temp src   ?   SpO2   ?   Weight   ?   Height   ?   Head Circumference   ?   Peak Flow   ?   Pain Score   ?   Pain Loc   ?   Pain Edu?   ?   Excl. in GC?   ? ?No data found. ? ?Updated Vital Signs ?BP (!) 154/98   Pulse (!) 118   Temp 98.1 ?F (36.7 ?C)   Resp 18   LMP 12/26/2012   SpO2 97%  ? ?Visual Acuity ?Right Eye Distance:   ?Left Eye Distance:   ?Bilateral Distance:   ? ?Right Eye Near:   ?Left Eye Near:    ?Bilateral Near:    ? ?Physical Exam ?Vitals and nursing note reviewed.  ?Constitutional:   ?   General: She is not in acute distress. ?   Appearance: She is well-developed. She is not ill-appearing.  ?HENT:  ?   Right Ear: Tympanic membrane normal.  ?   Left Ear: Tympanic membrane normal.  ?   Nose: Nose normal.  ?   Mouth/Throat:  ?   Mouth: Mucous membranes are moist.  ?   Pharynx:  Posterior oropharyngeal erythema present.  ?Cardiovascular:  ?   Rate and Rhythm: Normal rate and regular rhythm.  ?    Heart sounds: Normal heart sounds.  ?Pulmonary:  ?   Effort: Pulmonary effort is normal. No respiratory distress.  ?   Breath sounds: Normal breath sounds.  ?Abdominal:  ?   General: Bowel sounds are normal.  ?   Palpations: Abdomen is soft.  ?   Tenderness: There is no abdominal tenderness. There is no guarding or rebound.  ?Musculoskeletal:  ?   Cervical back: Neck supple.  ?Skin: ?   General: Skin is warm and dry.  ?Neurological:  ?   Mental Status: She is alert.  ?Psychiatric:     ?   Mood and Affect: Mood normal.     ?   Behavior: Behavior normal.  ? ? ? ?UC Treatments / Results  ?Labs ?(all labs ordered are listed, but only abnormal results are displayed) ?Labs Reviewed  ?POCT RAPID STREP A (OFFICE) - Abnormal; Notable for the following components:  ?    Result Value  ? Rapid Strep A Screen Positive (*)   ? All other components within normal limits  ? ? ?EKG ? ? ?Radiology ?No results found. ? ?Procedures ?Procedures (including critical care time) ? ?Medications Ordered in UC ?Medications - No data to display ? ?Initial Impression / Assessment and Plan / UC Course  ?I have reviewed the triage vital signs and the nursing notes. ? ?Pertinent labs & imaging results that were available during my care of the patient were reviewed by me and considered in my medical decision making (see chart for details). ? ?  ?Strep pharyngitis.  Nausea and vomiting.  Rapid strep positive.  Treating with amoxicillin.  Treating nausea and vomiting with Zofran.  Instructed patient to keep herself hydrated with clear liquids such as water or Gatorade.  ED precautions discussed.  Education provided on strep throat and on nausea and vomiting.  Instructed patient to follow up with her PCP if her symptoms are not improving.  She agrees to plan of care.  ? ? ? ?Final Clinical Impressions(s) / UC Diagnoses  ? ?Final diagnoses:  ?Strep pharyngitis  ?Nausea and vomiting, unspecified vomiting type  ? ? ? ?Discharge Instructions   ? ?  ?Take  the amoxicillin for strep throat.   ? ?Take the antinausea medication as directed.  Keep yourself hydrated with clear liquids, such as water and Gatorade.   ? ?Go to the emergency department if you have worsenin

## 2021-11-10 NOTE — Discharge Instructions (Addendum)
Take the amoxicillin for strep throat.   ? ?Take the antinausea medication as directed.  Keep yourself hydrated with clear liquids, such as water and Gatorade.   ? ?Go to the emergency department if you have worsening symptoms.   ? ?Follow up with your primary care provider if your symptoms are not improving.   ? ? ? ?

## 2021-11-10 NOTE — ED Triage Notes (Signed)
Patient presents to Urgent Care with complaints of sore throat, headache, nausea since yesterday. Not treating symptoms.  ?

## 2021-12-04 ENCOUNTER — Other Ambulatory Visit: Payer: Self-pay | Admitting: Internal Medicine

## 2021-12-19 ENCOUNTER — Encounter: Payer: Self-pay | Admitting: Internal Medicine

## 2021-12-19 DIAGNOSIS — M5417 Radiculopathy, lumbosacral region: Secondary | ICD-10-CM | POA: Insufficient documentation

## 2021-12-31 ENCOUNTER — Encounter: Payer: Self-pay | Admitting: Internal Medicine

## 2022-01-01 ENCOUNTER — Other Ambulatory Visit: Payer: Self-pay

## 2022-01-01 MED ORDER — SCOPOLAMINE 1 MG/3DAYS TD PT72: 1.0000 | MEDICATED_PATCH | TRANSDERMAL | 0 refills | Status: DC

## 2022-01-01 NOTE — Telephone Encounter (Signed)
Rx sent in for scopolamine patches.   

## 2022-01-13 ENCOUNTER — Other Ambulatory Visit (INDEPENDENT_AMBULATORY_CARE_PROVIDER_SITE_OTHER): Payer: BC Managed Care – PPO

## 2022-01-13 DIAGNOSIS — E7800 Pure hypercholesterolemia, unspecified: Secondary | ICD-10-CM

## 2022-01-13 DIAGNOSIS — R739 Hyperglycemia, unspecified: Secondary | ICD-10-CM | POA: Diagnosis not present

## 2022-01-13 DIAGNOSIS — E039 Hypothyroidism, unspecified: Secondary | ICD-10-CM

## 2022-01-13 LAB — HEPATIC FUNCTION PANEL
ALT: 29 U/L (ref 0–35)
AST: 34 U/L (ref 0–37)
Albumin: 4.3 g/dL (ref 3.5–5.2)
Alkaline Phosphatase: 66 U/L (ref 39–117)
Bilirubin, Direct: 0.1 mg/dL (ref 0.0–0.3)
Total Bilirubin: 0.6 mg/dL (ref 0.2–1.2)
Total Protein: 6.8 g/dL (ref 6.0–8.3)

## 2022-01-13 LAB — BASIC METABOLIC PANEL
BUN: 15 mg/dL (ref 6–23)
CO2: 28 mEq/L (ref 19–32)
Calcium: 9.1 mg/dL (ref 8.4–10.5)
Chloride: 100 mEq/L (ref 96–112)
Creatinine, Ser: 0.63 mg/dL (ref 0.40–1.20)
GFR: 95.45 mL/min (ref >60.00)
Glucose, Bld: 110 mg/dL — ABNORMAL HIGH (ref 70–99)
Potassium: 3.5 mEq/L (ref 3.5–5.1)
Sodium: 138 mEq/L (ref 135–145)

## 2022-01-13 LAB — LIPID PANEL
Cholesterol: 217 mg/dL — ABNORMAL HIGH (ref 0–200)
HDL: 48.6 mg/dL (ref >39.00)
NonHDL: 168.78
Total CHOL/HDL Ratio: 4
Triglycerides: 228 mg/dL — ABNORMAL HIGH (ref 0.0–149.0)
VLDL: 45.6 mg/dL — ABNORMAL HIGH (ref 0.0–40.0)

## 2022-01-13 LAB — TSH: TSH: 2.52 u[IU]/mL (ref 0.35–5.50)

## 2022-01-13 LAB — HEMOGLOBIN A1C: Hgb A1c MFr Bld: 6.2 % (ref 4.6–6.5)

## 2022-01-13 LAB — LDL CHOLESTEROL, DIRECT: Direct LDL: 144 mg/dL

## 2022-01-14 ENCOUNTER — Other Ambulatory Visit: Payer: Self-pay | Admitting: Internal Medicine

## 2022-01-18 ENCOUNTER — Encounter: Payer: Self-pay | Admitting: Internal Medicine

## 2022-01-18 ENCOUNTER — Ambulatory Visit
Admission: RE | Admit: 2022-01-18 | Discharge: 2022-01-18 | Disposition: A | Discharge status: Home / Self Care | Payer: BC Managed Care – PPO | Source: Ambulatory Visit | Attending: Internal Medicine | Admitting: Internal Medicine

## 2022-01-18 ENCOUNTER — Telehealth: Payer: Self-pay

## 2022-01-18 ENCOUNTER — Ambulatory Visit: Payer: BC Managed Care – PPO | Admitting: Internal Medicine

## 2022-01-18 ENCOUNTER — Other Ambulatory Visit: Payer: Self-pay | Admitting: Internal Medicine

## 2022-01-18 DIAGNOSIS — E039 Hypothyroidism, unspecified: Secondary | ICD-10-CM | POA: Diagnosis not present

## 2022-01-18 DIAGNOSIS — R7989 Other specified abnormal findings of blood chemistry: Secondary | ICD-10-CM

## 2022-01-18 DIAGNOSIS — F439 Reaction to severe stress, unspecified: Secondary | ICD-10-CM

## 2022-01-18 DIAGNOSIS — M5417 Radiculopathy, lumbosacral region: Secondary | ICD-10-CM

## 2022-01-18 DIAGNOSIS — E7800 Pure hypercholesterolemia, unspecified: Secondary | ICD-10-CM

## 2022-01-18 DIAGNOSIS — N6489 Other specified disorders of breast: Secondary | ICD-10-CM

## 2022-01-18 DIAGNOSIS — M7989 Other specified soft tissue disorders: Secondary | ICD-10-CM | POA: Diagnosis present

## 2022-01-18 DIAGNOSIS — K219 Gastro-esophageal reflux disease without esophagitis: Secondary | ICD-10-CM

## 2022-01-18 DIAGNOSIS — R739 Hyperglycemia, unspecified: Secondary | ICD-10-CM

## 2022-01-18 DIAGNOSIS — R600 Localized edema: Secondary | ICD-10-CM | POA: Diagnosis present

## 2022-01-18 MED ORDER — PANTOPRAZOLE SODIUM 20 MG PO TBEC: 20.0000 mg | DELAYED_RELEASE_TABLET | Freq: Every day | ORAL | 3 refills | Status: DC

## 2022-01-18 NOTE — Assessment & Plan Note (Addendum)
The 10-year ASCVD risk score (Arnett DK, et al., 2019) is: 5.4%   Values used to calculate the score:     Age: 61 years     Sex: Female     Is Non-Hispanic African American: No     Diabetic: No     Tobacco smoker: No     Systolic Blood Pressure: 126 mmHg     Is BP treated: Yes     HDL Cholesterol: 48.6 mg/dL     Total Cholesterol: 217 mg/dL  Low cholesterol diet and exercise.  Follow lipid panel.

## 2022-01-18 NOTE — Telephone Encounter (Signed)
LMTCB in regards to results.  

## 2022-01-18 NOTE — Progress Notes (Signed)
Patient ID: Carol Jimenez, female   DOB: 1961/03/10, 61 y.o.   MRN: [id number]   Subjective:    Patient ID: Carol Jimenez, female    DOB: 1960/12/21, 61 y.o.   MRN: [id number]  Patient here for a scheduled follow up.   HPI Here to follow up regarding her blood pressure, cholesterol and increased stress.  Had knee surgery approximately one year ago.  Some swelling related to her surgery and post surgery.  Was doing better.  Now with increased swelling right leg.  Planning to travel soon.  No increased erythema.  No chest pain.  Breathing stable.  No acid reflux on protonix 31m q day.  Discussed decreasing to 280mq day.  No abdominal pain.  Bowels moving.  Has been having issues with left sciatic pain.  Has had two rounds of prednisone.  Did not help.  Planning to f/u with ortho in 01/2022.  Did start on neurontin last week.  Does note pain is some better today.  Discussed PT.  Discussed stretches and exercise.     Past Medical History:  Diagnosis Date   Allergy    Hx: UTI (urinary tract infection)    Hypothyroidism    Past Surgical History:  Procedure Laterality Date   BREAST BIOPSY Left 2015   negative, stereotactic biopsy   CESAREAN SECTION     ENDOMETRIAL ABLATION  2000   KNEE SURGERY Left    Family History  Problem Relation Age of Onset   Congestive Heart Failure Mother    Diabetes Mother    COPD Mother    Hypertension Father    Esophageal cancer Father [id number]     treated at [facility]32 Colon polyps Other    Hypertension Sister    Hypertension Sister    Diabetes Sister    Breast cancer Neg Hx    Social History   Socioeconomic History   Marital status: Married    Spouse name: Not on file   Number of children: Not on file   Years of education: Not on file   Highest education level: Not on file  Occupational History   Not on file  Tobacco Use   Smoking status: Never   Smokeless tobacco: Never  Vaping Use   Vaping Use: Never used  Substance and Sexual Activity    Alcohol use: Yes    Alcohol/week: 0.0 standard drinks of alcohol    Comment: rare   Drug use: No   Sexual activity: Not on file  Other Topics Concern   Not on file  Social History Narrative   Not on file   Social Determinants of Health   Financial Resource Strain: Not on file  Food Insecurity: Not on file  Transportation Needs: Not on file  Physical Activity: Not on file  Stress: Not on file  Social Connections: Not on file     Review of Systems  Constitutional:  Negative for appetite change and unexpected weight change.  HENT:  Negative for congestion and sinus pressure.   Respiratory:  Negative for cough, chest tightness and shortness of breath.   Cardiovascular:  Positive for leg swelling. Negative for chest pain and palpitations.  Gastrointestinal:  Negative for abdominal pain, diarrhea, nausea and vomiting.  Genitourinary:  Negative for difficulty urinating and dysuria.  Musculoskeletal:  Negative for joint swelling and myalgias.       Sciatic pain as outlined.   Skin:  Negative for color change and rash.  Neurological:  Negative  for dizziness, light-headedness and headaches.  Psychiatric/Behavioral:  Negative for agitation and dysphoric mood.        Objective:     BP 126/66 (BP Location: Left Arm, Patient Position: Sitting, Cuff Size: Normal)   Pulse 70   Temp 98.6 F (37 C) (Oral)   Resp 18   Ht _0  (1.676 m)   Wt 206 lb 0.3 oz (93.5 kg)   LMP 12/26/2012   SpO2 99%   BMI 33.25 kg/m  Wt Readings from Last 3 Encounters:  01/18/22 206 lb 0.3 oz (93.5 kg)  09/16/21 204 lb 9.6 oz (92.8 kg)  05/05/21 202 lb (91.6 kg)    Physical Exam Vitals reviewed.  Constitutional:      General: She is not in acute distress.    Appearance: Normal appearance.  HENT:     Head: Normocephalic and atraumatic.     Right Ear: External ear normal.     Left Ear: External ear normal.  Eyes:     General: No scleral icterus.       Right eye: No discharge.        Left eye:  No discharge.     Conjunctiva/sclera: Conjunctivae normal.  Neck:     Thyroid: No thyromegaly.  Cardiovascular:     Rate and Rhythm: Normal rate and regular rhythm.  Pulmonary:     Effort: No respiratory distress.     Breath sounds: Normal breath sounds. No wheezing.  Abdominal:     General: Bowel sounds are normal.     Palpations: Abdomen is soft.     Tenderness: There is no abdominal tenderness.  Musculoskeletal:        General: No swelling or tenderness.     Cervical back: Neck supple. No tenderness.  Lymphadenopathy:     Cervical: No cervical adenopathy.  Skin:    Findings: No erythema or rash.  Neurological:     Mental Status: She is alert.  Psychiatric:        Mood and Affect: Mood normal.        Behavior: Behavior normal.      Outpatient Encounter Medications as of 01/18/2022  Medication Sig   ARMOUR THYROID 90 MG tablet TAKE 1 TABLET(90 MG) BY MOUTH DAILY   estradiol (ESTRACE) 0.1 MG/GM vaginal cream INSERT 1 APPLICATORFUL VAGINALLY 2 TIMES A WEEK   estradiol (ESTRACE) 0.5 MG tablet TAKE 1 TO 2 TABLETS BY MOUTH EVERY DAY   fluticasone (FLONASE) 50 MCG/ACT nasal spray Place 2 sprays into both nostrils daily.   gabapentin (NEURONTIN) 300 MG capsule Take 300 mg by mouth 2 (two) times daily.   hydrochlorothiazide (HYDRODIURIL) 25 MG tablet TAKE 1 TABLET(25 MG) BY MOUTH DAILY   meloxicam (MOBIC) 7.5 MG tablet Take 15 mg by mouth daily as needed for pain.   pantoprazole (PROTONIX) 20 MG tablet Take 1 tablet (20 mg total) by mouth daily.   progesterone (PROMETRIUM) 100 MG capsule TAKE 1 CAPSULE(100 MG) BY MOUTH DAILY   scopolamine (TRANSDERM-SCOP) 1 MG/3DAYS Place 1 patch (1.5 mg total) onto the skin every 3 (three) days.   sertraline (ZOLOFT) 50 MG tablet TAKE 1 TABLET(50 MG) BY MOUTH DAILY   tretinoin (RETIN-A) 0.025 % cream Apply 1 application topically as needed.   [DISCONTINUED] pantoprazole (PROTONIX) 40 MG tablet TAKE 1 TABLET(40 MG) BY MOUTH DAILY 30 MINUTES BEFORE  BREAKFAST   [DISCONTINUED] ondansetron (ZOFRAN-ODT) 4 MG disintegrating tablet Take 1 tablet (4 mg total) by mouth every 8 (eight) hours as needed for  nausea or vomiting.   No facility-administered encounter medications on file as of 01/18/2022.     Lab Results  Component Value Date   WBC 6.3 09/14/2021   HGB 15.2 (H) 09/14/2021   HCT 44.6 09/14/2021   PLT 201.0 09/14/2021   GLUCOSE 110 (H) 01/13/2022   CHOL 217 (H) 01/13/2022   TRIG 228.0 (H) 01/13/2022   HDL 48.60 01/13/2022   LDLDIRECT 144.0 01/13/2022   LDLCALC 121 (H) 05/01/2021   ALT 29 01/13/2022   AST 34 01/13/2022   NA 138 01/13/2022   K 3.5 01/13/2022   CL 100 01/13/2022   CREATININE 0.63 01/13/2022   BUN 15 01/13/2022   CO2 28 01/13/2022   TSH 2.52 01/13/2022   HGBA1C 6.2 01/13/2022       Assessment & Plan:   Problem List Items Addressed This Visit     Abnormal liver function tests    01/13/22 - liver panel wnl.       Acid reflux    Protonix.  No increased symptoms reported.  Controlled.  Will decrease protonix to 89m q day.       Relevant Medications   pantoprazole (PROTONIX) 20 MG tablet   Hypercholesterolemia    The 10-year ASCVD risk score (Arnett DK, et al., 2019) is: 5.4%   Values used to calculate the score:     Age: [age]years     Sex: Female     Is Non-Hispanic African American: No     Diabetic: No     Tobacco smoker: No     Systolic Blood Pressure: 1161mmHg     Is BP treated: Yes     HDL Cholesterol: 48.6 mg/dL     Total Cholesterol: 217 mg/dL  Low cholesterol diet and exercise.  Follow lipid panel.       Hyperglycemia    Low carb diet and exercise.  Follow met b and a1c.  Lab Results  Component Value Date   HGBA1C 6.2 01/13/2022       Hypothyroid    On thyroid replacement.  Follow tsh.       Lumbosacral radiculopathy at L5    Has seen [facility].  Two rounds of prednisone.  Started neurontin.  Has f/u 01/2022.       Relevant Medications   gabapentin (NEURONTIN) 300 MG capsule    Pseudoangiomatous stromal hyperplasia of breast    Previously saw Dr BBary Castilla  Mammogram 09/14/21 [facility].       Stress    Continue zoloft.  Appears to be handling things well.  Follow.       Swelling of right lower extremity    Swelling as outlined.  Check lower extremity ultrasound to confirm no clot.  Follow.  If negative, compression hose and leg elevation.         CEinar Pheasant MD

## 2022-01-18 NOTE — Telephone Encounter (Signed)
-----   Message from Dale Gapland, MD sent at 01/18/2022  4:55 PM EDT ----- Please call and notify - lower extremity ultrasound is negative for DVT.

## 2022-01-18 NOTE — Patient Instructions (Signed)
Decrease protonix to 20mg  per day.

## 2022-01-24 NOTE — Assessment & Plan Note (Signed)
Has seen [facility].  Two rounds of prednisone.  Started neurontin.  Has f/u 01/2022.

## 2022-01-24 NOTE — Assessment & Plan Note (Signed)
Continue zoloft.  Appears to be handling things well.  Follow.  

## 2022-01-24 NOTE — Assessment & Plan Note (Signed)
01/13/22 - liver panel wnl.

## 2022-01-24 NOTE — Assessment & Plan Note (Signed)
Previously saw Dr Lemar Livings.  Mammogram 09/14/21 [facility].

## 2022-01-24 NOTE — Assessment & Plan Note (Signed)
Low carb diet and exercise.  Follow met b and a1c.  Lab Results  Component Value Date   HGBA1C 6.2 01/13/2022

## 2022-01-24 NOTE — Assessment & Plan Note (Signed)
Swelling as outlined.  Check lower extremity ultrasound to confirm no clot.  Follow.  If negative, compression hose and leg elevation.

## 2022-01-24 NOTE — Assessment & Plan Note (Addendum)
Protonix.  No increased symptoms reported.  Controlled.  Will decrease protonix to 20mg  q day.

## 2022-01-24 NOTE — Assessment & Plan Note (Signed)
On thyroid replacement.  Follow tsh.  

## 2022-02-10 ENCOUNTER — Other Ambulatory Visit: Payer: Self-pay | Admitting: Internal Medicine

## 2022-03-03 ENCOUNTER — Other Ambulatory Visit: Payer: Self-pay | Admitting: Internal Medicine

## 2022-03-05 ENCOUNTER — Other Ambulatory Visit: Payer: Self-pay | Admitting: Internal Medicine

## 2022-03-24 ENCOUNTER — Encounter: Payer: Self-pay | Admitting: Internal Medicine

## 2022-03-24 ENCOUNTER — Ambulatory Visit (INDEPENDENT_AMBULATORY_CARE_PROVIDER_SITE_OTHER): Payer: BC Managed Care – PPO | Admitting: Internal Medicine

## 2022-03-24 VITALS — BP 120/70 | HR 77 | Temp 98.9°F | Ht 66.0 in | Wt 205.2 lb

## 2022-03-24 DIAGNOSIS — R739 Hyperglycemia, unspecified: Secondary | ICD-10-CM | POA: Diagnosis not present

## 2022-03-24 DIAGNOSIS — E039 Hypothyroidism, unspecified: Secondary | ICD-10-CM

## 2022-03-24 DIAGNOSIS — M5417 Radiculopathy, lumbosacral region: Secondary | ICD-10-CM

## 2022-03-24 DIAGNOSIS — F439 Reaction to severe stress, unspecified: Secondary | ICD-10-CM

## 2022-03-24 DIAGNOSIS — E7800 Pure hypercholesterolemia, unspecified: Secondary | ICD-10-CM

## 2022-03-24 DIAGNOSIS — Z114 Encounter for screening for human immunodeficiency virus [HIV]: Secondary | ICD-10-CM

## 2022-03-24 DIAGNOSIS — Z1159 Encounter for screening for other viral diseases: Secondary | ICD-10-CM

## 2022-03-24 DIAGNOSIS — K219 Gastro-esophageal reflux disease without esophagitis: Secondary | ICD-10-CM | POA: Diagnosis not present

## 2022-03-24 MED ORDER — GABAPENTIN 100 MG PO CAPS: 100.0000 mg | ORAL_CAPSULE | Freq: Two times a day (BID) | ORAL | 2 refills | Status: DC

## 2022-03-24 NOTE — Progress Notes (Signed)
Patient ID: Carol Jimenez, female   DOB: 08-04-1960, 61 y.o.   MRN: [id number]   Subjective:    Patient ID: Carol Jimenez, female    DOB: 06-Jun-1961, 61 y.o.   MRN: [id number]   Patient here for  Chief Complaint  Patient presents with   Follow-up    2 month Blood Pressure follow up   .   HPI Here to follow up regarding her blood pressure and cholesterol.  Previous - left sciatic pain.  Started on gabapentin.  Seeing ortho.  She is doing better.  Avoid picking up heavy objects.  Protonix decreased to 57m.  No chest pain reported.  No abdominal pain or bowel change reported.  Blood pressures <120/70-80.    Past Medical History:  Diagnosis Date   Allergy    Hx: UTI (urinary tract infection)    Hypothyroidism    Past Surgical History:  Procedure Laterality Date   BREAST BIOPSY Left 2015   negative, stereotactic biopsy   CESAREAN SECTION     ENDOMETRIAL ABLATION  2000   KNEE SURGERY Left    Family History  Problem Relation Age of Onset   Congestive Heart Failure Mother    Diabetes Mother    COPD Mother    Hypertension Father    Esophageal cancer Father [id number]      treated at [facility]30  Colon polyps Other    Hypertension Sister    Hypertension Sister    Diabetes Sister    Breast cancer Neg Hx    Social History   Socioeconomic History   Marital status: Married    Spouse name: Not on file   Number of children: Not on file   Years of education: Not on file   Highest education level: Not on file  Occupational History   Not on file  Tobacco Use   Smoking status: Never   Smokeless tobacco: Never  Vaping Use   Vaping Use: Never used  Substance and Sexual Activity   Alcohol use: Yes    Alcohol/week: 0.0 standard drinks of alcohol    Comment: rare   Drug use: No   Sexual activity: Not on file  Other Topics Concern   Not on file  Social History Narrative   Not on file   Social Determinants of Health   Financial Resource Strain: Not on file  Food Insecurity: Not  on file  Transportation Needs: Not on file  Physical Activity: Not on file  Stress: Not on file  Social Connections: Not on file     Review of Systems  Constitutional:  Negative for appetite change and unexpected weight change.  HENT:  Negative for congestion and sinus pressure.   Respiratory:  Negative for cough, chest tightness and shortness of breath.   Cardiovascular:  Negative for chest pain, palpitations and leg swelling.  Gastrointestinal:  Negative for abdominal pain, diarrhea, nausea and vomiting.  Genitourinary:  Negative for difficulty urinating and dysuria.  Musculoskeletal:  Negative for joint swelling and myalgias.  Skin:  Negative for color change and rash.  Neurological:  Negative for dizziness, light-headedness and headaches.  Psychiatric/Behavioral:  Negative for agitation and dysphoric mood.        Objective:     BP 120/70 (BP Location: Left Arm, Patient Position: Sitting, Cuff Size: Normal)   Pulse 77   Temp 98.9 F (37.2 C) (Oral)   Ht _0  (1.676 m)   Wt 205 lb 3.2 oz (93.1 kg)   LMP 12/26/2012  SpO2 96%   BMI 33.12 kg/m  Wt Readings from Last 3 Encounters:  03/24/22 205 lb 3.2 oz (93.1 kg)  01/18/22 206 lb 0.3 oz (93.5 kg)  09/16/21 204 lb 9.6 oz (92.8 kg)    Physical Exam Vitals reviewed.  Constitutional:      General: She is not in acute distress.    Appearance: Normal appearance.  HENT:     Head: Normocephalic and atraumatic.     Right Ear: External ear normal.     Left Ear: External ear normal.  Eyes:     General: No scleral icterus.       Right eye: No discharge.        Left eye: No discharge.     Conjunctiva/sclera: Conjunctivae normal.  Neck:     Thyroid: No thyromegaly.  Cardiovascular:     Rate and Rhythm: Normal rate and regular rhythm.  Pulmonary:     Effort: No respiratory distress.     Breath sounds: Normal breath sounds. No wheezing.  Abdominal:     General: Bowel sounds are normal.     Palpations: Abdomen is soft.      Tenderness: There is no abdominal tenderness.  Musculoskeletal:        General: No swelling or tenderness.     Cervical back: Neck supple. No tenderness.  Lymphadenopathy:     Cervical: No cervical adenopathy.  Skin:    Findings: No erythema or rash.  Neurological:     Mental Status: She is alert.  Psychiatric:        Mood and Affect: Mood normal.        Behavior: Behavior normal.      Outpatient Encounter Medications as of 03/24/2022  Medication Sig   ARMOUR THYROID 90 MG tablet TAKE 1 TABLET(90 MG) BY MOUTH DAILY   estradiol (ESTRACE) 0.1 MG/GM vaginal cream INSERT 1 APPLICATORFUL VAGINALLY 2 TIMES A WEEK   estradiol (ESTRACE) 0.5 MG tablet TAKE 1 TO 2 TABLETS BY MOUTH EVERY DAY   fluticasone (FLONASE) 50 MCG/ACT nasal spray Place 2 sprays into both nostrils daily.   gabapentin (NEURONTIN) 100 MG capsule Take 1 capsule (100 mg total) by mouth 2 (two) times daily.   hydrochlorothiazide (HYDRODIURIL) 25 MG tablet TAKE 1 TABLET(25 MG) BY MOUTH DAILY   meloxicam (MOBIC) 7.5 MG tablet Take 15 mg by mouth daily as needed for pain.   pantoprazole (PROTONIX) 20 MG tablet Take 1 tablet (20 mg total) by mouth daily.   progesterone (PROMETRIUM) 100 MG capsule TAKE 1 CAPSULE(100 MG) BY MOUTH DAILY   scopolamine (TRANSDERM-SCOP) 1 MG/3DAYS Place 1 patch (1.5 mg total) onto the skin every 3 (three) days.   sertraline (ZOLOFT) 50 MG tablet TAKE 1 TABLET(50 MG) BY MOUTH DAILY   tretinoin (RETIN-A) 0.025 % cream Apply 1 application topically as needed.   [DISCONTINUED] gabapentin (NEURONTIN) 300 MG capsule Take 300 mg by mouth 2 (two) times daily.   No facility-administered encounter medications on file as of 03/24/2022.     Lab Results  Component Value Date   WBC 6.3 09/14/2021   HGB 15.2 (H) 09/14/2021   HCT 44.6 09/14/2021   PLT 201.0 09/14/2021   GLUCOSE 110 (H) 01/13/2022   CHOL 217 (H) 01/13/2022   TRIG 228.0 (H) 01/13/2022   HDL 48.60 01/13/2022   LDLDIRECT 144.0 01/13/2022    LDLCALC 121 (H) 05/01/2021   ALT 29 01/13/2022   AST 34 01/13/2022   NA 138 01/13/2022   K 3.5 01/13/2022  CL 100 01/13/2022   CREATININE 0.63 01/13/2022   BUN 15 01/13/2022   CO2 28 01/13/2022   TSH 2.52 01/13/2022   HGBA1C 6.2 01/13/2022    US Venous Img Lower Unilateral Right (DVT)  Result Date: 01/18/2022 CLINICAL DATA:  Increased swelling to the right lower extremity. Surgery 1 year ago. EXAM: Right LOWER EXTREMITY VENOUS DOPPLER ULTRASOUND TECHNIQUE: Gray-scale sonography with compression, as well as color and duplex ultrasound, were performed to evaluate the deep venous system(s) from the level of the common femoral vein through the popliteal and proximal calf veins. COMPARISON:  None Available. FINDINGS: VENOUS Normal compressibility of the common femoral, superficial femoral, and popliteal veins, as well as the visualized calf veins. Visualized portions of profunda femoral vein and great saphenous vein unremarkable. No filling defects to suggest DVT on grayscale or color Doppler imaging. Doppler waveforms show normal direction of venous flow, normal respiratory plasticity and response to augmentation. Limited views of the contralateral common femoral vein are unremarkable. OTHER None. Limitations: none IMPRESSION: Negative. Electronically Signed   By: San Morelle M.D.   On: 01/18/2022 16:42       Assessment & Plan:   Problem List Items Addressed This Visit     Acid reflux    Appears to be doing ok on protonix 91m.  Follow.       Hypercholesterolemia    The 10-year ASCVD risk score (Arnett DK, et al., 2019) is: 5.4%   Values used to calculate the score:     Age: [age]years     Sex: Female     Is Non-Hispanic African American: No     Diabetic: No     Tobacco smoker: No     Systolic Blood Pressure: 1672mmHg     Is BP treated: Yes     HDL Cholesterol: 48.6 mg/dL     Total Cholesterol: 217 mg/dL  Low cholesterol diet and exercise.  Follow lipid panel.        Relevant Orders   CBC with Differential/Platelet   Hepatic function panel   Basic metabolic panel   Lipid panel   Hyperglycemia    Low carb diet and exercise.  Follow met b and a1c.  Lab Results  Component Value Date   HGBA1C 6.2 01/13/2022       Relevant Orders   Hemoglobin A1c   Hypothyroid    On thyroid replacement.  Follow tsh.       Relevant Orders   TSH   Lumbosacral radiculopathy at L5    Has seen [facility]. Previous prednisone taper. On gabapentin. Discussed bid dosing.  Appears to be doing better.  Follow       Relevant Medications   gabapentin (NEURONTIN) 100 MG capsule   Stress    Continue zoloft.  Appears to be handling things well.  Follow.       Other Visit Diagnoses     Need for hepatitis C screening test    -  Primary   Relevant Orders   Hepatitis C Antibody   Encounter for screening for HIV       Relevant Orders   HIV antibody (with reflex)        CEinar Pheasant MD

## 2022-03-24 NOTE — Progress Notes (Signed)
Patient ID: Carol Jimenez, female   DOB: 04/06/1961, 61 y.o.   MRN: [id number]

## 2022-03-28 NOTE — Assessment & Plan Note (Signed)
Continue zoloft.  Appears to be handling things well.  Follow.  

## 2022-03-28 NOTE — Assessment & Plan Note (Signed)
Appears to be doing ok on protonix 20mg .  Follow.

## 2022-03-28 NOTE — Assessment & Plan Note (Signed)
The 10-year ASCVD risk score (Arnett DK, et al., 2019) is: 5.4%   Values used to calculate the score:     Age: 61 years     Sex: Female     Is Non-Hispanic African American: No     Diabetic: No     Tobacco smoker: No     Systolic Blood Pressure: 675 mmHg     Is BP treated: Yes     HDL Cholesterol: 48.6 mg/dL     Total Cholesterol: 217 mg/dL  Low cholesterol diet and exercise.  Follow lipid panel.

## 2022-03-28 NOTE — Assessment & Plan Note (Addendum)
Has seen [facility]. Previous prednisone taper. On gabapentin. Discussed bid dosing.  Appears to be doing better.  Follow

## 2022-03-28 NOTE — Assessment & Plan Note (Signed)
Low carb diet and exercise.  Follow met b and a1c.  Lab Results  Component Value Date   HGBA1C 6.2 01/13/2022

## 2022-03-28 NOTE — Assessment & Plan Note (Signed)
On thyroid replacement.  Follow tsh.  

## 2022-04-12 ENCOUNTER — Telehealth: Payer: BC Managed Care – PPO | Admitting: Family Medicine

## 2022-04-12 ENCOUNTER — Telehealth: Payer: BC Managed Care – PPO | Admitting: Emergency Medicine

## 2022-04-12 DIAGNOSIS — U071 COVID-19: Secondary | ICD-10-CM

## 2022-04-12 MED ORDER — NIRMATRELVIR/RITONAVIR (PAXLOVID)TABLET: 3.0000 | ORAL_TABLET | Freq: Two times a day (BID) | ORAL | 0 refills | Status: AC

## 2022-04-12 MED ORDER — BENZONATATE 100 MG PO CAPS: 100.0000 mg | ORAL_CAPSULE | Freq: Two times a day (BID) | ORAL | 0 refills | Status: DC | PRN

## 2022-04-12 NOTE — Patient Instructions (Signed)
   You have tested positive for COVID-19, meaning that you were infected with the novel coronavirus and could give the virus to others.  It is vitally important that you stay home so you do not spread it to others.      Please continue isolation at home, for at least 10 days since the start of your symptoms and until you have had 24 hours with no fever (without taking a fever reducer) and with improving of symptoms.  If you have no symptoms but tested positive (or all symptoms resolve after 5 days and you have no fever) you can leave your house but continue to wear a mask around others for an additional 5 days. If you have a fever,continue to stay home until you have had 24 hours of no fever. Most cases improve 5-10 days from onset but we have seen a small number of patients who have gotten worse after the 10 days.  Please be sure to watch for worsening symptoms and remain taking the proper precautions.   Go to the nearest hospital ED for assessment if fever/cough/breathlessness are severe or illness seems like a threat to life.    The following symptoms may appear 2-14 days after exposure: Fever Cough Shortness of breath or difficulty breathing Chills Repeated shaking with chills Muscle pain Headache Sore throat New loss of taste or smell Fatigue Congestion or runny nose Nausea or vomiting Diarrhea  You can use medication such as prescription cough medication called Tessalon Perles 100 mg. You may take 1-2 capsules every 8 hours as needed for cough  I have prescribed Paxlovid for treatment of COVID.   HOME CARE: Only take medications as instructed by your medical team. Drink plenty of fluids and get plenty of rest. A steam or ultrasonic humidifier can help if you have congestion.   GET HELP RIGHT AWAY IF YOU HAVE EMERGENCY WARNING SIGNS.  Call 911 or proceed to your closest emergency facility if: You develop worsening high fever. Trouble breathing Bluish lips or face Persistent  pain or pressure in the chest New confusion Inability to wake or stay awake You cough up blood. Your symptoms become more severe Inability to hold down food or fluids  This list is not all possible symptoms. Contact your medical provider for any symptoms that are severe or concerning to you.

## 2022-04-12 NOTE — Progress Notes (Signed)
Virtual Visit Consent   KAYE LUOMA, you are scheduled for a virtual visit with a [facility] provider today. Just as with appointments in the office, your consent must be obtained to participate. Your consent will be active for this visit and any virtual visit you may have with one of our providers in the next 365 days. If you have a MyChart account, a copy of this consent can be sent to you electronically.  As this is a virtual visit, video technology does not allow for your provider to perform a traditional examination. This may limit your provider's ability to fully assess your condition. If your provider identifies any concerns that need to be evaluated in person or the need to arrange testing (such as labs, EKG, etc.), we will make arrangements to do so. Although advances in technology are sophisticated, we cannot ensure that it will always work on either your end or our end. If the connection with a video visit is poor, the visit may have to be switched to a telephone visit. With either a video or telephone visit, we are not always able to ensure that we have a secure connection.  By engaging in this virtual visit, you consent to the provision of healthcare and authorize for your insurance to be billed (if applicable) for the services provided during this visit. Depending on your insurance coverage, you may receive a charge related to this service.  I need to obtain your verbal consent now. Are you willing to proceed with your visit today? Carol Jimenez has provided verbal consent on 04/12/2022 for a virtual visit (video or telephone). Roxy Horseman, PA-C  Date: 04/12/2022 9:30 AM  Virtual Visit via Video Note   I, Roxy Horseman, connected with  Carol Jimenez  ([id number], 1960/08/06) on 04/12/22 at  9:30 AM EDT by a video-enabled telemedicine application and verified that I am speaking with the correct person using two identifiers.  Location: Patient: Virtual Visit Location  Patient: Home Provider: Virtual Visit Location Provider: Home   I discussed the limitations of evaluation and management by telemedicine and the availability of in person appointments. The patient expressed understanding and agreed to proceed.    History of Present Illness: Carol Jimenez is a 61 y.o. who identifies as a female who was assigned female at birth, and is being seen today for COVID.  States that she began with cold symptoms 3 days ago.  Took home COVID test this morning. Reports headache, congestion, and cough.  Has tried taking Claritin and Sudafed and Tylenol.  No hx of COPD or asthma.  HPI: HPI  Problems:  Patient Active Problem List   Diagnosis Date Noted   Swelling of right lower extremity 01/18/2022   Lumbosacral radiculopathy at L5 12/19/2021   Pre-op evaluation 09/17/2021   Vaginal dryness 05/05/2020   Left ankle pain 02/04/2020   Acid reflux 11/19/2019   Change in hearing 11/19/2019   Chest pain 11/08/2019   Knee pain, bilateral 07/03/2019   Sinusitis 04/28/2019   Congestion of nasal sinus 04/22/2019   Toe pain, left 01/22/2018   Hyperglycemia 06/13/2016   Right knee pain 08/17/2015   Stress 03/23/2015   Health care maintenance 12/24/2014   History of non anemic vitamin B12 deficiency 12/23/2014   Vitamin D deficiency 12/23/2014   Rib pain on right side 12/23/2014   Pseudoangiomatous stromal hyperplasia of breast 08/12/2014   BMI 33.0-33.9,adult 04/13/2014   Abnormal liver function tests 04/13/2014   Hypothyroid 11/12/2013  Menopausal symptoms 11/12/2013   Side pain 11/12/2013   Fatigue 11/12/2013   Daytime somnolence 11/12/2013   Environmental allergies 11/12/2013   Hypercholesterolemia 11/12/2013    Allergies:  Allergies  Allergen Reactions   Sulfa Antibiotics Rash   Medications:  Current Outpatient Medications:    nirmatrelvir/ritonavir EUA (PAXLOVID) 20 x 150 MG & 10 x 100MG  TABS, Take 3 tablets by mouth 2 (two) times daily for 5 days.  (Take nirmatrelvir 150 mg two tablets twice daily for 5 days and ritonavir 100 mg one tablet twice daily for 5 days) Patient GFR is >60, Disp: 30 tablet, Rfl: 0   ARMOUR THYROID 90 MG tablet, TAKE 1 TABLET(90 MG) BY MOUTH DAILY, Disp: 90 tablet, Rfl: 1   estradiol (ESTRACE) 0.1 MG/GM vaginal cream, INSERT 1 APPLICATORFUL VAGINALLY 2 TIMES A WEEK, Disp: 42.5 g, Rfl: 1   estradiol (ESTRACE) 0.5 MG tablet, TAKE 1 TO 2 TABLETS BY MOUTH EVERY DAY, Disp: 180 tablet, Rfl: 2   fluticasone (FLONASE) 50 MCG/ACT nasal spray, Place 2 sprays into both nostrils daily., Disp: 9.9 g, Rfl: 0   gabapentin (NEURONTIN) 100 MG capsule, Take 1 capsule (100 mg total) by mouth 2 (two) times daily., Disp: 60 capsule, Rfl: 2   hydrochlorothiazide (HYDRODIURIL) 25 MG tablet, TAKE 1 TABLET(25 MG) BY MOUTH DAILY, Disp: 90 tablet, Rfl: 1   meloxicam (MOBIC) 7.5 MG tablet, Take 15 mg by mouth daily as needed for pain., Disp: , Rfl:    pantoprazole (PROTONIX) 20 MG tablet, Take 1 tablet (20 mg total) by mouth daily., Disp: 30 tablet, Rfl: 3   progesterone (PROMETRIUM) 100 MG capsule, TAKE 1 CAPSULE(100 MG) BY MOUTH DAILY, Disp: 90 capsule, Rfl: 0   scopolamine (TRANSDERM-SCOP) 1 MG/3DAYS, Place 1 patch (1.5 mg total) onto the skin every 3 (three) days., Disp: 10 patch, Rfl: 0   sertraline (ZOLOFT) 50 MG tablet, TAKE 1 TABLET(50 MG) BY MOUTH DAILY, Disp: 90 tablet, Rfl: 1   tretinoin (RETIN-A) 0.025 % cream, Apply 1 application topically as needed., Disp: , Rfl:   Observations/Objective: Patient is well-developed, well-nourished in no acute distress.  Resting comfortably  at home.  Head is normocephalic, atraumatic.  No labored breathing.  Speech is clear and coherent with logical content.  Patient is alert and oriented at baseline.    Assessment and Plan: 1. COVID-19  - Continue supportive care - Paxlovid per request.  GFR 3 months ago was >60  Meds ordered this encounter  Medications   nirmatrelvir/ritonavir EUA  (PAXLOVID) 20 x 150 MG & 10 x 100MG  TABS    Sig: Take 3 tablets by mouth 2 (two) times daily for 5 days. (Take nirmatrelvir 150 mg two tablets twice daily for 5 days and ritonavir 100 mg one tablet twice daily for 5 days) Patient GFR is >60    Dispense:  30 tablet    Refill:  0    Order Specific Question:   Supervising Provider    Answer:   Chase Picket [[id number]]   benzonatate (TESSALON) 100 MG capsule    Sig: Take 1 capsule (100 mg total) by mouth 2 (two) times daily as needed for cough.    Dispense:  20 capsule    Refill:  0    Order Specific Question:   Supervising Provider    Answer:   Chase Picket [id number]       Follow Up Instructions: I discussed the assessment and treatment plan with the patient. The patient was provided an opportunity to  ask questions and all were answered. The patient agreed with the plan and demonstrated an understanding of the instructions.  A copy of instructions were sent to the patient via MyChart unless otherwise noted below.     The patient was advised to call back or seek an in-person evaluation if the symptoms worsen or if the condition fails to improve as anticipated.  Time:  I spent 12 minutes with the patient via telehealth technology discussing the above problems/concerns.    Roxy Horseman, PA-C

## 2022-04-12 NOTE — Progress Notes (Signed)
[place]   Covid +, desires antiviral treatment measures  Message sent to convert to VV

## 2022-05-19 ENCOUNTER — Other Ambulatory Visit: Payer: Self-pay | Admitting: Internal Medicine

## 2022-06-03 DIAGNOSIS — M545 Low back pain, unspecified: Secondary | ICD-10-CM | POA: Diagnosis not present

## 2022-06-03 DIAGNOSIS — M5416 Radiculopathy, lumbar region: Secondary | ICD-10-CM | POA: Diagnosis not present

## 2022-06-23 DIAGNOSIS — H903 Sensorineural hearing loss, bilateral: Secondary | ICD-10-CM | POA: Diagnosis not present

## 2022-06-25 ENCOUNTER — Other Ambulatory Visit: Payer: Self-pay | Admitting: Internal Medicine

## 2022-07-01 ENCOUNTER — Other Ambulatory Visit (INDEPENDENT_AMBULATORY_CARE_PROVIDER_SITE_OTHER): Payer: BC Managed Care – PPO

## 2022-07-01 DIAGNOSIS — Z1159 Encounter for screening for other viral diseases: Secondary | ICD-10-CM

## 2022-07-01 DIAGNOSIS — E7800 Pure hypercholesterolemia, unspecified: Secondary | ICD-10-CM | POA: Diagnosis not present

## 2022-07-01 DIAGNOSIS — E039 Hypothyroidism, unspecified: Secondary | ICD-10-CM

## 2022-07-01 DIAGNOSIS — R739 Hyperglycemia, unspecified: Secondary | ICD-10-CM

## 2022-07-01 DIAGNOSIS — Z114 Encounter for screening for human immunodeficiency virus [HIV]: Secondary | ICD-10-CM | POA: Diagnosis not present

## 2022-07-01 LAB — BASIC METABOLIC PANEL
BUN: 15 mg/dL (ref 6–23)
CO2: 30 mEq/L (ref 19–32)
Calcium: 9.5 mg/dL (ref 8.4–10.5)
Chloride: 101 mEq/L (ref 96–112)
Creatinine, Ser: 0.67 mg/dL (ref 0.40–1.20)
GFR: 93.74 mL/min (ref >60.00)
Glucose, Bld: 107 mg/dL — ABNORMAL HIGH (ref 70–99)
Potassium: 4.1 mEq/L (ref 3.5–5.1)
Sodium: 139 mEq/L (ref 135–145)

## 2022-07-01 LAB — CBC WITH DIFFERENTIAL/PLATELET
Basophils Absolute: 0 10*3/uL (ref 0.0–0.1)
Basophils Relative: 0.6 % (ref 0.0–3.0)
Eosinophils Absolute: 0 10*3/uL (ref 0.0–0.7)
Eosinophils Relative: 0.8 % (ref 0.0–5.0)
HCT: 46 % (ref 36.0–46.0)
Hemoglobin: 15.4 g/dL — ABNORMAL HIGH (ref 12.0–15.0)
Lymphocytes Relative: 19.5 % (ref 12.0–46.0)
Lymphs Abs: 1.2 10*3/uL (ref 0.7–4.0)
MCHC: 33.5 g/dL (ref 30.0–36.0)
MCV: 93.9 fl (ref 78.0–100.0)
Monocytes Absolute: 0.5 10*3/uL (ref 0.1–1.0)
Monocytes Relative: 7.7 % (ref 3.0–12.0)
Neutro Abs: 4.2 10*3/uL (ref 1.4–7.7)
Neutrophils Relative %: 71.4 % (ref 43.0–77.0)
Platelets: 214 10*3/uL (ref 150.0–400.0)
RBC: 4.9 Mil/uL (ref 3.87–5.11)
RDW: 14 % (ref 11.5–15.5)
WBC: 5.9 10*3/uL (ref 4.0–10.5)

## 2022-07-01 LAB — HEPATIC FUNCTION PANEL
ALT: 28 U/L (ref 0–35)
AST: 21 U/L (ref 0–37)
Albumin: 4.2 g/dL (ref 3.5–5.2)
Alkaline Phosphatase: 60 U/L (ref 39–117)
Bilirubin, Direct: 0.1 mg/dL (ref 0.0–0.3)
Total Bilirubin: 0.6 mg/dL (ref 0.2–1.2)
Total Protein: 6.8 g/dL (ref 6.0–8.3)

## 2022-07-01 LAB — LIPID PANEL
Cholesterol: 242 mg/dL — ABNORMAL HIGH (ref 0–200)
HDL: 55.2 mg/dL (ref >39.00)
LDL Cholesterol: 147 mg/dL — ABNORMAL HIGH (ref 0–99)
NonHDL: 187.02
Total CHOL/HDL Ratio: 4
Triglycerides: 200 mg/dL — ABNORMAL HIGH (ref 0.0–149.0)
VLDL: 40 mg/dL (ref 0.0–40.0)

## 2022-07-01 LAB — TSH: TSH: 1.3 u[IU]/mL (ref 0.35–5.50)

## 2022-07-01 LAB — HEMOGLOBIN A1C: Hgb A1c MFr Bld: 6 % (ref 4.6–6.5)

## 2022-07-02 LAB — HIV ANTIBODY (ROUTINE TESTING W REFLEX): HIV 1&2 Ab, 4th Generation: NONREACTIVE

## 2022-07-02 LAB — HEPATITIS C ANTIBODY: Hepatitis C Ab: NONREACTIVE

## 2022-07-06 ENCOUNTER — Encounter: Payer: Self-pay | Admitting: Internal Medicine

## 2022-07-06 ENCOUNTER — Ambulatory Visit: Payer: BC Managed Care – PPO | Admitting: Internal Medicine

## 2022-07-06 VITALS — BP 120/76 | HR 67 | Temp 98.3°F | Resp 15 | Ht 66.0 in | Wt 198.2 lb

## 2022-07-06 DIAGNOSIS — M25561 Pain in right knee: Secondary | ICD-10-CM

## 2022-07-06 DIAGNOSIS — E039 Hypothyroidism, unspecified: Secondary | ICD-10-CM | POA: Diagnosis not present

## 2022-07-06 DIAGNOSIS — E7800 Pure hypercholesterolemia, unspecified: Secondary | ICD-10-CM | POA: Diagnosis not present

## 2022-07-06 DIAGNOSIS — F439 Reaction to severe stress, unspecified: Secondary | ICD-10-CM | POA: Diagnosis not present

## 2022-07-06 DIAGNOSIS — M25562 Pain in left knee: Secondary | ICD-10-CM

## 2022-07-06 DIAGNOSIS — R7989 Other specified abnormal findings of blood chemistry: Secondary | ICD-10-CM

## 2022-07-06 DIAGNOSIS — Z1231 Encounter for screening mammogram for malignant neoplasm of breast: Secondary | ICD-10-CM | POA: Diagnosis not present

## 2022-07-06 DIAGNOSIS — E559 Vitamin D deficiency, unspecified: Secondary | ICD-10-CM

## 2022-07-06 DIAGNOSIS — R739 Hyperglycemia, unspecified: Secondary | ICD-10-CM

## 2022-07-06 MED ORDER — HYDROCHLOROTHIAZIDE 25 MG PO TABS: ORAL_TABLET | ORAL | 1 refills | Status: DC

## 2022-07-06 MED ORDER — ARMOUR THYROID 90 MG PO TABS: ORAL_TABLET | ORAL | 1 refills | Status: DC

## 2022-07-06 MED ORDER — SERTRALINE HCL 50 MG PO TABS: ORAL_TABLET | ORAL | 1 refills | Status: DC

## 2022-07-06 NOTE — Assessment & Plan Note (Signed)
On thyroid replacement.  Follow tsh.  

## 2022-07-06 NOTE — Patient Instructions (Signed)
YOUR MAMMOGRAM IS DUE, PLEASE CALL AND GET THIS SCHEDULED!  A mammogram has been ordered for you. Please call the below facility to schedule ::  [facility] - call [phone]    

## 2022-07-06 NOTE — Assessment & Plan Note (Signed)
Continue zoloft.  Appears to be handling things well.  Follow.  

## 2022-07-06 NOTE — Assessment & Plan Note (Signed)
Seeing ortho.  On meloxicam.  GFR 93.  Follow. Pain much improved.

## 2022-07-06 NOTE — Assessment & Plan Note (Signed)
Low carb diet and exercise.  Follow met b and a1c.  Lab Results  Component Value Date   HGBA1C 6.0 07/01/2022   

## 2022-07-06 NOTE — Assessment & Plan Note (Signed)
Recheck vitamin D level with next labs.  

## 2022-07-06 NOTE — Progress Notes (Signed)
Patient ID: Carol Jimenez, female   DOB: [birth year], 62 y.o.   MRN: [id number]   Subjective:    Patient ID: Carol Jimenez, female    DOB: 18-Jan-1961, 62 y.o.   MRN: [id number]   Patient here for  Chief Complaint  Patient presents with   Medical Management of Chronic Issues   Hyperlipidemia   .   HPI Here to follow up regarding her blood pressure and cholesterol.  Previous - left sciatic pain.  Seeing ortho.  S/p injection.  Doing better now.  Sciatica not an issue nowShe is doing better.  No chest pain or sob reported.  No abdominal pain or bowel change reported.  Does take meloxicam.  Increased stress -work.  Feels she is handling things relatively well.  On zoloft.     Past Medical History:  Diagnosis Date   Allergy    Hx: UTI (urinary tract infection)    Hypothyroidism    Past Surgical History:  Procedure Laterality Date   BREAST BIOPSY Left 2015   negative, stereotactic biopsy   CESAREAN SECTION     ENDOMETRIAL ABLATION  2000   KNEE SURGERY Left    Family History  Problem Relation Age of Onset   Congestive Heart Failure Mother    Diabetes Mother    COPD Mother    Hypertension Father    Esophageal cancer Father 6       treated at [facility]   Colon polyps Other    Hypertension Sister    Hypertension Sister    Diabetes Sister    Breast cancer Neg Hx    Social History   Socioeconomic History   Marital status: Married    Spouse name: Not on file   Number of children: Not on file   Years of education: Not on file   Highest education level: Not on file  Occupational History   Not on file  Tobacco Use   Smoking status: Never   Smokeless tobacco: Never  Vaping Use   Vaping Use: Never used  Substance and Sexual Activity   Alcohol use: Yes    Alcohol/week: 0.0 standard drinks of alcohol    Comment: rare   Drug use: No   Sexual activity: Not on file  Other Topics Concern   Not on file  Social History Narrative   Not on file   Social Determinants of  Health   Financial Resource Strain: Not on file  Food Insecurity: Not on file  Transportation Needs: Not on file  Physical Activity: Not on file  Stress: Not on file  Social Connections: Not on file     Review of Systems  Constitutional:  Negative for appetite change and unexpected weight change.  HENT:  Negative for congestion and sinus pressure.   Respiratory:  Negative for cough, chest tightness and shortness of breath.   Cardiovascular:  Negative for chest pain, palpitations and leg swelling.  Gastrointestinal:  Negative for abdominal pain, diarrhea, nausea and vomiting.  Genitourinary:  Negative for difficulty urinating and dysuria.  Musculoskeletal:  Negative for joint swelling and myalgias.  Skin:  Negative for color change and rash.  Neurological:  Negative for dizziness, light-headedness and headaches.  Psychiatric/Behavioral:  Negative for agitation and dysphoric mood.        Objective:     BP 120/76 (BP Location: Left Arm, Patient Position: Sitting, Cuff Size: Large)   Pulse 67   Temp 98.3 F (36.8 C) (Temporal)   Resp 15   Ht 5'  6" (1.676 m)   Wt 198 lb 3.2 oz (89.9 kg)   LMP 12/26/2012   SpO2 97%   BMI 31.99 kg/m  Wt Readings from Last 3 Encounters:  07/06/22 198 lb 3.2 oz (89.9 kg)  03/24/22 205 lb 3.2 oz (93.1 kg)  01/18/22 206 lb 0.3 oz (93.5 kg)    Physical Exam Vitals reviewed.  Constitutional:      General: She is not in acute distress.    Appearance: Normal appearance.  HENT:     Head: Normocephalic and atraumatic.     Right Ear: External ear normal.     Left Ear: External ear normal.  Eyes:     General: No scleral icterus.       Right eye: No discharge.        Left eye: No discharge.     Conjunctiva/sclera: Conjunctivae normal.  Neck:     Thyroid: No thyromegaly.  Cardiovascular:     Rate and Rhythm: Normal rate and regular rhythm.  Pulmonary:     Effort: No respiratory distress.     Breath sounds: Normal breath sounds. No  wheezing.  Abdominal:     General: Bowel sounds are normal.     Palpations: Abdomen is soft.     Tenderness: There is no abdominal tenderness.  Musculoskeletal:        General: No swelling or tenderness.     Cervical back: Neck supple. No tenderness.  Lymphadenopathy:     Cervical: No cervical adenopathy.  Skin:    Findings: No erythema or rash.  Neurological:     Mental Status: She is alert.  Psychiatric:        Mood and Affect: Mood normal.        Behavior: Behavior normal.      Outpatient Encounter Medications as of 07/06/2022  Medication Sig   estradiol (ESTRACE) 0.1 MG/GM vaginal cream INSERT 1 APPLICATORFUL VAGINALLY 2 TIMES A WEEK   estradiol (ESTRACE) 0.5 MG tablet TAKE 1 TO 2 TABLETS BY MOUTH EVERY DAY   fluticasone (FLONASE) 50 MCG/ACT nasal spray Place 2 sprays into both nostrils daily.   meloxicam (MOBIC) 7.5 MG tablet Take 15 mg by mouth daily as needed for pain.   pantoprazole (PROTONIX) 20 MG tablet TAKE 1 TABLET(20 MG) BY MOUTH DAILY   progesterone (PROMETRIUM) 100 MG capsule TAKE 1 CAPSULE(100 MG) BY MOUTH DAILY   tretinoin (RETIN-A) 0.025 % cream Apply 1 application topically as needed.   [DISCONTINUED] ARMOUR THYROID 90 MG tablet TAKE 1 TABLET(90 MG) BY MOUTH DAILY   [DISCONTINUED] hydrochlorothiazide (HYDRODIURIL) 25 MG tablet TAKE 1 TABLET(25 MG) BY MOUTH DAILY   [DISCONTINUED] sertraline (ZOLOFT) 50 MG tablet TAKE 1 TABLET(50 MG) BY MOUTH DAILY   ARMOUR THYROID 90 MG tablet TAKE 1 TABLET(90 MG) BY MOUTH DAILY   hydrochlorothiazide (HYDRODIURIL) 25 MG tablet TAKE 1 TABLET(25 MG) BY MOUTH DAILY   sertraline (ZOLOFT) 50 MG tablet TAKE 1 TABLET(50 MG) BY MOUTH DAILY   [DISCONTINUED] benzonatate (TESSALON) 100 MG capsule Take 1 capsule (100 mg total) by mouth 2 (two) times daily as needed for cough.   [DISCONTINUED] gabapentin (NEURONTIN) 100 MG capsule Take 1 capsule (100 mg total) by mouth 2 (two) times daily. (Patient not taking: Reported on 07/06/2022)    [DISCONTINUED] scopolamine (TRANSDERM-SCOP) 1 MG/3DAYS Place 1 patch (1.5 mg total) onto the skin every 3 (three) days. (Patient not taking: Reported on 07/06/2022)   No facility-administered encounter medications on file as of 07/06/2022.  Lab Results  Component Value Date   WBC 5.9 07/01/2022   HGB 15.4 (H) 07/01/2022   HCT 46.0 07/01/2022   PLT 214.0 07/01/2022   GLUCOSE 107 (H) 07/01/2022   CHOL 242 (H) 07/01/2022   TRIG 200.0 (H) 07/01/2022   HDL 55.20 07/01/2022   LDLDIRECT 144.0 01/13/2022   LDLCALC 147 (H) 07/01/2022   ALT 28 07/01/2022   AST 21 07/01/2022   NA 139 07/01/2022   K 4.1 07/01/2022   CL 101 07/01/2022   CREATININE 0.67 07/01/2022   BUN 15 07/01/2022   CO2 30 07/01/2022   TSH 1.30 07/01/2022   HGBA1C 6.0 07/01/2022    US Venous Img Lower Unilateral Right (DVT)  Result Date: 01/18/2022 CLINICAL DATA:  Increased swelling to the right lower extremity. Surgery 1 year ago. EXAM: Right LOWER EXTREMITY VENOUS DOPPLER ULTRASOUND TECHNIQUE: Gray-scale sonography with compression, as well as color and duplex ultrasound, were performed to evaluate the deep venous system(s) from the level of the common femoral vein through the popliteal and proximal calf veins. COMPARISON:  None Available. FINDINGS: VENOUS Normal compressibility of the common femoral, superficial femoral, and popliteal veins, as well as the visualized calf veins. Visualized portions of profunda femoral vein and great saphenous vein unremarkable. No filling defects to suggest DVT on grayscale or color Doppler imaging. Doppler waveforms show normal direction of venous flow, normal respiratory plasticity and response to augmentation. Limited views of the contralateral common femoral vein are unremarkable. OTHER None. Limitations: none IMPRESSION: Negative. Electronically Signed   By: Marin Roberts M.D.   On: 01/18/2022 16:42       Assessment & Plan:   Problem List Items Addressed This Visit      Abnormal liver function tests    Recent liver check wnl.       Hypercholesterolemia - Primary    The 10-year ASCVD risk score (Arnett DK, et al., 2019) is: 6.6%   Values used to calculate the score:     Age: 50 years     Sex: Female     Is Non-Hispanic African American: No     Diabetic: No     Tobacco smoker: No     Systolic Blood Pressure: 132 mmHg     Is BP treated: Yes     HDL Cholesterol: 55.2 mg/dL     Total Cholesterol: 242 mg/dL  Low cholesterol diet and exercise.  Follow lipid panel.       Relevant Medications   hydrochlorothiazide (HYDRODIURIL) 25 MG tablet   Other Relevant Orders   Basic Metabolic Panel (BMET)   Lipid Profile   Hepatic function panel   Hyperglycemia    Low carb diet and exercise.  Follow met b and a1c.  Lab Results  Component Value Date   HGBA1C 6.0 07/01/2022       Relevant Orders   HgB A1c   Hypothyroid    On thyroid replacement.  Follow tsh.       Relevant Medications   ARMOUR THYROID 90 MG tablet   Knee pain, bilateral    Seeing ortho.  On meloxicam.  GFR 93.  Follow. Pain much improved.       Stress    Continue zoloft.  Appears to be handling things well.  Follow.       Vitamin D deficiency    Recheck vitamin  D level with next labs.        Relevant Orders   VITAMIN D 25 Hydroxy (Vit-D Deficiency, Fractures)  Other Visit Diagnoses     Encounter for screening mammogram for malignant neoplasm of breast       Relevant Orders   MM 3D SCREEN BREAST BILATERAL       Dale Christopher, MD

## 2022-07-06 NOTE — Assessment & Plan Note (Signed)
Recent liver check wnl.  

## 2022-07-06 NOTE — Assessment & Plan Note (Signed)
The 10-year ASCVD risk score (Arnett DK, et al., 2019) is: 6.6%   Values used to calculate the score:     Age: 62 years     Sex: Female     Is Non-Hispanic African American: No     Diabetic: No     Tobacco smoker: No     Systolic Blood Pressure: 262 mmHg     Is BP treated: Yes     HDL Cholesterol: 55.2 mg/dL     Total Cholesterol: 242 mg/dL  Low cholesterol diet and exercise.  Follow lipid panel.

## 2022-07-29 ENCOUNTER — Other Ambulatory Visit: Payer: Self-pay | Admitting: Internal Medicine

## 2022-08-18 ENCOUNTER — Other Ambulatory Visit: Payer: Self-pay | Admitting: Internal Medicine

## 2022-09-02 DIAGNOSIS — M5412 Radiculopathy, cervical region: Secondary | ICD-10-CM | POA: Diagnosis not present

## 2022-09-02 DIAGNOSIS — M5416 Radiculopathy, lumbar region: Secondary | ICD-10-CM | POA: Diagnosis not present

## 2022-09-28 ENCOUNTER — Other Ambulatory Visit: Payer: Self-pay | Admitting: Internal Medicine

## 2022-10-06 ENCOUNTER — Ambulatory Visit
Admission: RE | Admit: 2022-10-06 | Discharge: 2022-10-06 | Disposition: A | Discharge status: Home / Self Care | Payer: BC Managed Care – PPO | Source: Ambulatory Visit | Attending: Internal Medicine | Admitting: Internal Medicine

## 2022-10-06 DIAGNOSIS — Z1231 Encounter for screening mammogram for malignant neoplasm of breast: Secondary | ICD-10-CM

## 2022-10-23 ENCOUNTER — Other Ambulatory Visit: Payer: Self-pay | Admitting: Internal Medicine

## 2022-11-01 ENCOUNTER — Other Ambulatory Visit (INDEPENDENT_AMBULATORY_CARE_PROVIDER_SITE_OTHER): Payer: BC Managed Care – PPO

## 2022-11-01 DIAGNOSIS — R739 Hyperglycemia, unspecified: Secondary | ICD-10-CM

## 2022-11-01 DIAGNOSIS — E559 Vitamin D deficiency, unspecified: Secondary | ICD-10-CM | POA: Diagnosis not present

## 2022-11-01 DIAGNOSIS — E7800 Pure hypercholesterolemia, unspecified: Secondary | ICD-10-CM | POA: Diagnosis not present

## 2022-11-01 LAB — HEPATIC FUNCTION PANEL
ALT: 30 U/L (ref 0–35)
AST: 32 U/L (ref 0–37)
Albumin: 4.1 g/dL (ref 3.5–5.2)
Alkaline Phosphatase: 59 U/L (ref 39–117)
Bilirubin, Direct: 0.1 mg/dL (ref 0.0–0.3)
Total Bilirubin: 0.6 mg/dL (ref 0.2–1.2)
Total Protein: 6.7 g/dL (ref 6.0–8.3)

## 2022-11-01 LAB — LIPID PANEL
Cholesterol: 213 mg/dL — ABNORMAL HIGH (ref 0–200)
HDL: 51.8 mg/dL (ref >39.00)
NonHDL: 160.85
Total CHOL/HDL Ratio: 4
Triglycerides: 223 mg/dL — ABNORMAL HIGH (ref 0.0–149.0)
VLDL: 44.6 mg/dL — ABNORMAL HIGH (ref 0.0–40.0)

## 2022-11-01 LAB — BASIC METABOLIC PANEL
BUN: 15 mg/dL (ref 6–23)
CO2: 28 mEq/L (ref 19–32)
Calcium: 9.2 mg/dL (ref 8.4–10.5)
Chloride: 100 mEq/L (ref 96–112)
Creatinine, Ser: 0.66 mg/dL (ref 0.40–1.20)
GFR: 93.85 mL/min (ref >60.00)
Glucose, Bld: 112 mg/dL — ABNORMAL HIGH (ref 70–99)
Potassium: 4.3 mEq/L (ref 3.5–5.1)
Sodium: 140 mEq/L (ref 135–145)

## 2022-11-01 LAB — LDL CHOLESTEROL, DIRECT: Direct LDL: 145 mg/dL

## 2022-11-01 LAB — VITAMIN D 25 HYDROXY (VIT D DEFICIENCY, FRACTURES): VITD: 24.22 ng/mL — ABNORMAL LOW (ref 30.00–100.00)

## 2022-11-01 LAB — HEMOGLOBIN A1C: Hgb A1c MFr Bld: 5.9 % (ref 4.6–6.5)

## 2022-11-04 ENCOUNTER — Ambulatory Visit (INDEPENDENT_AMBULATORY_CARE_PROVIDER_SITE_OTHER): Payer: BC Managed Care – PPO | Admitting: Internal Medicine

## 2022-11-04 ENCOUNTER — Encounter: Payer: Self-pay | Admitting: Internal Medicine

## 2022-11-04 VITALS — BP 128/74 | HR 70 | Temp 98.2°F | Resp 16 | Ht 66.0 in | Wt 207.4 lb

## 2022-11-04 DIAGNOSIS — E7800 Pure hypercholesterolemia, unspecified: Secondary | ICD-10-CM

## 2022-11-04 DIAGNOSIS — E559 Vitamin D deficiency, unspecified: Secondary | ICD-10-CM

## 2022-11-04 DIAGNOSIS — R739 Hyperglycemia, unspecified: Secondary | ICD-10-CM

## 2022-11-04 DIAGNOSIS — M25561 Pain in right knee: Secondary | ICD-10-CM

## 2022-11-04 DIAGNOSIS — Z0000 Encounter for general adult medical examination without abnormal findings: Secondary | ICD-10-CM

## 2022-11-04 DIAGNOSIS — M25562 Pain in left knee: Secondary | ICD-10-CM

## 2022-11-04 DIAGNOSIS — E039 Hypothyroidism, unspecified: Secondary | ICD-10-CM

## 2022-11-04 DIAGNOSIS — M7989 Other specified soft tissue disorders: Secondary | ICD-10-CM

## 2022-11-04 DIAGNOSIS — K219 Gastro-esophageal reflux disease without esophagitis: Secondary | ICD-10-CM

## 2022-11-04 DIAGNOSIS — F439 Reaction to severe stress, unspecified: Secondary | ICD-10-CM

## 2022-11-04 MED ORDER — ESTRADIOL 0.5 MG PO TABS: 0.5000 mg | ORAL_TABLET | Freq: Every day | ORAL | 1 refills | Status: DC

## 2022-11-04 MED ORDER — SERTRALINE HCL 50 MG PO TABS: ORAL_TABLET | ORAL | 1 refills | Status: DC

## 2022-11-04 MED ORDER — HYDROCHLOROTHIAZIDE 25 MG PO TABS: ORAL_TABLET | ORAL | 1 refills | Status: DC

## 2022-11-04 MED ORDER — PROGESTERONE MICRONIZED 100 MG PO CAPS: ORAL_CAPSULE | ORAL | 1 refills | Status: DC

## 2022-11-04 MED ORDER — ARMOUR THYROID 90 MG PO TABS: ORAL_TABLET | ORAL | 1 refills | Status: DC

## 2022-11-04 NOTE — Assessment & Plan Note (Addendum)
Physical today 11/04/22.  PAP 09/16/21 - negative with negative HPV. Colonoscopy 09/2016 - TA.  F/u in 5 years. Due.  Mammogram 10/07/22 - Bir[facility] I.

## 2022-11-04 NOTE — Progress Notes (Signed)
Subjective:    Patient ID: Carol Jimenez, female    DOB: 06/15/1961, 61 y.o.   MRN: [id number]  Patient here for  Chief Complaint  Patient presents with   Annual Exam    HPI Here for a physical exam.  S/p knee replacement 09/2021. Some increased pain - knees.  Has seen ortho - planning gel injections.  F/u 10/2022.  Handling stress. On zoloft.  Stable.  No chest pain or sob reported.  No cough or congestion.  No abdominal pain or bowel change reported. Does report - swelling - lower extremities.  Better today.  Discussed labs.     Past Medical History:  Diagnosis Date   Allergy    Hx: UTI (urinary tract infection)    Hypothyroidism    Past Surgical History:  Procedure Laterality Date   BREAST BIOPSY Left 2015   negative, stereotactic biopsy   CESAREAN SECTION     ENDOMETRIAL ABLATION  2000   KNEE SURGERY Left    Family History  Problem Relation Age of Onset   Congestive Heart Failure Mother    Diabetes Mother    COPD Mother    Hypertension Father    Esophageal cancer Father 6       treated at [facility]   Colon polyps Other    Hypertension Sister    Hypertension Sister    Diabetes Sister    Breast cancer Neg Hx    Social History   Socioeconomic History   Marital status: Married    Spouse name: Not on file   Number of children: Not on file   Years of education: Not on file   Highest education level: Not on file  Occupational History   Not on file  Tobacco Use   Smoking status: Never   Smokeless tobacco: Never  Vaping Use   Vaping Use: Never used  Substance and Sexual Activity   Alcohol use: Yes    Alcohol/week: 0.0 standard drinks of alcohol    Comment: rare   Drug use: No   Sexual activity: Not on file  Other Topics Concern   Not on file  Social History Narrative   Not on file   Social Determinants of Health   Financial Resource Strain: Not on file  Food Insecurity: Not on file  Transportation Needs: Not on file  Physical Activity: Not on file   Stress: Not on file  Social Connections: Not on file     Review of Systems  Constitutional:  Negative for appetite change and unexpected weight change.  HENT:  Negative for congestion, sinus pressure and sore throat.   Eyes:  Negative for pain and visual disturbance.  Respiratory:  Negative for cough, chest tightness and shortness of breath.   Cardiovascular:  Negative for chest pain and palpitations.  Gastrointestinal:  Negative for abdominal pain, diarrhea, nausea and vomiting.  Genitourinary:  Negative for difficulty urinating and dysuria.  Musculoskeletal:  Negative for myalgias.       Knee pain as outlined.   Skin:  Negative for color change and rash.  Neurological:  Negative for dizziness and headaches.  Hematological:  Negative for adenopathy. Does not bruise/bleed easily.  Psychiatric/Behavioral:  Negative for agitation and dysphoric mood.        Objective:     BP 128/74   Pulse 70   Temp 98.2 F (36.8 C)   Resp 16   Ht 5\' 6"  (1.676 m)   Wt 207 lb 6.4 oz (94.1 kg)  LMP 12/26/2012   SpO2 98%   BMI 33.48 kg/m  Wt Readings from Last 3 Encounters:  11/04/22 207 lb 6.4 oz (94.1 kg)  07/06/22 198 lb 3.2 oz (89.9 kg)  03/24/22 205 lb 3.2 oz (93.1 kg)    Physical Exam Vitals reviewed.  Constitutional:      General: She is not in acute distress.    Appearance: Normal appearance. She is well-developed.  HENT:     Head: Normocephalic and atraumatic.     Right Ear: External ear normal.     Left Ear: External ear normal.  Eyes:     General: No scleral icterus.       Right eye: No discharge.        Left eye: No discharge.     Conjunctiva/sclera: Conjunctivae normal.  Neck:     Thyroid: No thyromegaly.  Cardiovascular:     Rate and Rhythm: Normal rate and regular rhythm.  Pulmonary:     Effort: No tachypnea, accessory muscle usage or respiratory distress.     Breath sounds: Normal breath sounds. No decreased breath sounds or wheezing.  Chest:  Breasts:     Right: No inverted nipple, mass, nipple discharge or tenderness (no axillary adenopathy).     Left: No inverted nipple, mass, nipple discharge or tenderness (no axilarry adenopathy).  Abdominal:     General: Bowel sounds are normal.     Palpations: Abdomen is soft.     Tenderness: There is no abdominal tenderness.  Musculoskeletal:        General: No swelling or tenderness.     Cervical back: Neck supple.  Lymphadenopathy:     Cervical: No cervical adenopathy.  Skin:    Findings: No erythema or rash.  Neurological:     Mental Status: She is alert and oriented to person, place, and time.  Psychiatric:        Mood and Affect: Mood normal.        Behavior: Behavior normal.      Outpatient Encounter Medications as of 11/04/2022  Medication Sig   ARMOUR THYROID 90 MG tablet TAKE 1 TABLET(90 MG) BY MOUTH DAILY   estradiol (ESTRACE) 0.1 MG/GM vaginal cream INSERT 1 APPLICATORFUL VAGINALLY 2 TIMES A WEEK   estradiol (ESTRACE) 0.5 MG tablet Take 1-2 tablets (0.5-1 mg total) by mouth daily.   fluticasone (FLONASE) 50 MCG/ACT nasal spray Place 2 sprays into both nostrils daily.   hydrochlorothiazide (HYDRODIURIL) 25 MG tablet TAKE 1 TABLET(25 MG) BY MOUTH DAILY   meloxicam (MOBIC) 7.5 MG tablet Take 15 mg by mouth daily as needed for pain.   pantoprazole (PROTONIX) 20 MG tablet TAKE 1 TABLET(20 MG) BY MOUTH DAILY   progesterone (PROMETRIUM) 100 MG capsule TAKE 1 CAPSULE(100 MG) BY MOUTH DAILY   sertraline (ZOLOFT) 50 MG tablet TAKE 1 TABLET(50 MG) BY MOUTH DAILY   tretinoin (RETIN-A) 0.025 % cream Apply 1 application topically as needed.   [DISCONTINUED] ARMOUR THYROID 90 MG tablet TAKE 1 TABLET(90 MG) BY MOUTH DAILY   [DISCONTINUED] estradiol (ESTRACE) 0.5 MG tablet TAKE 1 TO 2 TABLETS BY MOUTH EVERY DAY   [DISCONTINUED] hydrochlorothiazide (HYDRODIURIL) 25 MG tablet TAKE 1 TABLET(25 MG) BY MOUTH DAILY   [DISCONTINUED] progesterone (PROMETRIUM) 100 MG capsule TAKE 1 CAPSULE(100 MG) BY MOUTH  DAILY   [DISCONTINUED] sertraline (ZOLOFT) 50 MG tablet TAKE 1 TABLET(50 MG) BY MOUTH DAILY   No facility-administered encounter medications on file as of 11/04/2022.     Lab Results  Component Value Date  WBC 5.9 07/01/2022   HGB 15.4 (H) 07/01/2022   HCT 46.0 07/01/2022   PLT 214.0 07/01/2022   GLUCOSE 112 (H) 11/01/2022   CHOL 213 (H) 11/01/2022   TRIG 223.0 (H) 11/01/2022   HDL 51.80 11/01/2022   LDLDIRECT 145.0 11/01/2022   LDLCALC 147 (H) 07/01/2022   ALT 30 11/01/2022   AST 32 11/01/2022   NA 140 11/01/2022   K 4.3 11/01/2022   CL 100 11/01/2022   CREATININE 0.66 11/01/2022   BUN 15 11/01/2022   CO2 28 11/01/2022   TSH 1.30 07/01/2022   HGBA1C 5.9 11/01/2022    MM 3D SCREEN BREAST BILATERAL  Result Date: 10/07/2022 CLINICAL DATA:  Screening. EXAM: DIGITAL SCREENING BILATERAL MAMMOGRAM WITH TOMOSYNTHESIS AND CAD TECHNIQUE: Bilateral screening digital craniocaudal and mediolateral oblique mammograms were obtained. Bilateral screening digital breast tomosynthesis was performed. The images were evaluated with computer-aided detection. COMPARISON:  Previous exam(s). ACR Breast Density Category b: There are scattered areas of fibroglandular density. FINDINGS: There are no findings suspicious for malignancy. IMPRESSION: No mammographic evidence of malignancy. A result letter of this screening mammogram will be mailed directly to the patient. RECOMMENDATION: Screening mammogram in one year. (Code:SM-B-01Y) BI-RADS CATEGORY  1: Negative. Electronically Signed   By: Harmon Pier M.D.   On: 10/07/2022 12:51       Assessment & Plan:  Routine general medical examination at a health care facility  Health care maintenance Assessment & Plan: Physical today 11/04/22.  PAP 09/16/21 - negative with negative HPV. Colonoscopy 09/2016 - TA.  F/u in 5 years. Due.  Mammogram 10/07/22 - Bir[facility] I.    Hyperglycemia Assessment & Plan: Low carb diet and exercise.  Follow met b and a1c.  Lab Results   Component Value Date   HGBA1C 5.9 11/01/2022    Orders: -     Hemoglobin A1c; Future  Hypercholesterolemia Assessment & Plan: The 10-year ASCVD risk score (Arnett DK, et al., 2019) is: 5.9%   Values used to calculate the score:     Age: 75 years     Sex: Female     Is Non-Hispanic African American: No     Diabetic: No     Tobacco smoker: No     Systolic Blood Pressure: 128 mmHg     Is BP treated: Yes     HDL Cholesterol: 51.8 mg/dL     Total Cholesterol: 213 mg/dL  Low cholesterol diet and exercise.  Follow lipid panel.   Orders: -     Lipid panel; Future -     Hepatic function panel; Future -     Basic metabolic panel; Future -     TSH; Future  Gastroesophageal reflux disease, unspecified whether esophagitis present Assessment & Plan: No upper symptoms reported.  On protonix 20mg  q day.    Hypothyroidism, unspecified type Assessment & Plan: On thyroid replacement.  Follow tsh.    Pain in both knees, unspecified chronicity Assessment & Plan: Seeing ortho.  On meloxicam. Unable to function well without meloxicam.  Discussed goal for least affective dose.  Follow.  Continue f/u with ortho.  Plan gel injections.     Stress Assessment & Plan: Continue zoloft.  Appears to be handling things well.  Follow.    Vitamin D deficiency Assessment & Plan: Discussed recent labs.  Increase vitamin D3 to 2000 units per day.   Orders: -     VITAMIN D 25 Hydroxy (Vit-D Deficiency, Fractures); Future  Leg swelling Assessment & Plan: No significant swelling  today on exam.  She reports it is better.  Avoid increased sodium.  Leg elevation.  Compression hose.  Follow.    Other orders -     Armour Thyroid; TAKE 1 TABLET(90 MG) BY MOUTH DAILY  Dispense: 90 tablet; Refill: 1 -     Estradiol; Take 1-2 tablets (0.5-1 mg total) by mouth daily.  Dispense: 180 tablet; Refill: 1 -     hydroCHLOROthiazide; TAKE 1 TABLET(25 MG) BY MOUTH DAILY  Dispense: 90 tablet; Refill: 1 -      Progesterone; TAKE 1 CAPSULE(100 MG) BY MOUTH DAILY  Dispense: 90 capsule; Refill: 1 -     Sertraline HCl; TAKE 1 TABLET(50 MG) BY MOUTH DAILY  Dispense: 90 tablet; Refill: 1     Dale [place], MD

## 2022-11-07 ENCOUNTER — Encounter: Payer: Self-pay | Admitting: Internal Medicine

## 2022-11-07 DIAGNOSIS — M7989 Other specified soft tissue disorders: Secondary | ICD-10-CM | POA: Insufficient documentation

## 2022-11-07 NOTE — Assessment & Plan Note (Signed)
Continue zoloft.  Appears to be handling things well.  Follow.  °

## 2022-11-07 NOTE — Assessment & Plan Note (Signed)
The 10-year ASCVD risk score (Arnett DK, et al., 2019) is: 5.9%   Values used to calculate the score:     Age: 62 years     Sex: Female     Is Non-Hispanic African American: No     Diabetic: No     Tobacco smoker: No     Systolic Blood Pressure: 128 mmHg     Is BP treated: Yes     HDL Cholesterol: 51.8 mg/dL     Total Cholesterol: 213 mg/dL  Low cholesterol diet and exercise.  Follow lipid panel.

## 2022-11-07 NOTE — Assessment & Plan Note (Signed)
No significant swelling today on exam.  She reports it is better.  Avoid increased sodium.  Leg elevation.  Compression hose.  Follow.

## 2022-11-07 NOTE — Assessment & Plan Note (Signed)
Seeing ortho.  On meloxicam. Unable to function well without meloxicam.  Discussed goal for least affective dose.  Follow.  Continue f/u with ortho.  Plan gel injections.

## 2022-11-07 NOTE — Assessment & Plan Note (Signed)
Low carb diet and exercise.  Follow met b and a1c.  Lab Results  Component Value Date   HGBA1C 5.9 11/01/2022

## 2022-11-07 NOTE — Assessment & Plan Note (Signed)
Discussed recent labs.  Increase vitamin D3 to 2000 units per day.

## 2022-11-07 NOTE — Assessment & Plan Note (Signed)
No upper symptoms reported.  On protonix 20mg  q day.

## 2022-11-07 NOTE — Assessment & Plan Note (Signed)
On thyroid replacement.  Follow tsh.  

## 2022-11-15 DIAGNOSIS — M1712 Unilateral primary osteoarthritis, left knee: Secondary | ICD-10-CM | POA: Diagnosis not present

## 2022-11-25 DIAGNOSIS — M5416 Radiculopathy, lumbar region: Secondary | ICD-10-CM | POA: Diagnosis not present

## 2022-11-25 DIAGNOSIS — M545 Low back pain, unspecified: Secondary | ICD-10-CM | POA: Diagnosis not present

## 2022-12-08 DIAGNOSIS — M5416 Radiculopathy, lumbar region: Secondary | ICD-10-CM | POA: Diagnosis not present

## 2022-12-20 ENCOUNTER — Telehealth: Payer: BC Managed Care – PPO | Admitting: Physician Assistant

## 2022-12-20 DIAGNOSIS — R3989 Other symptoms and signs involving the genitourinary system: Secondary | ICD-10-CM | POA: Diagnosis not present

## 2022-12-20 MED ORDER — CEPHALEXIN 500 MG PO CAPS
500.0000 mg | ORAL_CAPSULE | Freq: Two times a day (BID) | ORAL | 0 refills | Status: DC
Start: 2022-12-20 — End: 2023-01-23

## 2022-12-20 NOTE — Progress Notes (Signed)

## 2023-01-23 ENCOUNTER — Ambulatory Visit
Admission: EM | Admit: 2023-01-23 | Discharge: 2023-01-23 | Disposition: A | Discharge status: Home / Self Care | Payer: BC Managed Care – PPO | Attending: Emergency Medicine | Admitting: Emergency Medicine

## 2023-01-23 DIAGNOSIS — J020 Streptococcal pharyngitis: Secondary | ICD-10-CM | POA: Diagnosis not present

## 2023-01-23 LAB — POCT RAPID STREP A (OFFICE): Rapid Strep A Screen: POSITIVE — AB

## 2023-01-23 MED ORDER — AMOXICILLIN 500 MG PO CAPS: 500.0000 mg | ORAL_CAPSULE | Freq: Two times a day (BID) | ORAL | 0 refills | Status: AC

## 2023-01-23 NOTE — Discharge Instructions (Addendum)
Take the amoxicillin as directed for strep throat.    Follow up with your primary care provider if your symptoms are not improving.    

## 2023-01-23 NOTE — ED Provider Notes (Signed)
Carol Jimenez    CSN: [id number] Arrival date & time: 01/23/23  [id number]      History   Chief Complaint Chief Complaint  Patient presents with   Nausea   Sore Throat   Headache    HPI Carol Jimenez is a 62 y.o. female.  Patient presents with sore throat, headache, nausea x 3 days.  Her sore throat is worse this morning.  No OTC medications taken today.  No fever, rash, cough, shortness of breath, vomiting, diarrhea, or other symptoms.  Negative COVID test at home.   The history is provided by the patient and medical records.    Past Medical History:  Diagnosis Date   Allergy    Hx: UTI (urinary tract infection)    Hypothyroidism     Patient Active Problem List   Diagnosis Date Noted   Leg swelling 11/07/2022   Swelling of right lower extremity 01/18/2022   Lumbosacral radiculopathy at L5 12/19/2021   Pre-op evaluation 09/17/2021   Vaginal dryness 05/05/2020   Left ankle pain 02/04/2020   Acid reflux 11/19/2019   Change in hearing 11/19/2019   Chest pain 11/08/2019   Knee pain, bilateral 07/03/2019   Sinusitis 04/28/2019   Congestion of nasal sinus 04/22/2019   Toe pain, left 01/22/2018   Hyperglycemia 06/13/2016   Right knee pain 08/17/2015   Stress 03/23/2015   Health care maintenance 12/24/2014   History of non anemic vitamin B12 deficiency 12/23/2014   Vitamin D deficiency 12/23/2014   Rib pain on right side 12/23/2014   Pseudoangiomatous stromal hyperplasia of breast 08/12/2014   BMI 33.0-33.9,adult 04/13/2014   Abnormal liver function tests 04/13/2014   Hypothyroid 11/12/2013   Menopausal symptoms 11/12/2013   Side pain 11/12/2013   Fatigue 11/12/2013   Daytime somnolence 11/12/2013   Environmental allergies 11/12/2013   Hypercholesterolemia 11/12/2013    Past Surgical History:  Procedure Laterality Date   BREAST BIOPSY Left 2015   negative, stereotactic biopsy   CESAREAN SECTION     ENDOMETRIAL ABLATION  2000   KNEE SURGERY Left      OB History     Gravida  2   Para  2   Term      Preterm      AB      Living  2      SAB      IAB      Ectopic      Multiple      Live Births           Obstetric Comments  1st Menstrual Cycle:  11  1st Pregnancy:  27          Home Medications    Prior to Admission medications   Medication Sig Start Date End Date Taking? Authorizing Provider  amoxicillin (AMOXIL) 500 MG capsule Take 1 capsule (500 mg total) by mouth 2 (two) times daily for 10 days. 01/23/23 02/02/23 Yes Mickie Bail, NP  ARMOUR THYROID 90 MG tablet TAKE 1 TABLET(90 MG) BY MOUTH DAILY 11/04/22   Dale [place], MD  estradiol (ESTRACE) 0.1 MG/GM vaginal cream INSERT 1 APPLICATORFUL VAGINALLY 2 TIMES A WEEK 01/14/22   Dale [place], MD  estradiol (ESTRACE) 0.5 MG tablet Take 1-2 tablets (0.5-1 mg total) by mouth daily. 11/04/22   Dale , MD  fluticasone (FLONASE) 50 MCG/ACT nasal spray Place 2 sprays into both nostrils daily. 06/27/20   Muthersbaugh, Dahlia Client, PA-C  hydrochlorothiazide (HYDRODIURIL) 25 MG tablet TAKE 1 TABLET(25 MG) BY  MOUTH DAILY 11/04/22   Dale [place], MD  meloxicam (MOBIC) 7.5 MG tablet Take 15 mg by mouth daily as needed for pain. 01/20/20   [provider]  pantoprazole (PROTONIX) 20 MG tablet TAKE 1 TABLET(20 MG) BY MOUTH DAILY 10/24/22   Dale [place], MD  progesterone (PROMETRIUM) 100 MG capsule TAKE 1 CAPSULE(100 MG) BY MOUTH DAILY 11/04/22   Dale [place], MD  sertraline (ZOLOFT) 50 MG tablet TAKE 1 TABLET(50 MG) BY MOUTH DAILY 11/04/22   Dale , MD  tretinoin (RETIN-A) 0.025 % cream Apply 1 application topically as needed. 11/21/19   [provider]    Family History Family History  Problem Relation Age of Onset   Congestive Heart Failure Mother    Diabetes Mother    COPD Mother    Hypertension Father    Esophageal cancer Father 44       treated at [facility]   Colon polyps Other    Hypertension Sister    Hypertension Sister     Diabetes Sister    Breast cancer Neg Hx     Social History Social History   Tobacco Use   Smoking status: Never   Smokeless tobacco: Never  Vaping Use   Vaping status: Never Used  Substance Use Topics   Alcohol use: Yes    Alcohol/week: 0.0 standard drinks of alcohol    Comment: rare   Drug use: No     Allergies   Sulfa antibiotics   Review of Systems Review of Systems  HENT:  Positive for sore throat.   Gastrointestinal:  Positive for nausea.  Neurological:  Positive for headaches.     Physical Exam Triage Vital Signs ED Triage Vitals  Encounter Vitals Group     BP 01/23/23 0819 118/82     Systolic BP Percentile --      Diastolic BP Percentile --      Pulse Rate 01/23/23 0817 82     Resp 01/23/23 0817 18     Temp 01/23/23 0817 98.6 F (37 C)     Temp src --      SpO2 01/23/23 0817 97 %     Weight --      Height --      Head Circumference --      Peak Flow --      Pain Score 01/23/23 0818 9     Pain Loc --      Pain Education --      Exclude from Growth Chart --    No data found.  Updated Vital Signs BP 118/82   Pulse 82   Temp 98.6 F (37 C)   Resp 18   LMP 12/26/2012   SpO2 97%   Visual Acuity Right Eye Distance:   Left Eye Distance:   Bilateral Distance:    Right Eye Near:   Left Eye Near:    Bilateral Near:     Physical Exam Vitals and nursing note reviewed.  Constitutional:      General: She is not in acute distress.    Appearance: She is well-developed.  HENT:     Right Ear: Tympanic membrane normal.     Left Ear: Tympanic membrane normal.     Nose: Nose normal.     Mouth/Throat:     Mouth: Mucous membranes are moist.     Pharynx: Posterior oropharyngeal erythema present.  Cardiovascular:     Rate and Rhythm: Normal rate and regular rhythm.     Heart sounds: Normal  heart sounds.  Pulmonary:     Effort: Pulmonary effort is normal. No respiratory distress.     Breath sounds: Normal breath sounds.  Abdominal:     General:  Bowel sounds are normal.     Palpations: Abdomen is soft.     Tenderness: There is no abdominal tenderness. There is no guarding or rebound.  Musculoskeletal:     Cervical back: Neck supple.  Skin:    General: Skin is warm and dry.  Neurological:     Mental Status: She is alert.      UC Treatments / Results  Labs (all labs ordered are listed, but only abnormal results are displayed) Labs Reviewed  POCT RAPID STREP A (OFFICE) - Abnormal; Notable for the following components:      Result Value   Rapid Strep A Screen Positive (*)    All other components within normal limits    EKG   Radiology No results found.  Procedures Procedures (including critical care time)  Medications Ordered in UC Medications - No data to display  Initial Impression / Assessment and Plan / UC Course  I have reviewed the triage vital signs and the nursing notes.  Pertinent labs & imaging results that were available during my care of the patient were reviewed by me and considered in my medical decision making (see chart for details).    Strep pharyngitis.  Rapid strep positive.  Treating with amoxicillin.  Tylenol as needed.  Instructed patient to follow up with her PCP if her symptoms are not improving.  She agrees to plan of care.   Final Clinical Impressions(s) / UC Diagnoses   Final diagnoses:  Strep pharyngitis     Discharge Instructions      Take the amoxicillin as directed for strep throat.  Follow up with your primary care provider if your symptoms are not improving.        ED Prescriptions     Medication Sig Dispense Auth. Provider   amoxicillin (AMOXIL) 500 MG capsule Take 1 capsule (500 mg total) by mouth 2 (two) times daily for 10 days. 20 capsule Mickie Bail, NP      PDMP not reviewed this encounter.   Mickie Bail, NP 01/23/23 [phone]

## 2023-01-23 NOTE — ED Triage Notes (Signed)
Patient to Urgent Care with complaints of sore throat/ headache/ nausea.  Symptoms started three days ago. Denies any known fevers. No known sick contacts.  Taking sudafed/ extra strength tylenol.

## 2023-02-10 ENCOUNTER — Encounter (INDEPENDENT_AMBULATORY_CARE_PROVIDER_SITE_OTHER): Payer: Self-pay

## 2023-03-03 ENCOUNTER — Telehealth: Payer: BC Managed Care – PPO | Admitting: Physician Assistant

## 2023-03-03 DIAGNOSIS — R21 Rash and other nonspecific skin eruption: Secondary | ICD-10-CM

## 2023-03-03 MED ORDER — PREDNISONE 10 MG (21) PO TBPK
ORAL_TABLET | ORAL | 0 refills | Status: DC
Start: 2023-03-03 — End: 2023-07-12

## 2023-03-03 NOTE — Progress Notes (Signed)
E Visit for Rash  We are sorry that you are not feeling well. Here is how we plan to help!  Based on what you shared with me it looks like you have contact dermatitis.  Contact dermatitis is a skin rash caused by something that touches the skin and causes irritation or inflammation.  Your skin may be red, swollen, dry, cracked, and itch.  The rash should go away in a few days but can last a few weeks.  If you get a rash, it's important to figure out what caused it so the irritant can be avoided in the future.  Prednisone 10 mg daily for 6 days (see taper instructions below)  Directions for 6 day taper: Day 1: 2 tablets before breakfast, 1 after both lunch & dinner and 2 at bedtime Day 2: 1 tab before breakfast, 1 after both lunch & dinner and 2 at bedtime Day 3: 1 tab at each meal & 1 at bedtime Day 4: 1 tab at breakfast, 1 at lunch, 1 at bedtime Day 5: 1 tab at breakfast & 1 tab at bedtime Day 6: 1 tab at breakfast   HOME CARE:  Take cool showers and avoid direct sunlight. Apply cool compress or wet dressings. Take a bath in an oatmeal bath.  Sprinkle content of one Aveeno packet under running faucet with comfortably warm water.  Bathe for 15-20 minutes, 1-2 times daily.  Pat dry with a towel. Do not rub the rash. Use hydrocortisone cream. Take an antihistamine like Benadryl for widespread rashes that itch.  The adult dose of Benadryl is 25-50 mg by mouth 4 times daily. Caution:  This type of medication may cause sleepiness.  Do not drink alcohol, drive, or operate dangerous machinery while taking antihistamines.  Do not take these medications if you have prostate enlargement.  Read package instructions thoroughly on all medications that you take.  GET HELP RIGHT AWAY IF:  Symptoms don't go away after treatment. Severe itching that persists. If you rash spreads or swells. If you rash begins to smell. If it blisters and opens or develops a yellow-brown crust. You develop a  fever. You have a sore throat. You become short of breath.  MAKE SURE YOU:  Understand these instructions. Will watch your condition. Will get help right away if you are not doing well or get worse.  Thank you for choosing an e-visit.  Your e-visit answers were reviewed by a board certified advanced clinical practitioner to complete your personal care plan. Depending upon the condition, your plan could have included both over the counter or prescription medications.  Please review your pharmacy choice. Make sure the pharmacy is open so you can pick up prescription now. If there is a problem, you may contact your provider through [employer] and have the prescription routed to another pharmacy.  Your safety is important to [place]. If you have drug allergies check your prescription carefully.   For the next 24 hours you can use MyChart to ask questions about today's visit, request a non-urgent call back, or ask for a work or school excuse. You will get an email in the next two days asking about your experience. I hope that your e-visit has been valuable and will speed your recovery.  I have spent 5 minutes in review of e-visit questionnaire, review and updating patient chart, medical decision making and response to patient.   Mar Daring, PA-C

## 2023-03-07 ENCOUNTER — Other Ambulatory Visit (INDEPENDENT_AMBULATORY_CARE_PROVIDER_SITE_OTHER): Payer: BC Managed Care – PPO

## 2023-03-07 DIAGNOSIS — E559 Vitamin D deficiency, unspecified: Secondary | ICD-10-CM

## 2023-03-07 DIAGNOSIS — R739 Hyperglycemia, unspecified: Secondary | ICD-10-CM | POA: Diagnosis not present

## 2023-03-07 DIAGNOSIS — E7800 Pure hypercholesterolemia, unspecified: Secondary | ICD-10-CM | POA: Diagnosis not present

## 2023-03-07 LAB — VITAMIN D 25 HYDROXY (VIT D DEFICIENCY, FRACTURES): VITD: 30.23 ng/mL (ref 30.00–100.00)

## 2023-03-07 LAB — BASIC METABOLIC PANEL
BUN: 23 mg/dL (ref 6–23)
CO2: 32 meq/L (ref 19–32)
Calcium: 9.2 mg/dL (ref 8.4–10.5)
Chloride: 100 meq/L (ref 96–112)
Creatinine, Ser: 0.7 mg/dL (ref 0.40–1.20)
GFR: 92.31 mL/min (ref >60.00)
Glucose, Bld: 107 mg/dL — ABNORMAL HIGH (ref 70–99)
Potassium: 4.1 meq/L (ref 3.5–5.1)
Sodium: 138 meq/L (ref 135–145)

## 2023-03-07 LAB — HEPATIC FUNCTION PANEL
ALT: 24 U/L (ref 0–35)
AST: 22 U/L (ref 0–37)
Albumin: 4 g/dL (ref 3.5–5.2)
Alkaline Phosphatase: 52 U/L (ref 39–117)
Bilirubin, Direct: 0.1 mg/dL (ref 0.0–0.3)
Total Bilirubin: 0.7 mg/dL (ref 0.2–1.2)
Total Protein: 6.8 g/dL (ref 6.0–8.3)

## 2023-03-07 LAB — LIPID PANEL
Cholesterol: 216 mg/dL — ABNORMAL HIGH (ref 0–200)
HDL: 60 mg/dL (ref >39.00)
LDL Cholesterol: 123 mg/dL — ABNORMAL HIGH (ref 0–99)
NonHDL: 156.41
Total CHOL/HDL Ratio: 4
Triglycerides: 168 mg/dL — ABNORMAL HIGH (ref 0.0–149.0)
VLDL: 33.6 mg/dL (ref 0.0–40.0)

## 2023-03-07 LAB — TSH: TSH: 2.23 u[IU]/mL (ref 0.35–5.50)

## 2023-03-07 LAB — HEMOGLOBIN A1C: Hgb A1c MFr Bld: 6 % (ref 4.6–6.5)

## 2023-03-09 ENCOUNTER — Ambulatory Visit: Payer: BC Managed Care – PPO | Admitting: Internal Medicine

## 2023-03-09 ENCOUNTER — Encounter: Payer: Self-pay | Admitting: Internal Medicine

## 2023-03-09 VITALS — BP 128/70 | HR 62 | Temp 98.7°F | Ht 66.0 in | Wt 203.8 lb

## 2023-03-09 DIAGNOSIS — R21 Rash and other nonspecific skin eruption: Secondary | ICD-10-CM | POA: Diagnosis not present

## 2023-03-09 DIAGNOSIS — Z136 Encounter for screening for cardiovascular disorders: Secondary | ICD-10-CM | POA: Diagnosis not present

## 2023-03-09 DIAGNOSIS — R739 Hyperglycemia, unspecified: Secondary | ICD-10-CM | POA: Diagnosis not present

## 2023-03-09 DIAGNOSIS — N6489 Other specified disorders of breast: Secondary | ICD-10-CM

## 2023-03-09 DIAGNOSIS — E039 Hypothyroidism, unspecified: Secondary | ICD-10-CM

## 2023-03-09 DIAGNOSIS — M25561 Pain in right knee: Secondary | ICD-10-CM

## 2023-03-09 DIAGNOSIS — R7989 Other specified abnormal findings of blood chemistry: Secondary | ICD-10-CM

## 2023-03-09 DIAGNOSIS — F439 Reaction to severe stress, unspecified: Secondary | ICD-10-CM

## 2023-03-09 DIAGNOSIS — K219 Gastro-esophageal reflux disease without esophagitis: Secondary | ICD-10-CM

## 2023-03-09 DIAGNOSIS — E7800 Pure hypercholesterolemia, unspecified: Secondary | ICD-10-CM

## 2023-03-09 DIAGNOSIS — M25562 Pain in left knee: Secondary | ICD-10-CM

## 2023-03-09 MED ORDER — PANTOPRAZOLE SODIUM 20 MG PO TBEC: 20.0000 mg | DELAYED_RELEASE_TABLET | Freq: Every day | ORAL | 3 refills | Status: DC

## 2023-03-09 MED ORDER — PROGESTERONE MICRONIZED 100 MG PO CAPS: ORAL_CAPSULE | ORAL | 1 refills | Status: DC

## 2023-03-09 MED ORDER — ESTRADIOL 0.5 MG PO TABS: 0.5000 mg | ORAL_TABLET | Freq: Every day | ORAL | 1 refills | Status: DC

## 2023-03-09 MED ORDER — ARMOUR THYROID 90 MG PO TABS: ORAL_TABLET | ORAL | 1 refills | Status: DC

## 2023-03-09 MED ORDER — SERTRALINE HCL 50 MG PO TABS: ORAL_TABLET | ORAL | 1 refills | Status: DC

## 2023-03-09 MED ORDER — HYDROCHLOROTHIAZIDE 25 MG PO TABS: ORAL_TABLET | ORAL | 1 refills | Status: DC

## 2023-03-09 NOTE — Progress Notes (Unsigned)
Subjective:    Patient ID: Carol Jimenez, female    DOB: 05/04/1961, 62 y.o.   MRN: [id number]  Patient here for  Chief Complaint  Patient presents with   Medication Management    HPI Here to follow up regarding hypercholesterolemia, GERD, hypothyroidism and increased stress.  On zoloft.  S/p knee replacement 09/2021. Had increased pain - knees. S/p left knee viscosupplementation injection 11/15/22. Is s/p L5-S1 - ESI - 12/08/22. Had strep throat 01/23/23 - treated with amoxicillin. Evaluated 03/03/23 - diagnosed with contact dermatitis.  She reports was actually a photosensitive rash. Treated with prednisone taper. Still on prednisone.  Has had continued problem with intermittent rash.  Also joint aches and hair loss. Request to be checked for lupus.  No chest pain or sob.  Does report increased anxiety.  Feels more anxious.  Noticed prior to starting prednisone.  On zoloft. Discussed other treatment - increasing zoloft or addition of buspar.  Wants to hold on changing until off prednisone.  Discussed labs.  Discussed calcium score.    Past Medical History:  Diagnosis Date   Allergy    Hx: UTI (urinary tract infection)    Hypothyroidism    Past Surgical History:  Procedure Laterality Date   BREAST BIOPSY Left 2015   negative, stereotactic biopsy   CESAREAN SECTION     ENDOMETRIAL ABLATION  2000   KNEE SURGERY Left    Family History  Problem Relation Age of Onset   Congestive Heart Failure Mother    Diabetes Mother    COPD Mother    Hypertension Father    Esophageal cancer Father 4       treated at [facility]   Colon polyps Other    Hypertension Sister    Hypertension Sister    Diabetes Sister    Breast cancer Neg Hx    Social History   Socioeconomic History   Marital status: Married    Spouse name: Not on file   Number of children: Not on file   Years of education: Not on file   Highest education level: Bachelor's degree (e.g., BA, AB, BS)  Occupational History   Not on  file  Tobacco Use   Smoking status: Never   Smokeless tobacco: Never  Vaping Use   Vaping status: Never Used  Substance and Sexual Activity   Alcohol use: Yes    Alcohol/week: 0.0 standard drinks of alcohol    Comment: rare   Drug use: No   Sexual activity: Not on file  Other Topics Concern   Not on file  Social History Narrative   Not on file   Social Determinants of Health   Financial Resource Strain: Low Risk  (03/02/2023)   Overall Financial Resource Strain (CARDIA)    Difficulty of Paying Living Expenses: Not hard at all  Food Insecurity: No Food Insecurity (03/02/2023)   Hunger Vital Sign    Worried About Running Out of Food in the Last Year: Never true    Ran Out of Food in the Last Year: Never true  Transportation Needs: No Transportation Needs (03/02/2023)   PRAPARE - Administrator, Civil Service (Medical): No    Lack of Transportation (Non-Medical): No  Physical Activity: Insufficiently Active (03/02/2023)   Exercise Vital Sign    Days of Exercise per Week: 2 days    Minutes of Exercise per Session: 20 min  Stress: No Stress Concern Present (03/02/2023)   [employer] of Occupational Health - Occupational Stress  Questionnaire    Feeling of Stress : Only a little  Social Connections: Moderately Integrated (03/02/2023)   Social Connection and Isolation Panel [NHANES]    Frequency of Communication with Friends and Family: Never    Frequency of Social Gatherings with Friends and Family: Twice a week    Attends Religious Services: More than 4 times per year    Active Member of [employer] or Organizations: Yes    Attends Engineer, structural: More than 4 times per year    Marital Status: Married     Review of Systems  Constitutional:  Negative for appetite change and unexpected weight change.  HENT:  Negative for congestion and sinus pressure.   Respiratory:  Negative for cough, chest tightness and shortness of breath.   Cardiovascular:  Negative for  chest pain and palpitations.  Gastrointestinal:  Negative for abdominal pain, diarrhea, nausea and vomiting.  Genitourinary:  Negative for difficulty urinating and dysuria.  Musculoskeletal:  Negative for joint swelling and myalgias.  Skin:  Negative for color change.       On prednisone for rash.   Neurological:  Negative for dizziness and headaches.  Psychiatric/Behavioral:  Negative for agitation and dysphoric mood.        Increased anxiety as outlined.        Objective:     BP 128/70   Pulse 62   Temp 98.7 F (37.1 C) (Oral)   Ht 5\' 6"  (1.676 m)   Wt 203 lb 12.8 oz (92.4 kg)   LMP 12/26/2012   SpO2 97%   BMI 32.89 kg/m  Wt Readings from Last 3 Encounters:  03/09/23 203 lb 12.8 oz (92.4 kg)  11/04/22 207 lb 6.4 oz (94.1 kg)  07/06/22 198 lb 3.2 oz (89.9 kg)    Physical Exam Vitals reviewed.  Constitutional:      General: She is not in acute distress.    Appearance: Normal appearance.  HENT:     Head: Normocephalic and atraumatic.     Right Ear: External ear normal.     Left Ear: External ear normal.  Eyes:     General: No scleral icterus.       Right eye: No discharge.        Left eye: No discharge.     Conjunctiva/sclera: Conjunctivae normal.  Neck:     Thyroid: No thyromegaly.  Cardiovascular:     Rate and Rhythm: Normal rate and regular rhythm.  Pulmonary:     Effort: No respiratory distress.     Breath sounds: Normal breath sounds. No wheezing.  Abdominal:     General: Bowel sounds are normal.     Palpations: Abdomen is soft.     Tenderness: There is no abdominal tenderness.  Musculoskeletal:        General: No swelling or tenderness.     Cervical back: Neck supple. No tenderness.  Lymphadenopathy:     Cervical: No cervical adenopathy.  Skin:    Findings: No erythema or rash.  Neurological:     Mental Status: She is alert.  Psychiatric:        Mood and Affect: Mood normal.        Behavior: Behavior normal.      Outpatient Encounter  Medications as of 03/09/2023  Medication Sig   ARMOUR THYROID 90 MG tablet TAKE 1 TABLET(90 MG) BY MOUTH DAILY   estradiol (ESTRACE) 0.1 MG/GM vaginal cream INSERT 1 APPLICATORFUL VAGINALLY 2 TIMES A WEEK   estradiol (ESTRACE) 0.5 MG  tablet Take 1-2 tablets (0.5-1 mg total) by mouth daily.   fluticasone (FLONASE) 50 MCG/ACT nasal spray Place 2 sprays into both nostrils daily.   hydrochlorothiazide (HYDRODIURIL) 25 MG tablet TAKE 1 TABLET(25 MG) BY MOUTH DAILY   meloxicam (MOBIC) 7.5 MG tablet Take 15 mg by mouth daily as needed for pain.   pantoprazole (PROTONIX) 20 MG tablet Take 1 tablet (20 mg total) by mouth daily.   predniSONE (STERAPRED UNI-PAK 21 TAB) 10 MG (21) TBPK tablet 6 day taper; take as directed on package instructions   progesterone (PROMETRIUM) 100 MG capsule TAKE 1 CAPSULE(100 MG) BY MOUTH DAILY   sertraline (ZOLOFT) 50 MG tablet TAKE 1 TABLET(50 MG) BY MOUTH DAILY   tretinoin (RETIN-A) 0.025 % cream Apply 1 application topically as needed.   [DISCONTINUED] ARMOUR THYROID 90 MG tablet TAKE 1 TABLET(90 MG) BY MOUTH DAILY   [DISCONTINUED] estradiol (ESTRACE) 0.5 MG tablet Take 1-2 tablets (0.5-1 mg total) by mouth daily.   [DISCONTINUED] hydrochlorothiazide (HYDRODIURIL) 25 MG tablet TAKE 1 TABLET(25 MG) BY MOUTH DAILY   [DISCONTINUED] pantoprazole (PROTONIX) 20 MG tablet TAKE 1 TABLET(20 MG) BY MOUTH DAILY   [DISCONTINUED] progesterone (PROMETRIUM) 100 MG capsule TAKE 1 CAPSULE(100 MG) BY MOUTH DAILY   [DISCONTINUED] sertraline (ZOLOFT) 50 MG tablet TAKE 1 TABLET(50 MG) BY MOUTH DAILY   No facility-administered encounter medications on file as of 03/09/2023.     Lab Results  Component Value Date   WBC 5.9 07/01/2022   HGB 15.4 (H) 07/01/2022   HCT 46.0 07/01/2022   PLT 214.0 07/01/2022   GLUCOSE 107 (H) 03/07/2023   CHOL 216 (H) 03/07/2023   TRIG 168.0 (H) 03/07/2023   HDL 60.00 03/07/2023   LDLDIRECT 145.0 11/01/2022   LDLCALC 123 (H) 03/07/2023   ALT 24 03/07/2023    AST 22 03/07/2023   NA 138 03/07/2023   K 4.1 03/07/2023   CL 100 03/07/2023   CREATININE 0.70 03/07/2023   BUN 23 03/07/2023   CO2 32 03/07/2023   TSH 2.23 03/07/2023   HGBA1C 6.0 03/07/2023       Assessment & Plan:  Hyperglycemia Assessment & Plan: Low carb diet and exercise.  Follow met b and a1c.  Lab Results  Component Value Date   HGBA1C 6.0 03/07/2023    Orders: -     Hemoglobin A1c; Future  Hypercholesterolemia Assessment & Plan: The 10-year ASCVD risk score (Arnett DK, et al., 2019) is: 5.2%   Values used to calculate the score:     Age: 36 years     Sex: Female     Is Non-Hispanic African American: No     Diabetic: No     Tobacco smoker: No     Systolic Blood Pressure: 118 mmHg     Is BP treated: Yes     HDL Cholesterol: 60 mg/dL     Total Cholesterol: 216 mg/dL  Low cholesterol diet and exercise.  Follow lipid panel.   Orders: -     Lipid panel; Future -     Hepatic function panel; Future -     Basic metabolic panel; Future -     CBC with Differential/Platelet; Future  Rash -     ANA  Encounter for screening for coronary artery disease Assessment & Plan: Discussed CT calcium score and risk factor modification. Agreeable.    Orders: -     CT CARDIAC SCORING (SELF PAY ONLY); Future  Abnormal liver function tests Assessment & Plan: Recent liver check wnl.  Gastroesophageal reflux disease, unspecified whether esophagitis present Assessment & Plan: No upper symptoms reported.  On protonix 20mg  q day.    Stress Assessment & Plan: Increased stress/anxiety as outlined.  Discussed.  Currently on zoloft.  Noticed prior to starting prednisone.  On zoloft. Discussed other treatment - increasing zoloft or addition of buspar.  Wants to hold on changing until off prednisone.     Pseudoangiomatous stromal hyperplasia of breast Assessment & Plan: Previously saw Dr Lemar Livings.  Mammogram 10/07/22- Birads I.    Pain in both knees, unspecified  chronicity Assessment & Plan: S/p knee replacement 09/2021. Had increased pain - knees. S/p left knee viscosupplementation injection 11/15/22.continue f/u with ortho.    Hypothyroidism, unspecified type Assessment & Plan: On thyroid replacement.  Follow tsh.    Other orders -     Armour Thyroid; TAKE 1 TABLET(90 MG) BY MOUTH DAILY  Dispense: 90 tablet; Refill: 1 -     Estradiol; Take 1-2 tablets (0.5-1 mg total) by mouth daily.  Dispense: 180 tablet; Refill: 1 -     hydroCHLOROthiazide; TAKE 1 TABLET(25 MG) BY MOUTH DAILY  Dispense: 90 tablet; Refill: 1 -     Pantoprazole Sodium; Take 1 tablet (20 mg total) by mouth daily.  Dispense: 90 tablet; Refill: 3 -     Progesterone; TAKE 1 CAPSULE(100 MG) BY MOUTH DAILY  Dispense: 90 capsule; Refill: 1 -     Sertraline HCl; TAKE 1 TABLET(50 MG) BY MOUTH DAILY  Dispense: 90 tablet; Refill: 1     Dale Rupert, MD

## 2023-03-09 NOTE — Assessment & Plan Note (Signed)
The 10-year ASCVD risk score (Arnett DK, et al., 2019) is: 5.2%   Values used to calculate the score:     Age: 62 years     Sex: Female     Is Non-Hispanic African American: No     Diabetic: No     Tobacco smoker: No     Systolic Blood Pressure: 118 mmHg     Is BP treated: Yes     HDL Cholesterol: 60 mg/dL     Total Cholesterol: 216 mg/dL  Low cholesterol diet and exercise.  Follow lipid panel.

## 2023-03-10 LAB — ANA: Anti Nuclear Antibody (ANA): NEGATIVE

## 2023-03-14 ENCOUNTER — Encounter: Payer: Self-pay | Admitting: Internal Medicine

## 2023-03-14 DIAGNOSIS — Z136 Encounter for screening for cardiovascular disorders: Secondary | ICD-10-CM | POA: Insufficient documentation

## 2023-03-14 NOTE — Assessment & Plan Note (Signed)
Recent liver check wnl.

## 2023-03-14 NOTE — Assessment & Plan Note (Signed)
Discussed CT calcium score and risk factor modification. Agreeable.

## 2023-03-14 NOTE — Assessment & Plan Note (Signed)
Previously saw Dr Lemar Livings.  Mammogram 10/07/22- Birads I.

## 2023-03-14 NOTE — Assessment & Plan Note (Signed)
S/p knee replacement 09/2021. Had increased pain - knees. S/p left knee viscosupplementation injection 11/15/22.continue f/u with ortho.

## 2023-03-14 NOTE — Assessment & Plan Note (Signed)
Increased stress/anxiety as outlined.  Discussed.  Currently on zoloft.  Noticed prior to starting prednisone.  On zoloft. Discussed other treatment - increasing zoloft or addition of buspar.  Wants to hold on changing until off prednisone.

## 2023-03-14 NOTE — Assessment & Plan Note (Signed)
On thyroid replacement.  Follow tsh.  

## 2023-03-14 NOTE — Assessment & Plan Note (Signed)
Low carb diet and exercise.  Follow met b and a1c.  Lab Results  Component Value Date   HGBA1C 6.0 03/07/2023

## 2023-03-14 NOTE — Assessment & Plan Note (Signed)
No upper symptoms reported.  On protonix 20mg  q day.

## 2023-03-15 ENCOUNTER — Ambulatory Visit
Admission: RE | Admit: 2023-03-15 | Discharge: 2023-03-15 | Disposition: A | Discharge status: Home / Self Care | Payer: BC Managed Care – PPO | Source: Ambulatory Visit | Attending: Internal Medicine | Admitting: Internal Medicine

## 2023-03-15 DIAGNOSIS — Z136 Encounter for screening for cardiovascular disorders: Secondary | ICD-10-CM | POA: Insufficient documentation

## 2023-03-17 DIAGNOSIS — D2262 Melanocytic nevi of left upper limb, including shoulder: Secondary | ICD-10-CM | POA: Diagnosis not present

## 2023-03-17 DIAGNOSIS — D2261 Melanocytic nevi of right upper limb, including shoulder: Secondary | ICD-10-CM | POA: Diagnosis not present

## 2023-03-17 DIAGNOSIS — D225 Melanocytic nevi of trunk: Secondary | ICD-10-CM | POA: Diagnosis not present

## 2023-03-17 DIAGNOSIS — D2272 Melanocytic nevi of left lower limb, including hip: Secondary | ICD-10-CM | POA: Diagnosis not present

## 2023-04-02 ENCOUNTER — Other Ambulatory Visit: Payer: Self-pay

## 2023-04-02 ENCOUNTER — Emergency Department
Admission: EM | Admit: 2023-04-02 | Discharge: 2023-04-02 | Disposition: A | Discharge status: Home / Self Care | Payer: BC Managed Care – PPO | Attending: Emergency Medicine | Admitting: Emergency Medicine

## 2023-04-02 DIAGNOSIS — S61211A Laceration without foreign body of left index finger without damage to nail, initial encounter: Secondary | ICD-10-CM | POA: Diagnosis not present

## 2023-04-02 DIAGNOSIS — Z23 Encounter for immunization: Secondary | ICD-10-CM | POA: Diagnosis not present

## 2023-04-02 DIAGNOSIS — S6992XA Unspecified injury of left wrist, hand and finger(s), initial encounter: Secondary | ICD-10-CM | POA: Diagnosis not present

## 2023-04-02 DIAGNOSIS — S61213A Laceration without foreign body of left middle finger without damage to nail, initial encounter: Secondary | ICD-10-CM | POA: Diagnosis not present

## 2023-04-02 DIAGNOSIS — E039 Hypothyroidism, unspecified: Secondary | ICD-10-CM | POA: Insufficient documentation

## 2023-04-02 DIAGNOSIS — W293XXA Contact with powered garden and outdoor hand tools and machinery, initial encounter: Secondary | ICD-10-CM | POA: Insufficient documentation

## 2023-04-02 MED ORDER — TETANUS-DIPHTH-ACELL PERTUSSIS 5-2.5-18.5 LF-MCG/0.5 IM SUSY
0.5000 mL | PREFILLED_SYRINGE | Freq: Once | INTRAMUSCULAR | Status: AC
  Administered 2023-04-02: 0.5 mL via INTRAMUSCULAR
  Filled 2023-04-02: qty 0.5

## 2023-04-02 MED ORDER — LIDOCAINE HCL (PF) 1 % IJ SOLN
5.0000 mL | Freq: Once | INTRAMUSCULAR | Status: AC
  Administered 2023-04-02: 5 mL via INTRADERMAL
  Filled 2023-04-02: qty 5

## 2023-04-02 NOTE — ED Provider Notes (Cosign Needed Addendum)
[facility] Provider Note    Event Date/Time   First MD Initiated Contact with Patient 04/02/23 1847     (approximate)   History   Laceration   HPI  Carol Jimenez is a 62 y.o. female with PMH of hypothyroidism presents for evaluation of lacerations to her left fingers.  Patient was using an electric trimmer earlier today when she cut her hand.  She states there is localized pain to the area but is still able to move all her fingers.  She is not sure when her last tetanus shot was.      Physical Exam   Triage Vital Signs: ED Triage Vitals  Encounter Vitals Group     BP 04/02/23 1824 (!) 124/96     Systolic BP Percentile --      Diastolic BP Percentile --      Pulse Rate 04/02/23 1824 81     Resp 04/02/23 1824 18     Temp 04/02/23 1824 98.4 F (36.9 C)     Temp Source 04/02/23 1824 Oral     SpO2 04/02/23 1824 98 %     Weight 04/02/23 1821 200 lb (90.7 kg)     Height 04/02/23 1821 5\' 6"  (1.676 m)     Head Circumference --      Peak Flow --      Pain Score 04/02/23 1821 4     Pain Loc --      Pain Education --      Exclude from Growth Chart --     Most recent vital signs: Vitals:   04/02/23 1824  BP: (!) 124/96  Pulse: 81  Resp: 18  Temp: 98.4 F (36.9 C)  SpO2: 98%    General: Awake, no distress.  CV:  Good peripheral perfusion.  Resp:  Normal effort.  Abd:  No distention.  Other:  Multiple lacerations to the palmar side of the patient's left hand on her fingers, bleeding is controlled.  Radial pulse 2+ and regular, capillary refill appropriate on all fingertips.  Patient is able to fully flex and extend her fingers.  Sensation intact across all dermatomes of the hand.   ED Results / Procedures / Treatments   Labs (all labs ordered are listed, but only abnormal results are displayed) Labs Reviewed - No data to display   PROCEDURES:  Critical Care performed: No  ..Laceration Repair  Date/Time: 04/02/2023 8:58  PM  Performed by: Cameron Ali, PA-C Authorized by: Cameron Ali, PA-C   Consent:    Consent obtained:  Verbal   Consent given by:  Patient   Risks, benefits, and alternatives were discussed: yes     Risks discussed:  Infection and pain   Alternatives discussed:  No treatment Universal protocol:    Patient identity confirmed:  Verbally with patient Anesthesia:    Anesthesia method:  Nerve block   Block location:  Base of index finger   Block needle gauge:  27 G   Block anesthetic:  Lidocaine 1% w/o epi   Block technique:  Flexor tendon sheath   Block injection procedure:  Anatomic landmarks identified, introduced needle and incremental injection   Block outcome:  Anesthesia achieved Laceration details:    Location:  Finger   Finger location:  L index finger   Length (cm):  2   Depth (mm):  3 Pre-procedure details:    Preparation:  Patient was prepped and draped in usual sterile fashion Exploration:    Limited  defect created (wound extended): yes     Hemostasis achieved with:  Direct pressure   Wound exploration: wound explored through full range of motion and entire depth of wound visualized   Treatment:    Area cleansed with:  Povidone-iodine   Amount of cleaning:  Standard   Irrigation solution:  Sterile saline   Irrigation volume:  30 mL   Irrigation method:  Syringe Skin repair:    Repair method:  Sutures   Suture size:  4-0   Suture material:  Nylon   Suture technique:  Simple interrupted   Number of sutures:  2 Approximation:    Approximation:  Close Repair type:    Repair type:  Simple Post-procedure details:    Dressing:  Adhesive bandage   Procedure completion:  Tolerated well, no immediate complications .Marland KitchenLaceration Repair  Date/Time: 04/02/2023 9:01 PM  Performed by: Cameron Ali, PA-C Authorized by: Cameron Ali, PA-C   Consent:    Consent obtained:  Verbal   Consent given by:  Patient   Risks, benefits, and  alternatives were discussed: yes     Risks discussed:  Infection and pain   Alternatives discussed:  No treatment Universal protocol:    Patient identity confirmed:  Verbally with patient Anesthesia:    Anesthesia method:  Nerve block   Block location:  Base of middle finger   Block needle gauge:  27 G   Block anesthetic:  Lidocaine 1% w/o epi   Block technique:  Flexor tendon sheath   Block injection procedure:  Anatomic landmarks identified, introduced needle and incremental injection   Block outcome:  Anesthesia achieved Laceration details:    Location:  Finger   Finger location:  L long finger   Length (cm):  1.5   Depth (mm):  3 Pre-procedure details:    Preparation:  Patient was prepped and draped in usual sterile fashion Exploration:    Hemostasis achieved with:  Direct pressure   Wound exploration: wound explored through full range of motion and entire depth of wound visualized   Treatment:    Area cleansed with:  Povidone-iodine   Amount of cleaning:  Standard   Irrigation solution:  Sterile saline   Irrigation volume:  30 mL   Irrigation method:  Syringe Skin repair:    Repair method:  Sutures   Suture size:  4-0   Suture material:  Nylon   Suture technique:  Simple interrupted   Number of sutures:  2 Approximation:    Approximation:  Close Repair type:    Repair type:  Simple Post-procedure details:    Dressing:  Adhesive bandage   Procedure completion:  Tolerated well, no immediate complications    MEDICATIONS ORDERED IN ED: Medications  lidocaine (PF) (XYLOCAINE) 1 % injection 5 mL (5 mLs Intradermal Given 04/02/23 2017)  Tdap (BOOSTRIX) injection 0.5 mL (0.5 mLs Intramuscular Given 04/02/23 2016)     IMPRESSION / MDM / ASSESSMENT AND PLAN / ED COURSE  I reviewed the triage vital signs and the nursing notes.                             62 year old female presents for evaluation of lacerations to her left fingers.  Patient was mildly hypertensive in  triage otherwise vital signs stable.  Patient NAD on exam.  Differential diagnosis includes, but is not limited to, laceration, tendon injury, nerve injury, retained foreign body.  Patient's presentation is most consistent with acute,  uncomplicated illness.  Laceration repaired as described in the procedure note above.  Patient was advised on wound care.  Tetanus shot was updated today.  Patient will need to have the stitches removed in 7 to 10 days.  She can take Tylenol and ibuprofen as needed.  She voiced understanding, all questions were answered and she was stable at discharge.       FINAL CLINICAL IMPRESSION(S) / ED DIAGNOSES   Final diagnoses:  Laceration of left index finger without foreign body without damage to nail, initial encounter  Laceration of left middle finger without foreign body without damage to nail, initial encounter     Rx / DC Orders   ED Discharge Orders     None        Note:  This document was prepared using Dragon voice recognition software and may include unintentional dictation errors.   Cameron Ali, PA-C 04/02/23 2047    Cameron Ali, PA-C 04/02/23 2103    Trinna Post, MD 04/03/23 0030

## 2023-04-02 NOTE — ED Notes (Signed)
Patient has various lacerations and abrasions noted to the left index, middle, and ring finger.

## 2023-04-02 NOTE — Discharge Instructions (Addendum)
You can take 650 mg of Tylenol and 600 mg of ibuprofen every 6 hours as needed for pain.  Watch for signs of infection including redness, warmth, swelling, pain and pus drainage.  If you develop any of these please return to the ED, urgent care or your primary care provider.  Stitches will need to be removed in 7 to 10 days.  This can be done by this emergency department, urgent care or primary care provider.  Please keep the wounds clean and dry.  Change the bandage if it becomes wet.  Wash the area with soap and water daily.  Apply a Band-Aid and keep covered until the stitches are removed.

## 2023-04-02 NOTE — ED Triage Notes (Signed)
Pt to ED c/o lacerations to left hand. Left index, left middle, and left ring finger. Pt cut hand on electrical trimmer. Bleeding controlled in triage. Unknown last tetanus shot.

## 2023-04-11 ENCOUNTER — Ambulatory Visit
Admission: RE | Admit: 2023-04-11 | Discharge: 2023-04-11 | Disposition: A | Discharge status: Home / Self Care | Payer: BC Managed Care – PPO | Source: Ambulatory Visit | Attending: Internal Medicine | Admitting: Internal Medicine

## 2023-04-11 VITALS — BP 129/83 | HR 79 | Temp 97.7°F | Resp 18

## 2023-04-11 DIAGNOSIS — S61219D Laceration without foreign body of unspecified finger without damage to nail, subsequent encounter: Secondary | ICD-10-CM

## 2023-04-11 DIAGNOSIS — S61412D Laceration without foreign body of left hand, subsequent encounter: Secondary | ICD-10-CM | POA: Diagnosis not present

## 2023-04-11 DIAGNOSIS — Z4802 Encounter for removal of sutures: Secondary | ICD-10-CM | POA: Diagnosis not present

## 2023-04-11 NOTE — Discharge Instructions (Signed)
We were able to remove the sutures.  Keep these areas clean with soap and water as we discussed.  If anything changes or worsens and you have redness, drainage, numbness or tingling in your hand please return for reevaluation.

## 2023-04-11 NOTE — ED Provider Notes (Signed)
Carol Jimenez    CSN: [id number] Arrival date & time: 04/11/23  1654      History   Chief Complaint Chief Complaint  Patient presents with   Suture / Staple Removal    Only 4 sutures in two fingers - Entered by patient    HPI Carol Jimenez is a 62 y.o. female.   Patient presents today for evaluation of laceration that occurred on 04/02/2023 and suture removal.  An electric trimmer caught several of her fingers on her left hand and she required sutures in her left index and middle fingers.  She is right-handed.  She denies any numbness or paresthesias in the hand.  She feels that this is healed appropriately and she is requesting sutures to be removed.  She denies any bleeding, drainage, fever, nausea, vomiting.    Past Medical History:  Diagnosis Date   Allergy    Hx: UTI (urinary tract infection)    Hypothyroidism     Patient Active Problem List   Diagnosis Date Noted   Encounter for screening for coronary artery disease 03/14/2023   Leg swelling 11/07/2022   Swelling of right lower extremity 01/18/2022   Lumbosacral radiculopathy at L5 12/19/2021   Pre-op evaluation 09/17/2021   Vaginal dryness 05/05/2020   Left ankle pain 02/04/2020   Acid reflux 11/19/2019   Change in hearing 11/19/2019   Chest pain 11/08/2019   Knee pain, bilateral 07/03/2019   Sinusitis 04/28/2019   Congestion of nasal sinus 04/22/2019   Toe pain, left 01/22/2018   Hyperglycemia 06/13/2016   Right knee pain 08/17/2015   Stress 03/23/2015   Health care maintenance 12/24/2014   History of non anemic vitamin B12 deficiency 12/23/2014   Vitamin D deficiency 12/23/2014   Rib pain on right side 12/23/2014   Pseudoangiomatous stromal hyperplasia of breast 08/12/2014   BMI 33.0-33.9,adult 04/13/2014   Abnormal liver function tests 04/13/2014   Hypothyroid 11/12/2013   Menopausal symptoms 11/12/2013   Side pain 11/12/2013   Fatigue 11/12/2013   Daytime somnolence 11/12/2013    Environmental allergies 11/12/2013   Hypercholesterolemia 11/12/2013    Past Surgical History:  Procedure Laterality Date   BREAST BIOPSY Left 2015   negative, stereotactic biopsy   CESAREAN SECTION     ENDOMETRIAL ABLATION  2000   KNEE SURGERY Left     OB History     Gravida  2   Para  2   Term      Preterm      AB      Living  2      SAB      IAB      Ectopic      Multiple      Live Births           Obstetric Comments  1st Menstrual Cycle:  11  1st Pregnancy:  27          Home Medications    Prior to Admission medications   Medication Sig Start Date End Date Taking? Authorizing Provider  ARMOUR THYROID 90 MG tablet TAKE 1 TABLET(90 MG) BY MOUTH DAILY 03/09/23   Dale [place], MD  estradiol (ESTRACE) 0.1 MG/GM vaginal cream INSERT 1 APPLICATORFUL VAGINALLY 2 TIMES A WEEK 01/14/22   Dale [place], MD  estradiol (ESTRACE) 0.5 MG tablet Take 1-2 tablets (0.5-1 mg total) by mouth daily. 03/09/23   Dale [place], MD  fluticasone (FLONASE) 50 MCG/ACT nasal spray Place 2 sprays into both nostrils daily. 06/27/20  Muthersbaugh, Dahlia Client, PA-C  hydrochlorothiazide (HYDRODIURIL) 25 MG tablet TAKE 1 TABLET(25 MG) BY MOUTH DAILY 03/09/23   Dale [place], MD  meloxicam (MOBIC) 7.5 MG tablet Take 15 mg by mouth daily as needed for pain. 01/20/20   [provider]  pantoprazole (PROTONIX) 20 MG tablet Take 1 tablet (20 mg total) by mouth daily. 03/09/23   Dale [place], MD  predniSONE (STERAPRED UNI-PAK 21 TAB) 10 MG (21) TBPK tablet 6 day taper; take as directed on package instructions Patient not taking: Reported on 04/11/2023 03/03/23   Margaretann Loveless, PA-C  progesterone (PROMETRIUM) 100 MG capsule TAKE 1 CAPSULE(100 MG) BY MOUTH DAILY 03/09/23   Dale [place], MD  sertraline (ZOLOFT) 50 MG tablet TAKE 1 TABLET(50 MG) BY MOUTH DAILY 03/09/23   Dale [place], MD  tretinoin (RETIN-A) 0.025 % cream Apply 1 application topically as needed. 11/21/19    [provider]    Family History Family History  Problem Relation Age of Onset   Congestive Heart Failure Mother    Diabetes Mother    COPD Mother    Hypertension Father    Esophageal cancer Father 27       treated at [facility]   Colon polyps Other    Hypertension Sister    Hypertension Sister    Diabetes Sister    Breast cancer Neg Hx     Social History Social History   Tobacco Use   Smoking status: Never   Smokeless tobacco: Never  Vaping Use   Vaping status: Never Used  Substance Use Topics   Alcohol use: Yes    Alcohol/week: 0.0 standard drinks of alcohol    Comment: rare   Drug use: No     Allergies   Sulfa antibiotics   Review of Systems Review of Systems  Constitutional:  Negative for activity change, appetite change, fatigue and fever.  Musculoskeletal:  Negative for arthralgias and myalgias.  Skin:  Positive for wound. Negative for color change.  Neurological:  Negative for weakness and numbness.     Physical Exam Triage Vital Signs ED Triage Vitals [04/11/23 1714]  Encounter Vitals Group     BP 129/83     Systolic BP Percentile      Diastolic BP Percentile      Pulse Rate 79     Resp 18     Temp 97.7 F (36.5 C)     Temp src      SpO2 96 %     Weight      Height      Head Circumference      Peak Flow      Pain Score 2     Pain Loc      Pain Education      Exclude from Growth Chart    No data found.  Updated Vital Signs BP 129/83   Pulse 79   Temp 97.7 F (36.5 C)   Resp 18   LMP 12/26/2012   SpO2 96%   Visual Acuity Right Eye Distance:   Left Eye Distance:   Bilateral Distance:    Right Eye Near:   Left Eye Near:    Bilateral Near:     Physical Exam Vitals reviewed.  Constitutional:      General: She is awake. She is not in acute distress.    Appearance: Normal appearance. She is well-developed. She is not ill-appearing.     Comments: Very pleasant female appears stated age in no acute distress sitting  comfortably  in exam room  HENT:     Head: Normocephalic and atraumatic.  Cardiovascular:     Rate and Rhythm: Normal rate and regular rhythm.     Heart sounds: Normal heart sounds, S1 normal and S2 normal. No murmur heard.    Comments: Capillary refill within 2 seconds left fingers Pulmonary:     Effort: Pulmonary effort is normal.     Breath sounds: Normal breath sounds. No wheezing, rhonchi or rales.     Comments: Clear to auscultation Musculoskeletal:     Comments: Normal active range of motion of left fingers.  Normal pincer and grip strength.  Skin:    Comments: Well-healed laceration noted distal left finger and over middle phalanx of left middle finger with 2 sutures in each wound.  No erythema, bleeding, drainage.  Psychiatric:        Behavior: Behavior is cooperative.      UC Treatments / Results  Labs (all labs ordered are listed, but only abnormal results are displayed) Labs Reviewed - No data to display  EKG   Radiology No results found.  Procedures Procedures (including critical care time)  Medications Ordered in UC Medications - No data to display  Initial Impression / Assessment and Plan / UC Course  I have reviewed the triage vital signs and the nursing notes.  Pertinent labs & imaging results that were available during my care of the patient were reviewed by me and considered in my medical decision making (see chart for details).     Patient is well-appearing, afebrile, nontoxic, nontachycardic.  He does neurovascularly intact.  Wound has healed appropriately and 2 sutures were removed from each laceration for a total of 4 sutures without dehiscence.  No evidence of infection.  Patient was encouraged to keep area clean and return with any signs of infections or concerning symptoms.  All questions were answered to her satisfaction.  Final Clinical Impressions(s) / UC Diagnoses   Final diagnoses:  Laceration of multiple sites of left hand and fingers,  subsequent encounter  Encounter for removal of sutures     Discharge Instructions      We were able to remove the sutures.  Keep these areas clean with soap and water as we discussed.  If anything changes or worsens and you have redness, drainage, numbness or tingling in your hand please return for reevaluation.     ED Prescriptions   None    PDMP not reviewed this encounter.   Jeani Hawking, PA-C 04/11/23 1736

## 2023-04-11 NOTE — ED Triage Notes (Signed)
Patient to Urgent Care for suture removal. Patient cut her fingers on an electrical trimmer 10/5. Sutures placed at [facility].  2 sutures present to L index finger. 2 sutures present to L middle finger.  Areas well healed.

## 2023-05-05 DIAGNOSIS — M5412 Radiculopathy, cervical region: Secondary | ICD-10-CM | POA: Diagnosis not present

## 2023-05-05 DIAGNOSIS — M5416 Radiculopathy, lumbar region: Secondary | ICD-10-CM | POA: Diagnosis not present

## 2023-05-05 DIAGNOSIS — M545 Low back pain, unspecified: Secondary | ICD-10-CM | POA: Diagnosis not present

## 2023-05-05 DIAGNOSIS — M791 Myalgia, unspecified site: Secondary | ICD-10-CM | POA: Diagnosis not present

## 2023-05-24 ENCOUNTER — Other Ambulatory Visit: Payer: Self-pay | Admitting: Internal Medicine

## 2023-07-02 ENCOUNTER — Telehealth: Payer: BC Managed Care – PPO | Admitting: Nurse Practitioner

## 2023-07-02 DIAGNOSIS — B9689 Other specified bacterial agents as the cause of diseases classified elsewhere: Secondary | ICD-10-CM | POA: Diagnosis not present

## 2023-07-02 DIAGNOSIS — J019 Acute sinusitis, unspecified: Secondary | ICD-10-CM | POA: Diagnosis not present

## 2023-07-02 MED ORDER — AMOXICILLIN-POT CLAVULANATE 875-125 MG PO TABS
1.0000 | ORAL_TABLET | Freq: Two times a day (BID) | ORAL | 0 refills | Status: AC
Start: 2023-07-02 — End: 2023-07-09

## 2023-07-02 NOTE — Progress Notes (Signed)
 I have spent 5 minutes in review of e-visit questionnaire, review and updating patient chart, medical decision making and response to patient.   Claiborne Rigg, NP

## 2023-07-02 NOTE — Progress Notes (Signed)

## 2023-07-08 ENCOUNTER — Other Ambulatory Visit (INDEPENDENT_AMBULATORY_CARE_PROVIDER_SITE_OTHER): Payer: BC Managed Care – PPO

## 2023-07-08 DIAGNOSIS — R739 Hyperglycemia, unspecified: Secondary | ICD-10-CM | POA: Diagnosis not present

## 2023-07-08 DIAGNOSIS — E7800 Pure hypercholesterolemia, unspecified: Secondary | ICD-10-CM | POA: Diagnosis not present

## 2023-07-08 LAB — BASIC METABOLIC PANEL
BUN: 17 mg/dL (ref 6–23)
CO2: 30 meq/L (ref 19–32)
Calcium: 9.4 mg/dL (ref 8.4–10.5)
Chloride: 101 meq/L (ref 96–112)
Creatinine, Ser: 0.57 mg/dL (ref 0.40–1.20)
GFR: 96.77 mL/min (ref >60.00)
Glucose, Bld: 124 mg/dL — ABNORMAL HIGH (ref 70–99)
Potassium: 4 meq/L (ref 3.5–5.1)
Sodium: 139 meq/L (ref 135–145)

## 2023-07-08 LAB — CBC WITH DIFFERENTIAL/PLATELET
Basophils Absolute: 0.1 10*3/uL (ref 0.0–0.1)
Basophils Relative: 1 % (ref 0.0–3.0)
Eosinophils Absolute: 0.2 10*3/uL (ref 0.0–0.7)
Eosinophils Relative: 2.2 % (ref 0.0–5.0)
HCT: 46.7 % — ABNORMAL HIGH (ref 36.0–46.0)
Hemoglobin: 15.6 g/dL — ABNORMAL HIGH (ref 12.0–15.0)
Lymphocytes Relative: 22.9 % (ref 12.0–46.0)
Lymphs Abs: 1.6 10*3/uL (ref 0.7–4.0)
MCHC: 33.4 g/dL (ref 30.0–36.0)
MCV: 93.3 fL (ref 78.0–100.0)
Monocytes Absolute: 0.6 10*3/uL (ref 0.1–1.0)
Monocytes Relative: 8.4 % (ref 3.0–12.0)
Neutro Abs: 4.5 10*3/uL (ref 1.4–7.7)
Neutrophils Relative %: 65.5 % (ref 43.0–77.0)
Platelets: 238 10*3/uL (ref 150.0–400.0)
RBC: 5.01 Mil/uL (ref 3.87–5.11)
RDW: 13.1 % (ref 11.5–15.5)
WBC: 6.9 10*3/uL (ref 4.0–10.5)

## 2023-07-08 LAB — LIPID PANEL
Cholesterol: 228 mg/dL — ABNORMAL HIGH (ref 0–200)
HDL: 39.7 mg/dL (ref >39.00)
LDL Cholesterol: 135 mg/dL — ABNORMAL HIGH (ref 0–99)
NonHDL: 188.31
Total CHOL/HDL Ratio: 6
Triglycerides: 267 mg/dL — ABNORMAL HIGH (ref 0.0–149.0)
VLDL: 53.4 mg/dL — ABNORMAL HIGH (ref 0.0–40.0)

## 2023-07-08 LAB — HEPATIC FUNCTION PANEL
ALT: 45 U/L — ABNORMAL HIGH (ref 0–35)
AST: 46 U/L — ABNORMAL HIGH (ref 0–37)
Albumin: 4.3 g/dL (ref 3.5–5.2)
Alkaline Phosphatase: 58 U/L (ref 39–117)
Bilirubin, Direct: 0.1 mg/dL (ref 0.0–0.3)
Total Bilirubin: 0.5 mg/dL (ref 0.2–1.2)
Total Protein: 7.2 g/dL (ref 6.0–8.3)

## 2023-07-08 LAB — HEMOGLOBIN A1C: Hgb A1c MFr Bld: 6.3 % (ref 4.6–6.5)

## 2023-07-12 ENCOUNTER — Encounter: Payer: Self-pay | Admitting: Internal Medicine

## 2023-07-12 ENCOUNTER — Ambulatory Visit: Payer: BLUE CROSS/BLUE SHIELD | Admitting: Internal Medicine

## 2023-07-12 VITALS — BP 122/72 | HR 79 | Temp 98.2°F | Resp 16 | Ht 66.0 in | Wt 203.0 lb

## 2023-07-12 DIAGNOSIS — Z1231 Encounter for screening mammogram for malignant neoplasm of breast: Secondary | ICD-10-CM

## 2023-07-12 DIAGNOSIS — R7989 Other specified abnormal findings of blood chemistry: Secondary | ICD-10-CM

## 2023-07-12 DIAGNOSIS — F439 Reaction to severe stress, unspecified: Secondary | ICD-10-CM | POA: Diagnosis not present

## 2023-07-12 DIAGNOSIS — R739 Hyperglycemia, unspecified: Secondary | ICD-10-CM

## 2023-07-12 DIAGNOSIS — N6489 Other specified disorders of breast: Secondary | ICD-10-CM | POA: Diagnosis not present

## 2023-07-12 DIAGNOSIS — E7800 Pure hypercholesterolemia, unspecified: Secondary | ICD-10-CM | POA: Diagnosis not present

## 2023-07-12 DIAGNOSIS — Z9109 Other allergy status, other than to drugs and biological substances: Secondary | ICD-10-CM

## 2023-07-12 DIAGNOSIS — Z713 Dietary counseling and surveillance: Secondary | ICD-10-CM

## 2023-07-12 DIAGNOSIS — E039 Hypothyroidism, unspecified: Secondary | ICD-10-CM

## 2023-07-12 DIAGNOSIS — Z8639 Personal history of other endocrine, nutritional and metabolic disease: Secondary | ICD-10-CM

## 2023-07-12 MED ORDER — ARMOUR THYROID 90 MG PO TABS: ORAL_TABLET | ORAL | 1 refills | Status: DC

## 2023-07-12 MED ORDER — TIRZEPATIDE-WEIGHT MANAGEMENT 2.5 MG/0.5ML ~~LOC~~ SOLN: 2.5000 mg | SUBCUTANEOUS | 2 refills | Status: DC

## 2023-07-12 MED ORDER — ESTRADIOL 0.1 MG/GM VA CREA: TOPICAL_CREAM | VAGINAL | 1 refills | Status: DC

## 2023-07-12 MED ORDER — HYDROCHLOROTHIAZIDE 25 MG PO TABS: ORAL_TABLET | ORAL | 1 refills | Status: DC

## 2023-07-12 MED ORDER — SERTRALINE HCL 50 MG PO TABS: ORAL_TABLET | ORAL | 1 refills | Status: DC

## 2023-07-12 MED ORDER — ESTRADIOL 0.5 MG PO TABS: 0.5000 mg | ORAL_TABLET | Freq: Every day | ORAL | 1 refills | Status: DC

## 2023-07-12 MED ORDER — PROGESTERONE MICRONIZED 100 MG PO CAPS: ORAL_CAPSULE | ORAL | 1 refills | Status: DC

## 2023-07-12 NOTE — Progress Notes (Signed)
 Subjective:    Patient ID: Carol Jimenez, female    DOB: 1961/01/12, 63 y.o.   MRN: [id number]  Patient here for  Chief Complaint  Patient presents with   Medical Management of Chronic Issues    HPI Here to follow up regarding hypercholesterolemia, GERD, hypothyroidism and increased stress. On zoloft . Evaluated - E visit 07/02/23 - diagnosed with sinus infection. Treated with augmentin . Symptoms resolved. No chest pain or sob reported. No abdominal pain or bowel change reported. Had questions regarding weight loss medication. Discussed GLP 1 agonist. Discussed possible side effects and contraindications of the medication.  Discussed diet and exercise.    Past Medical History:  Diagnosis Date   Allergy    Hx: UTI (urinary tract infection)    Hypothyroidism    Past Surgical History:  Procedure Laterality Date   BREAST BIOPSY Left 2015   negative, stereotactic biopsy   CESAREAN SECTION     ENDOMETRIAL ABLATION  2000   KNEE SURGERY Left    Family History  Problem Relation Age of Onset   Congestive Heart Failure Mother    Diabetes Mother    COPD Mother    Hypertension Father    Esophageal cancer Father 59       treated at [facility]   Colon polyps Other    Hypertension Sister    Hypertension Sister    Diabetes Sister    Breast cancer Neg Hx    Social History   Socioeconomic History   Marital status: Married    Spouse name: Not on file   Number of children: Not on file   Years of education: Not on file   Highest education level: Bachelor's degree (e.g., BA, AB, BS)  Occupational History   Not on file  Tobacco Use   Smoking status: Never   Smokeless tobacco: Never  Vaping Use   Vaping status: Never Used  Substance and Sexual Activity   Alcohol use: Yes    Alcohol/week: 0.0 standard drinks of alcohol    Comment: rare   Drug use: No   Sexual activity: Not on file  Other Topics Concern   Not on file  Social History Narrative   Not on file   Social Drivers of  Health   Financial Resource Strain: Low Risk  (07/08/2023)   Overall Financial Resource Strain (CARDIA)    Difficulty of Paying Living Expenses: Not hard at all  Food Insecurity: No Food Insecurity (07/08/2023)   Hunger Vital Sign    Worried About Running Out of Food in the Last Year: Never true    Ran Out of Food in the Last Year: Never true  Transportation Needs: No Transportation Needs (07/08/2023)   PRAPARE - Administrator, Civil Service (Medical): No    Lack of Transportation (Non-Medical): No  Physical Activity: Insufficiently Active (07/08/2023)   Exercise Vital Sign    Days of Exercise per Week: 1 day    Minutes of Exercise per Session: 30 min  Stress: No Stress Concern Present (07/08/2023)   [employer] of Occupational Health - Occupational Stress Questionnaire    Feeling of Stress : Not at all  Social Connections: Socially Integrated (07/08/2023)   Social Connection and Isolation Panel [NHANES]    Frequency of Communication with Friends and Family: More than three times a week    Frequency of Social Gatherings with Friends and Family: More than three times a week    Attends Religious Services: More than 4 times per year  Active Member of Clubs or Organizations: Yes    Attends Banker Meetings: More than 4 times per year    Marital Status: Married     Review of Systems  Constitutional:  Negative for appetite change and unexpected weight change.  HENT:  Negative for congestion and sinus pressure.   Respiratory:  Negative for cough, chest tightness and shortness of breath.   Cardiovascular:  Negative for chest pain and palpitations.  Gastrointestinal:  Negative for abdominal pain, diarrhea, nausea and vomiting.  Genitourinary:  Negative for difficulty urinating and dysuria.  Musculoskeletal:  Negative for joint swelling and myalgias.  Skin:  Negative for color change and rash.  Neurological:  Negative for dizziness and headaches.   Psychiatric/Behavioral:  Negative for agitation and dysphoric mood.        Objective:     BP 122/72   Pulse 79   Temp 98.2 F (36.8 C)   Resp 16   Ht 5' 6 (1.676 m)   Wt 203 lb (92.1 kg)   LMP 12/26/2012   SpO2 98%   BMI 32.77 kg/m  Wt Readings from Last 3 Encounters:  07/12/23 203 lb (92.1 kg)  04/02/23 200 lb (90.7 kg)  03/09/23 203 lb 12.8 oz (92.4 kg)    Physical Exam Vitals reviewed.  Constitutional:      General: She is not in acute distress.    Appearance: Normal appearance.  HENT:     Head: Normocephalic and atraumatic.     Right Ear: External ear normal.     Left Ear: External ear normal.     Mouth/Throat:     Pharynx: No oropharyngeal exudate or posterior oropharyngeal erythema.  Eyes:     General: No scleral icterus.       Right eye: No discharge.        Left eye: No discharge.     Conjunctiva/sclera: Conjunctivae normal.  Neck:     Thyroid : No thyromegaly.  Cardiovascular:     Rate and Rhythm: Normal rate and regular rhythm.  Pulmonary:     Effort: No respiratory distress.     Breath sounds: Normal breath sounds. No wheezing.  Abdominal:     General: Bowel sounds are normal.     Palpations: Abdomen is soft.     Tenderness: There is no abdominal tenderness.  Musculoskeletal:        General: No swelling or tenderness.     Cervical back: Neck supple. No tenderness.  Lymphadenopathy:     Cervical: No cervical adenopathy.  Skin:    Findings: No erythema or rash.  Neurological:     Mental Status: She is alert.  Psychiatric:        Mood and Affect: Mood normal.        Behavior: Behavior normal.         Outpatient Encounter Medications as of 07/12/2023  Medication Sig   tirzepatide  (ZEPBOUND ) 2.5 MG/0.5ML injection vial Inject 2.5 mg into the skin once a week.   ARMOUR THYROID  90 MG tablet TAKE 1 TABLET(90 MG) BY MOUTH DAILY   estradiol  (ESTRACE ) 0.1 MG/GM vaginal cream INSERT 1 APPLICATORFUL VAGINALLY 2 TIMES A WEEK   estradiol   (ESTRACE ) 0.5 MG tablet Take 1-2 tablets (0.5-1 mg total) by mouth daily.   fluticasone  (FLONASE ) 50 MCG/ACT nasal spray Place 2 sprays into both nostrils daily.   hydrochlorothiazide  (HYDRODIURIL ) 25 MG tablet TAKE 1 TABLET(25 MG) BY MOUTH DAILY   meloxicam  (MOBIC ) 7.5 MG tablet Take 15 mg by mouth  daily as needed for pain.   pantoprazole  (PROTONIX ) 20 MG tablet Take 1 tablet (20 mg total) by mouth daily.   progesterone  (PROMETRIUM ) 100 MG capsule TAKE 1 CAPSULE(100 MG) BY MOUTH DAILY   sertraline  (ZOLOFT ) 50 MG tablet TAKE 1 TABLET(50 MG) BY MOUTH DAILY   tretinoin (RETIN-A) 0.025 % cream Apply 1 application topically as needed.   [DISCONTINUED] ARMOUR THYROID  90 MG tablet TAKE 1 TABLET(90 MG) BY MOUTH DAILY   [DISCONTINUED] estradiol  (ESTRACE ) 0.1 MG/GM vaginal cream INSERT 1 APPLICATORFUL VAGINALLY 2 TIMES A WEEK   [DISCONTINUED] estradiol  (ESTRACE ) 0.5 MG tablet Take 1-2 tablets (0.5-1 mg total) by mouth daily.   [DISCONTINUED] hydrochlorothiazide  (HYDRODIURIL ) 25 MG tablet TAKE 1 TABLET(25 MG) BY MOUTH DAILY   [DISCONTINUED] predniSONE  (STERAPRED UNI-PAK 21 TAB) 10 MG (21) TBPK tablet 6 day taper; take as directed on package instructions (Patient not taking: Reported on 04/11/2023)   [DISCONTINUED] progesterone  (PROMETRIUM ) 100 MG capsule TAKE 1 CAPSULE(100 MG) BY MOUTH DAILY   [DISCONTINUED] sertraline  (ZOLOFT ) 50 MG tablet TAKE 1 TABLET(50 MG) BY MOUTH DAILY   No facility-administered encounter medications on file as of 07/12/2023.     Lab Results  Component Value Date   WBC 6.9 07/08/2023   HGB 15.6 (H) 07/08/2023   HCT 46.7 (H) 07/08/2023   PLT 238.0 07/08/2023   GLUCOSE 124 (H) 07/08/2023   CHOL 228 (H) 07/08/2023   TRIG 267.0 (H) 07/08/2023   HDL 39.70 07/08/2023   LDLDIRECT 145.0 11/01/2022   LDLCALC 135 (H) 07/08/2023   ALT 45 (H) 07/08/2023   AST 46 (H) 07/08/2023   NA 139 07/08/2023   K 4.0 07/08/2023   CL 101 07/08/2023   CREATININE 0.57 07/08/2023   BUN 17  07/08/2023   CO2 30 07/08/2023   TSH 2.23 03/07/2023   HGBA1C 6.3 07/08/2023       Assessment & Plan:  Hyperglycemia Assessment & Plan: Low carb diet and exercise.  Follow met b and a1c.  Lab Results  Component Value Date   HGBA1C 6.3 07/08/2023    Orders: -     Hemoglobin A1c; Future  Hypercholesterolemia Assessment & Plan: The 10-year ASCVD risk score (Arnett DK, et al., 2019) is: 7.1%   Values used to calculate the score:     Age: 10 years     Sex: Female     Is Non-Hispanic African American: No     Diabetic: No     Tobacco smoker: No     Systolic Blood Pressure: 122 mmHg     Is BP treated: Yes     HDL Cholesterol: 39.7 mg/dL     Total Cholesterol: 228 mg/dL  Low cholesterol diet and exercise.  Follow lipid panel.   Orders: -     Lipid panel; Future -     Hepatic function panel; Future -     Basic metabolic panel; Future -     TSH; Future  Visit for screening mammogram -     3D Screening Mammogram, Left and Right; Future  Abnormal liver function tests Assessment & Plan: Recent liver enzymes - slightly elevated. Diet and exercise. Follow liver panel.    Stress Assessment & Plan: Overall appears to be doing well. On zoloft . Follow. Stable.    Pseudoangiomatous stromal hyperplasia of breast Assessment & Plan: Previously saw Dr Dessa.  Mammogram 10/07/22- [facility].    Hypothyroidism, unspecified type Assessment & Plan: On thyroid  replacement.  Follow tsh.    History of non anemic vitamin B12  deficiency Assessment & Plan: Follow cbc.     Environmental allergies Assessment & Plan: Taking singulair.  Doing well.     Weight loss counseling, encounter for Assessment & Plan: Discussed diet and exercise. Discussed GLP 1 agonist. Discussed possible side effects of medication and discussed contraindications of the medication. Desires to start zepbound . Rx sent in for 2.5mg  zepbound  to take weekly. Follow for possible side effects.    Other  orders -     Armour Thyroid ; TAKE 1 TABLET(90 MG) BY MOUTH DAILY  Dispense: 90 tablet; Refill: 1 -     Estradiol ; INSERT 1 APPLICATORFUL VAGINALLY 2 TIMES A WEEK  Dispense: 42.5 g; Refill: 1 -     Estradiol ; Take 1-2 tablets (0.5-1 mg total) by mouth daily.  Dispense: 180 tablet; Refill: 1 -     hydroCHLOROthiazide ; TAKE 1 TABLET(25 MG) BY MOUTH DAILY  Dispense: 90 tablet; Refill: 1 -     Progesterone ; TAKE 1 CAPSULE(100 MG) BY MOUTH DAILY  Dispense: 90 capsule; Refill: 1 -     Sertraline  HCl; TAKE 1 TABLET(50 MG) BY MOUTH DAILY  Dispense: 90 tablet; Refill: 1 -     Tirzepatide -Weight Management; Inject 2.5 mg into the skin once a week.  Dispense: 2 mL; Refill: 2     Allena Hamilton, MD

## 2023-07-12 NOTE — Assessment & Plan Note (Signed)
 The 10-year ASCVD risk score (Arnett DK, et al., 2019) is: 7.1%   Values used to calculate the score:     Age: 63 years     Sex: Female     Is Non-Hispanic African American: No     Diabetic: No     Tobacco smoker: No     Systolic Blood Pressure: 122 mmHg     Is BP treated: Yes     HDL Cholesterol: 39.7 mg/dL     Total Cholesterol: 228 mg/dL  Low cholesterol diet and exercise.  Follow lipid panel.

## 2023-07-17 ENCOUNTER — Telehealth: Payer: Self-pay | Admitting: Internal Medicine

## 2023-07-17 ENCOUNTER — Encounter: Payer: Self-pay | Admitting: Internal Medicine

## 2023-07-17 DIAGNOSIS — Z713 Dietary counseling and surveillance: Secondary | ICD-10-CM | POA: Insufficient documentation

## 2023-07-17 DIAGNOSIS — R7989 Other specified abnormal findings of blood chemistry: Secondary | ICD-10-CM

## 2023-07-17 NOTE — Assessment & Plan Note (Signed)
Low carb diet and exercise.  Follow met b and a1c.  Lab Results  Component Value Date   HGBA1C 6.3 07/08/2023

## 2023-07-17 NOTE — Assessment & Plan Note (Signed)
Recent liver enzymes - slightly elevated. Diet and exercise. Follow liver panel.

## 2023-07-17 NOTE — Assessment & Plan Note (Signed)
Overall appears to be doing well. On zoloft. Follow. Stable.

## 2023-07-17 NOTE — Assessment & Plan Note (Signed)
Follow cbc.  

## 2023-07-17 NOTE — Telephone Encounter (Signed)
Carol Jimenez - please see me about this. Please notify her that she needs a f/u liver panel checked within the next couple of weeks. Please let her know - non fasting lab appt. Liver panel only. Needs to be scheduled.  Also, need to discuss with you regarding pulmonary referral.

## 2023-07-17 NOTE — Assessment & Plan Note (Signed)
Taking singulair.  Doing well.

## 2023-07-17 NOTE — Assessment & Plan Note (Signed)
Previously saw Dr Lemar Livings.  Mammogram 10/07/22- Birads I.

## 2023-07-17 NOTE — Assessment & Plan Note (Signed)
Discussed diet and exercise. Discussed GLP 1 agonist. Discussed possible side effects of medication and discussed contraindications of the medication. Desires to start zepbound. Rx sent in for 2.5mg  zepbound to take weekly. Follow for possible side effects.

## 2023-07-17 NOTE — Assessment & Plan Note (Signed)
On thyroid replacement.  Follow tsh.  

## 2023-07-18 ENCOUNTER — Telehealth: Payer: Self-pay

## 2023-07-18 ENCOUNTER — Encounter: Payer: Self-pay | Admitting: Internal Medicine

## 2023-07-18 DIAGNOSIS — R29818 Other symptoms and signs involving the nervous system: Secondary | ICD-10-CM

## 2023-07-18 DIAGNOSIS — Z6832 Body mass index (BMI) 32.0-32.9, adult: Secondary | ICD-10-CM

## 2023-07-18 NOTE — Telephone Encounter (Signed)
LMTCB

## 2023-07-18 NOTE — Telephone Encounter (Signed)
Copied from CRM [id number]. Topic: Clinical - Lab/Test Results >> Jul 18, 2023  3:35 PM Sonny Dandy B wrote: Reason for CRM: pt called to speak with provider regarding call that was placed to her regarding Pulmonary referral. Please call pt back at [phone]

## 2023-07-19 MED ORDER — TIRZEPATIDE-WEIGHT MANAGEMENT 2.5 MG/0.5ML ~~LOC~~ SOLN: 2.5000 mg | SUBCUTANEOUS | 2 refills | Status: DC

## 2023-07-20 NOTE — Telephone Encounter (Signed)
See my chart. Pulm referral placed.

## 2023-07-20 NOTE — Telephone Encounter (Signed)
Pt scheduled, pulmonary referral placed.

## 2023-07-20 NOTE — Telephone Encounter (Signed)
 See other note

## 2023-07-26 ENCOUNTER — Ambulatory Visit: Payer: BLUE CROSS/BLUE SHIELD

## 2023-07-26 ENCOUNTER — Other Ambulatory Visit (INDEPENDENT_AMBULATORY_CARE_PROVIDER_SITE_OTHER): Payer: BLUE CROSS/BLUE SHIELD

## 2023-07-26 DIAGNOSIS — R7989 Other specified abnormal findings of blood chemistry: Secondary | ICD-10-CM

## 2023-07-26 LAB — HEPATIC FUNCTION PANEL
ALT: 31 U/L (ref 0–35)
AST: 37 U/L (ref 0–37)
Albumin: 4.3 g/dL (ref 3.5–5.2)
Alkaline Phosphatase: 57 U/L (ref 39–117)
Bilirubin, Direct: 0.1 mg/dL (ref 0.0–0.3)
Total Bilirubin: 0.6 mg/dL (ref 0.2–1.2)
Total Protein: 7.1 g/dL (ref 6.0–8.3)

## 2023-07-26 NOTE — Progress Notes (Signed)
Pt came into the office for teaching of Zepbound. Pt brought in her own vial and needles and we  proceeded to watch video and showed her how to draw up her medication  and where she can administer in her abdomen area not near her belly button or in the meaty part of her thigh and administer on her own at home, and how to dispose of needles at home. Pt verbalized understanding. Pt administered her first dose age of Zepbound herself in office witnessed by myself and Morrie Sheldon Banker)

## 2023-07-27 ENCOUNTER — Encounter: Payer: Self-pay | Admitting: Internal Medicine

## 2023-08-09 ENCOUNTER — Ambulatory Visit: Payer: BC Managed Care – PPO | Admitting: Sleep Medicine

## 2023-08-09 ENCOUNTER — Encounter: Payer: Self-pay | Admitting: Sleep Medicine

## 2023-08-09 VITALS — BP 122/84 | HR 64 | Temp 96.9°F | Ht 66.0 in | Wt 200.4 lb

## 2023-08-09 DIAGNOSIS — E039 Hypothyroidism, unspecified: Secondary | ICD-10-CM | POA: Diagnosis not present

## 2023-08-09 DIAGNOSIS — R0683 Snoring: Secondary | ICD-10-CM

## 2023-08-09 DIAGNOSIS — G4733 Obstructive sleep apnea (adult) (pediatric): Secondary | ICD-10-CM

## 2023-08-09 NOTE — Patient Instructions (Signed)
Will complete a home sleep study and follow up to review results.

## 2023-08-09 NOTE — Progress Notes (Signed)
Name:Carol Jimenez MRN: [id number] DOB: [birth year]   CHIEF COMPLAINT:  EXCESSIVE DAYTIME SLEEPINESS   HISTORY OF PRESENT ILLNESS:  Carol Jimenez is a 63 y.o. w/ a h/o GERD, hypothyroidism and obesity who presents for c/o loud snoring, witnessed apnea and excessive daytime sleepiness which has been present for several years. Reports nocturnal awakenings due to nocturia, however does not have difficulty falling back to sleep. Denies any significant weight changes. Denies morning headaches, night sweats or dry mouth. Denies RLS symptoms or dream enactment. Reports a family history of sleep apnea. Denies drowsy driving. Drinks 1 cup of coffee daily, occasional alcohol use, denies tobacco or illicit drug use.   Bedtime 11 pm Sleep onset 5 mins Rise time 7 am   EPWORTH SLEEP SCORE 14     No data to display          PAST MEDICAL HISTORY :   has a past medical history of Allergy, UTI (urinary tract infection), and Hypothyroidism.  has a past surgical history that includes Cesarean section; Endometrial ablation (2000); Breast biopsy (Left, 2015); and Knee surgery (Left). Prior to Admission medications   Medication Sig Start Date End Date Taking? Authorizing Provider  ARMOUR THYROID 90 MG tablet TAKE 1 TABLET(90 MG) BY MOUTH DAILY 07/12/23   Dale [place], MD  estradiol (ESTRACE) 0.1 MG/GM vaginal cream INSERT 1 APPLICATORFUL VAGINALLY 2 TIMES A WEEK 07/12/23   Dale [place], MD  estradiol (ESTRACE) 0.5 MG tablet Take 1-2 tablets (0.5-1 mg total) by mouth daily. 07/12/23   Dale [place], MD  fluticasone (FLONASE) 50 MCG/ACT nasal spray Place 2 sprays into both nostrils daily. 06/27/20   Muthersbaugh, Dahlia Client, PA-C  hydrochlorothiazide (HYDRODIURIL) 25 MG tablet TAKE 1 TABLET(25 MG) BY MOUTH DAILY 07/12/23   Dale [place], MD  meloxicam (MOBIC) 7.5 MG tablet Take 15 mg by mouth daily as needed for pain. 01/20/20   [provider]  pantoprazole (PROTONIX) 20 MG tablet Take  1 tablet (20 mg total) by mouth daily. 03/09/23   Dale [place], MD  progesterone (PROMETRIUM) 100 MG capsule TAKE 1 CAPSULE(100 MG) BY MOUTH DAILY 07/12/23   Dale [place], MD  sertraline (ZOLOFT) 50 MG tablet TAKE 1 TABLET(50 MG) BY MOUTH DAILY 07/12/23   Dale [place], MD  tirzepatide [facility]) 2.5 MG/0.5ML injection vial Inject 2.5 mg into the skin once a week. 07/19/23   Dale Girard, MD  tretinoin (RETIN-A) 0.025 % cream Apply 1 application topically as needed. 11/21/19   [provider]   Allergies  Allergen Reactions   Sulfa Antibiotics Rash    Topical cream with sulfa antibiotics caused rash    FAMILY HISTORY:  family history includes COPD in her mother; Colon polyps in an other family member; Congestive Heart Failure in her mother; Diabetes in her mother and sister; Esophageal cancer (age of onset: 56) in her father; Hypertension in her father, sister, and sister. SOCIAL HISTORY:  reports that she has never smoked. She has never used smokeless tobacco. She reports current alcohol use. She reports that she does not use drugs.   Review of Systems:  Gen:  Denies  fever, sweats, chills weight loss  HEENT: Denies blurred vision, double vision, ear pain, eye pain, hearing loss, nose bleeds, sore throat Cardiac:  No dizziness, chest pain or heaviness, chest tightness,edema, No JVD Resp:   No cough, -sputum production, -shortness of breath,-wheezing, -hemoptysis,  Gi: Denies swallowing difficulty, stomach pain, nausea or vomiting, diarrhea, constipation, bowel incontinence Gu:  Denies  bladder incontinence, burning urine Ext:   Denies Joint pain, stiffness or swelling Skin: Denies  skin rash, easy bruising or bleeding or hives Endoc:  Denies polyuria, polydipsia , polyphagia or weight change Psych:   Denies depression, insomnia or hallucinations  Other:  All other systems negative  VITAL SIGNS: BP 122/84 (BP Location: Right Arm, Cuff Size: Normal)   Pulse 64   Temp  (!) 96.9 F (36.1 C)   Ht 5\' 6"  (1.676 m)   Wt 200 lb 6.4 oz (90.9 kg)   LMP 12/26/2012   SpO2 98%   BMI 32.35 kg/m     Physical Examination:   General Appearance: No distress  EYES PERRLA, EOM intact.   NECK Supple, No JVD Throat Mallampati III Pulmonary: normal breath sounds, No wheezing.  CardiovascularNormal S1,S2.  No m/r/g.   Abdomen: Benign, Soft, non-tender. Skin:   warm, no rashes, no ecchymosis  Extremities: normal, no cyanosis, clubbing. Neuro:without focal findings,  speech normal  PSYCHIATRIC: Mood, affect within normal limits.   ASSESSMENT AND PLAN  OSA I suspect that OSA is likely present due to clinical presentation. Discussed the consequences of untreated sleep apnea. Advised not to drive drowsy for safety of patient and others. Will complete further evaluation with a home sleep study and follow up to review results.    Hypothyroidism Stable, on current management.    MEDICATION ADJUSTMENTS/LABS AND TESTS ORDERED: Recommend Sleep Study   Patient  satisfied with Plan of action and management. All questions answered  Follow up to review HST results and treatment plan.   I spent a total of 35 minutes reviewing chart data, face-to-face evaluation with the patient, counseling and coordination of care as detailed above.    Tempie Hoist, M.D.  Sleep Medicine [facility]

## 2023-08-29 ENCOUNTER — Encounter: Payer: Self-pay | Admitting: Internal Medicine

## 2023-08-29 NOTE — Telephone Encounter (Signed)
 Ok to increase zepbound to 5mg  q week.

## 2023-08-29 NOTE — Telephone Encounter (Signed)
 Ok to increase?

## 2023-08-30 MED ORDER — TIRZEPATIDE-WEIGHT MANAGEMENT 5 MG/0.5ML ~~LOC~~ SOLN: 5.0000 mg | SUBCUTANEOUS | 2 refills | Status: DC

## 2023-08-31 ENCOUNTER — Ambulatory Visit

## 2023-08-31 DIAGNOSIS — G4733 Obstructive sleep apnea (adult) (pediatric): Secondary | ICD-10-CM

## 2023-08-31 DIAGNOSIS — R069 Unspecified abnormalities of breathing: Secondary | ICD-10-CM | POA: Diagnosis not present

## 2023-09-01 ENCOUNTER — Telehealth: Payer: Self-pay | Admitting: Sleep Medicine

## 2023-09-01 DIAGNOSIS — G4733 Obstructive sleep apnea (adult) (pediatric): Secondary | ICD-10-CM

## 2023-09-01 NOTE — Telephone Encounter (Signed)
-----   Message from [facility] D REDDY sent at 09/01/2023 10:11 AM EST ----- Regarding: HST results Please notify patient that HST revealed severe OSA, recommend starting on APAP therapy at 4-16 cm H2O, EPR 3 with the Airfit N30i nasal mask. Please also schedule 3 month CPAP f/u visit at this time as well. Thanks ----- Message ----- From: Lilian Kapur Sent: 08/31/2023   5:14 PM EST To: Alanda Slim, MD

## 2023-09-01 NOTE — Telephone Encounter (Signed)
 Patient advised of sleep study results. DME order placed. Patient requested to keep appt for 09/14/2023. Nothing further needed.

## 2023-09-01 NOTE — Telephone Encounter (Signed)
 Patient is returning missed call.

## 2023-09-10 ENCOUNTER — Other Ambulatory Visit: Payer: Self-pay

## 2023-09-10 ENCOUNTER — Encounter: Payer: Self-pay | Admitting: Emergency Medicine

## 2023-09-10 ENCOUNTER — Ambulatory Visit
Admission: EM | Admit: 2023-09-10 | Discharge: 2023-09-10 | Disposition: A | Discharge status: Home / Self Care | Attending: Emergency Medicine | Admitting: Emergency Medicine

## 2023-09-10 DIAGNOSIS — J020 Streptococcal pharyngitis: Secondary | ICD-10-CM

## 2023-09-10 LAB — POCT RAPID STREP A (OFFICE): Rapid Strep A Screen: POSITIVE — AB

## 2023-09-10 MED ORDER — AMOXICILLIN 500 MG PO CAPS: 500.0000 mg | ORAL_CAPSULE | Freq: Two times a day (BID) | ORAL | 0 refills | Status: AC

## 2023-09-10 NOTE — ED Triage Notes (Signed)
 Patient presents to [facility] for evaluation of 1 week of sinus congestion, cough, and sore throat.  Has been taking Claritin, Mucinex, Sudafed, and Tyelnol.  Patient states she has had strep before, and is concerned for that and would like testing

## 2023-09-10 NOTE — Discharge Instructions (Signed)
Your rapid strep test today was positive  Take amoxicillin twice a day for the next 10 days, daily will see improvement in about 48 hours and steady progression from there  May use of salt gargles throat lozenges, warm liquids, teaspoons of honey and over-the-counter clippers septic spray for comfort  May give Tylenol or Motrin every 6 hours as needed for additional comfort  You may follow-up at urgent care as needed

## 2023-09-10 NOTE — ED Provider Notes (Signed)
 Carol Jimenez    CSN: [id number] Arrival date & time: 09/10/23  6578      History   Chief Complaint Chief Complaint  Patient presents with   Sore Throat    HPI Carol Jimenez is a 63 y.o. female.   Patient presents for evaluation of nasal congestion, rhinorrhea, left-sided ear fullness, sore throat, chest congestion and a productive cough present for 7 days.  Associated nausea without vomiting, able to tolerate food and liquids.  No known sick contacts prior.  Has attempted use of Claritin, Mucinex, Sudafed and Tylenol.  Past Medical History:  Diagnosis Date   Allergy    Hx: UTI (urinary tract infection)    Hypothyroidism     Patient Active Problem List   Diagnosis Date Noted   Weight loss counseling, encounter for 07/17/2023   Encounter for screening for coronary artery disease 03/14/2023   Leg swelling 11/07/2022   Swelling of right lower extremity 01/18/2022   Lumbosacral radiculopathy at L5 12/19/2021   Pre-op evaluation 09/17/2021   Vaginal dryness 05/05/2020   Left ankle pain 02/04/2020   Acid reflux 11/19/2019   Change in hearing 11/19/2019   Chest pain 11/08/2019   Knee pain, bilateral 07/03/2019   Sinusitis 04/28/2019   Congestion of nasal sinus 04/22/2019   Toe pain, left 01/22/2018   Hyperglycemia 06/13/2016   Right knee pain 08/17/2015   Stress 03/23/2015   Health care maintenance 12/24/2014   History of non anemic vitamin B12 deficiency 12/23/2014   Vitamin D deficiency 12/23/2014   Rib pain on right side 12/23/2014   Pseudoangiomatous stromal hyperplasia of breast 08/12/2014   BMI 33.0-33.9,adult 04/13/2014   Abnormal liver function tests 04/13/2014   Hypothyroid 11/12/2013   Menopausal symptoms 11/12/2013   Side pain 11/12/2013   Fatigue 11/12/2013   Daytime somnolence 11/12/2013   Environmental allergies 11/12/2013   Hypercholesterolemia 11/12/2013    Past Surgical History:  Procedure Laterality Date   BREAST BIOPSY Left  2015   negative, stereotactic biopsy   CESAREAN SECTION     ENDOMETRIAL ABLATION  2000   KNEE SURGERY Left     OB History     Gravida  2   Para  2   Term      Preterm      AB      Living  2      SAB      IAB      Ectopic      Multiple      Live Births           Obstetric Comments  1st Menstrual Cycle:  11  1st Pregnancy:  27          Home Medications    Prior to Admission medications   Medication Sig Start Date End Date Taking? Authorizing Provider  amoxicillin (AMOXIL) 500 MG capsule Take 1 capsule (500 mg total) by mouth 2 (two) times daily for 10 days. 09/10/23 09/20/23 Yes Yalissa Fink, Elita Boone, NP  ARMOUR THYROID 90 MG tablet TAKE 1 TABLET(90 MG) BY MOUTH DAILY 07/12/23   Dale [place], MD  estradiol (ESTRACE) 0.1 MG/GM vaginal cream INSERT 1 APPLICATORFUL VAGINALLY 2 TIMES A WEEK 07/12/23   Dale [place], MD  estradiol (ESTRACE) 0.5 MG tablet Take 1-2 tablets (0.5-1 mg total) by mouth daily. 07/12/23   Dale [place], MD  fluticasone (FLONASE) 50 MCG/ACT nasal spray Place 2 sprays into both nostrils daily. 06/27/20   Muthersbaugh, Dahlia Client, PA-C  hydrochlorothiazide (HYDRODIURIL) 25 MG  tablet TAKE 1 TABLET(25 MG) BY MOUTH DAILY 07/12/23   Dale [place], MD  meloxicam (MOBIC) 7.5 MG tablet Take 15 mg by mouth daily as needed for pain. 01/20/20   [provider]  pantoprazole (PROTONIX) 20 MG tablet Take 1 tablet (20 mg total) by mouth daily. 03/09/23   Dale [place], MD  progesterone (PROMETRIUM) 100 MG capsule TAKE 1 CAPSULE(100 MG) BY MOUTH DAILY 07/12/23   Dale [place], MD  sertraline (ZOLOFT) 50 MG tablet TAKE 1 TABLET(50 MG) BY MOUTH DAILY 07/12/23   Dale [place], MD  tirzepatide [facility]) 2.5 MG/0.5ML injection vial Inject 2.5 mg into the skin once a week. 07/19/23   Dale [place], MD  tirzepatide 5 MG/0.5ML injection vial Inject 5 mg into the skin once a week. 08/30/23   Dale [place], MD  tretinoin (RETIN-A) 0.025 % cream Apply 1  application topically as needed. 11/21/19   [provider]    Family History Family History  Problem Relation Age of Onset   Congestive Heart Failure Mother    Diabetes Mother    COPD Mother    Hypertension Father    Esophageal cancer Father 1       treated at [facility]   Colon polyps Other    Hypertension Sister    Hypertension Sister    Diabetes Sister    Breast cancer Neg Hx     Social History Social History   Tobacco Use   Smoking status: Never   Smokeless tobacco: Never  Vaping Use   Vaping status: Never Used  Substance Use Topics   Alcohol use: Yes    Alcohol/week: 0.0 standard drinks of alcohol    Comment: rare   Drug use: No     Allergies   Sulfa antibiotics   Review of Systems Review of Systems   Physical Exam Triage Vital Signs ED Triage Vitals  Encounter Vitals Group     BP 09/10/23 0841 134/87     Systolic BP Percentile --      Diastolic BP Percentile --      Pulse Rate 09/10/23 0841 80     Resp 09/10/23 0841 18     Temp 09/10/23 0841 (!) 97.2 F (36.2 C)     Temp Source 09/10/23 0841 Temporal     SpO2 09/10/23 0841 95 %     Weight --      Height --      Head Circumference --      Peak Flow --      Pain Score 09/10/23 0842 9     Pain Loc --      Pain Education --      Exclude from Growth Chart --    No data found.  Updated Vital Signs BP 134/87 (BP Location: Left Arm)   Pulse 80   Temp (!) 97.2 F (36.2 C) (Temporal)   Resp 18   LMP 12/26/2012   SpO2 95%   Visual Acuity Right Eye Distance:   Left Eye Distance:   Bilateral Distance:    Right Eye Near:   Left Eye Near:    Bilateral Near:     Physical Exam Constitutional:      Appearance: Normal appearance.  HENT:     Head: Normocephalic.     Right Ear: Tympanic membrane, ear canal and external ear normal.     Left Ear: Tympanic membrane, ear canal and external ear normal.     Nose: Congestion present. No rhinorrhea.     Mouth/Throat:  Pharynx: Posterior  oropharyngeal erythema present. No oropharyngeal exudate.  Eyes:     Extraocular Movements: Extraocular movements intact.  Cardiovascular:     Rate and Rhythm: Normal rate and regular rhythm.     Pulses: Normal pulses.     Heart sounds: Normal heart sounds.  Pulmonary:     Effort: Pulmonary effort is normal.     Breath sounds: Normal breath sounds.  Musculoskeletal:     Cervical back: Normal range of motion and neck supple.  Neurological:     Mental Status: She is alert and oriented to person, place, and time. Mental status is at baseline.      UC Treatments / Results  Labs (all labs ordered are listed, but only abnormal results are displayed) Labs Reviewed  POCT RAPID STREP A (OFFICE) - Abnormal; Notable for the following components:      Result Value   Rapid Strep A Screen Positive (*)    All other components within normal limits    EKG   Radiology No results found.  Procedures Procedures (including critical care time)  Medications Ordered in UC Medications - No data to display  Initial Impression / Assessment and Plan / UC Course  I have reviewed the triage vital signs and the nursing notes.  Pertinent labs & imaging results that were available during my care of the patient were reviewed by me and considered in my medical decision making (see chart for details).  Strep pharyngitis  Rapid testing positive, discussed with patient, amoxicillin 10-day course prescribed, may attempt salt water gargles, throat lozenges, warm to cool liquids per preference, over-the-counter Chloraseptic spray and over-the-counter analgesics for management of discomfort, may follow-up with urgent care as needed if symptoms persist or worsen, note given  Final Clinical Impressions(s) / UC Diagnoses   Final diagnoses:  Strep pharyngitis     Discharge Instructions      Your rapid strep test today was positive  Take amoxicillin twice a day for the next 10 days, daily will see  improvement in about 48 hours and steady progression from there  May use of salt gargles throat lozenges, warm liquids, teaspoons of honey and over-the-counter clippers septic spray for comfort  May give Tylenol or Motrin every 6 hours as needed for additional comfort  You may follow-up at urgent care as needed      ED Prescriptions     Medication Sig Dispense Auth. Provider   amoxicillin (AMOXIL) 500 MG capsule Take 1 capsule (500 mg total) by mouth 2 (two) times daily for 10 days. 20 capsule Valinda Hoar, NP      PDMP not reviewed this encounter.   Valinda Hoar, [place] 09/10/23 [phone]

## 2023-09-12 ENCOUNTER — Encounter: Payer: Self-pay | Admitting: Internal Medicine

## 2023-09-12 MED ORDER — SCOPOLAMINE 1 MG/3DAYS TD PT72: 1.0000 | MEDICATED_PATCH | TRANSDERMAL | 0 refills | Status: DC

## 2023-09-12 NOTE — Telephone Encounter (Signed)
 I have sent in the prescription for scopolamine patches. Regarding zepbount, is there anything different we need to do given her diagnosis if sleep apnea. Results of study - 08/23/23 (in chart).

## 2023-09-14 ENCOUNTER — Encounter: Payer: Self-pay | Admitting: Sleep Medicine

## 2023-09-14 ENCOUNTER — Other Ambulatory Visit: Payer: Self-pay

## 2023-09-14 ENCOUNTER — Ambulatory Visit: Admitting: Sleep Medicine

## 2023-09-14 VITALS — BP 118/78 | HR 76 | Resp 16 | Ht 66.0 in | Wt 195.0 lb

## 2023-09-14 DIAGNOSIS — G4733 Obstructive sleep apnea (adult) (pediatric): Secondary | ICD-10-CM

## 2023-09-14 DIAGNOSIS — E785 Hyperlipidemia, unspecified: Secondary | ICD-10-CM

## 2023-09-14 DIAGNOSIS — Z6831 Body mass index (BMI) 31.0-31.9, adult: Secondary | ICD-10-CM

## 2023-09-14 DIAGNOSIS — E66811 Obesity, class 1: Secondary | ICD-10-CM | POA: Diagnosis not present

## 2023-09-14 MED ORDER — ZEPBOUND 5 MG/0.5ML ~~LOC~~ SOAJ
5.0000 mg | SUBCUTANEOUS | 2 refills | Status: DC
Start: 2023-09-14 — End: 2023-10-24

## 2023-09-14 NOTE — Progress Notes (Signed)
 Name:Carol Jimenez MRN: [id number] DOB: 08/11/1960   CHIEF COMPLAINT:  EXCESSIVE DAYTIME SLEEPINESS   HISTORY OF PRESENT ILLNESS:  Mrs.  is a 63 y.o. w/ a h/o GERD, hypothyroidism and obesity who presents to follow up on HST results. The patient underwent HST which revealed severe OSA (AHI 55, O2 nadir 77%).   EPWORTH SLEEP SCORE 14    08/09/2023    3:00 PM  Results of the Epworth flowsheet  Sitting and reading 1  Watching TV 3  Sitting, inactive in a public place (e.g. a theatre or a meeting) 3  As a passenger in a car for an hour without a break 1  Lying down to rest in the afternoon when circumstances permit 3  Sitting and talking to someone 0  Sitting quietly after a lunch without alcohol 3  In a car, while stopped for a few minutes in traffic 0  Total score 14     PAST MEDICAL HISTORY :   has a past medical history of Allergy, UTI (urinary tract infection), and Hypothyroidism.  has a past surgical history that includes Cesarean section; Endometrial ablation (2000); Breast biopsy (Left, 2015); and Knee surgery (Left). Prior to Admission medications   Medication Sig Start Date End Date Taking? Authorizing Provider  amoxicillin (AMOXIL) 500 MG capsule Take 1 capsule (500 mg total) by mouth 2 (two) times daily for 10 days. 09/10/23 09/20/23  Valinda Hoar, NP  ARMOUR THYROID 90 MG tablet TAKE 1 TABLET(90 MG) BY MOUTH DAILY 07/12/23   Dale [place], MD  estradiol (ESTRACE) 0.1 MG/GM vaginal cream INSERT 1 APPLICATORFUL VAGINALLY 2 TIMES A WEEK 07/12/23   Dale [place], MD  estradiol (ESTRACE) 0.5 MG tablet Take 1-2 tablets (0.5-1 mg total) by mouth daily. 07/12/23   Dale [place], MD  fluticasone (FLONASE) 50 MCG/ACT nasal spray Place 2 sprays into both nostrils daily. 06/27/20   Muthersbaugh, Dahlia Client, PA-C  hydrochlorothiazide (HYDRODIURIL) 25 MG tablet TAKE 1 TABLET(25 MG) BY MOUTH DAILY 07/12/23   Dale [place], MD  meloxicam (MOBIC) 7.5 MG tablet Take 15 mg  by mouth daily as needed for pain. 01/20/20   [provider]  pantoprazole (PROTONIX) 20 MG tablet Take 1 tablet (20 mg total) by mouth daily. 03/09/23   Dale [place], MD  progesterone (PROMETRIUM) 100 MG capsule TAKE 1 CAPSULE(100 MG) BY MOUTH DAILY 07/12/23   Dale [place], MD  scopolamine (TRANSDERM-SCOP) 1 MG/3DAYS Place 1 patch (1.5 mg total) onto the skin every 3 (three) days. 09/12/23   Dale [place], MD  sertraline (ZOLOFT) 50 MG tablet TAKE 1 TABLET(50 MG) BY MOUTH DAILY 07/12/23   Dale [place], MD  tirzepatide [facility]) 5 MG/0.5ML Pen Inject 5 mg into the skin once a week. 09/14/23   Dale [place], MD  tretinoin (RETIN-A) 0.025 % cream Apply 1 application topically as needed. 11/21/19   [provider]   Allergies  Allergen Reactions   Sulfa Antibiotics Rash    Topical cream with sulfa antibiotics caused rash    FAMILY HISTORY:  family history includes COPD in her mother; Colon polyps in an other family member; Congestive Heart Failure in her mother; Diabetes in her mother and sister; Esophageal cancer (age of onset: 40) in her father; Hypertension in her father, sister, and sister. SOCIAL HISTORY:  reports that she has never smoked. She has never used smokeless tobacco. She reports current alcohol use. She reports that she does not use drugs.   Review of Systems:  Gen:  Denies  fever, sweats, chills weight loss  HEENT: Denies blurred vision, double vision, ear pain, eye pain, hearing loss, nose bleeds, sore throat Cardiac:  No dizziness, chest pain or heaviness, chest tightness,edema, No JVD Resp:   No cough, -sputum production, -shortness of breath,-wheezing, -hemoptysis,  Gi: Denies swallowing difficulty, stomach pain, nausea or vomiting, diarrhea, constipation, bowel incontinence Gu:  Denies bladder incontinence, burning urine Ext:   Denies Joint pain, stiffness or swelling Skin: Denies  skin rash, easy bruising or bleeding or hives Endoc:  Denies  polyuria, polydipsia , polyphagia or weight change Psych:   Denies depression, insomnia or hallucinations  Other:  All other systems negative  VITAL SIGNS: BP 118/78   Pulse 76   Resp 16   Ht 5\' 6"  (1.676 m)   Wt 195 lb (88.5 kg)   LMP 12/26/2012   SpO2 97%   BMI 31.47 kg/m     Physical Examination:   General Appearance: No distress  EYES PERRLA, EOM intact.   NECK Supple, No JVD Pulmonary: normal breath sounds, No wheezing.  CardiovascularNormal S1,S2.  No m/r/g.   Abdomen: Benign, Soft, non-tender. Skin:   warm, no rashes, no ecchymosis  Extremities: normal, no cyanosis, clubbing. Neuro:without focal findings,  speech normal  PSYCHIATRIC: Mood, affect within normal limits.   ASSESSMENT AND PLAN  OSA Reviewed HST results with patient. Starting on APAP therapy at 4-16 cm H2O. Discussed the consequences of untreated sleep apnea. Advised not to drive drowsy for safety of patient and others. Will follow up in 3 months to review CPAP efficacy and compliance data.     Hyperlipidemia Stable, on current management. Following with PCP.   Obesity Counseled patient on diet and lifestyle modification. Currently on Zepbound as well.    Patient  satisfied with Plan of action and management. All questions answered   I spent a total of 21 minutes reviewing chart data, face-to-face evaluation with the patient, counseling and coordination of care as detailed above.    Tempie Hoist, M.D.  Sleep Medicine [place] [facility]

## 2023-09-14 NOTE — Patient Instructions (Signed)

## 2023-09-15 ENCOUNTER — Ambulatory Visit: Payer: BC Managed Care – PPO | Admitting: Sleep Medicine

## 2023-09-20 ENCOUNTER — Telehealth: Payer: Self-pay

## 2023-09-20 NOTE — Telephone Encounter (Signed)
 Patient needs new PA for Zepbound. Has now been diagnosed with "severe" sleep apnea.

## 2023-09-21 ENCOUNTER — Other Ambulatory Visit (HOSPITAL_COMMUNITY): Payer: Self-pay

## 2023-09-21 ENCOUNTER — Telehealth: Payer: Self-pay

## 2023-09-21 NOTE — Telephone Encounter (Signed)
 PA request has been Submitted. New Encounter has been or will be created for follow up. For additional info see Pharmacy Prior Auth telephone encounter from 09/21/2023.

## 2023-09-21 NOTE — Telephone Encounter (Signed)
 Pharmacy Patient Advocate Encounter   Received notification from Pt Calls Messages that prior authorization for Zepbound 5MG /0.5ML pen-injectors is required/requested.   Insurance verification completed.   The patient is insured through [facility] [place]  .   Per test claim: PA required; PA submitted to above mentioned insurance via CoverMyMeds Key/confirmation #/EOC [id number] Status is pending

## 2023-09-21 NOTE — Telephone Encounter (Signed)
 Pt aware. Will let her know determination.

## 2023-09-26 NOTE — Telephone Encounter (Signed)
 Pt has been notified she stated that she will wait for letter in mail pt verbalized understanding

## 2023-09-26 NOTE — Telephone Encounter (Signed)
 Pharmacy Patient Advocate Encounter  Received notification from [facility] [place]  that Prior Authorization for Zepbound 5MG /0.5ML pen-injectors  has been DENIED.  Full denial letter will be uploaded to the media tab. See denial reason below.   PA #/Case ID/Reference #: [id number]

## 2023-10-12 DIAGNOSIS — G4733 Obstructive sleep apnea (adult) (pediatric): Secondary | ICD-10-CM | POA: Diagnosis not present

## 2023-10-24 MED ORDER — TIRZEPATIDE-WEIGHT MANAGEMENT 7.5 MG/0.5ML ~~LOC~~ SOLN: 7.5000 mg | SUBCUTANEOUS | 2 refills | Status: DC

## 2023-10-24 NOTE — Telephone Encounter (Signed)
 Rx ok'd for zepbound  7.5mg . - rx sent to [facility].  If any problems let us  know.

## 2023-10-24 NOTE — Telephone Encounter (Signed)
 Pt requesting to move up in next dosage of Zepbound . Medication pended for approval

## 2023-11-02 ENCOUNTER — Encounter (HOSPITAL_COMMUNITY): Payer: Self-pay

## 2023-11-04 ENCOUNTER — Encounter (HOSPITAL_COMMUNITY): Payer: Self-pay

## 2023-11-08 ENCOUNTER — Other Ambulatory Visit: Payer: BLUE CROSS/BLUE SHIELD

## 2023-11-08 ENCOUNTER — Ambulatory Visit: Payer: Self-pay | Admitting: Internal Medicine

## 2023-11-08 DIAGNOSIS — R739 Hyperglycemia, unspecified: Secondary | ICD-10-CM

## 2023-11-08 DIAGNOSIS — E7800 Pure hypercholesterolemia, unspecified: Secondary | ICD-10-CM

## 2023-11-08 LAB — BASIC METABOLIC PANEL WITH GFR
BUN: 17 mg/dL (ref 6–23)
CO2: 29 meq/L (ref 19–32)
Calcium: 9.3 mg/dL (ref 8.4–10.5)
Chloride: 103 meq/L (ref 96–112)
Creatinine, Ser: 0.62 mg/dL (ref 0.40–1.20)
GFR: 94.6 mL/min (ref >60.00)
Glucose, Bld: 102 mg/dL — ABNORMAL HIGH (ref 70–99)
Potassium: 3.9 meq/L (ref 3.5–5.1)
Sodium: 138 meq/L (ref 135–145)

## 2023-11-08 LAB — LIPID PANEL
Cholesterol: 192 mg/dL (ref 0–200)
HDL: 47.9 mg/dL (ref >39.00)
LDL Cholesterol: 118 mg/dL — ABNORMAL HIGH (ref 0–99)
NonHDL: 143.63
Total CHOL/HDL Ratio: 4
Triglycerides: 130 mg/dL (ref 0.0–149.0)
VLDL: 26 mg/dL (ref 0.0–40.0)

## 2023-11-08 LAB — HEPATIC FUNCTION PANEL
ALT: 28 U/L (ref 0–35)
AST: 26 U/L (ref 0–37)
Albumin: 4.3 g/dL (ref 3.5–5.2)
Alkaline Phosphatase: 52 U/L (ref 39–117)
Bilirubin, Direct: 0.1 mg/dL (ref 0.0–0.3)
Total Bilirubin: 0.7 mg/dL (ref 0.2–1.2)
Total Protein: 7.1 g/dL (ref 6.0–8.3)

## 2023-11-08 LAB — TSH: TSH: 0.71 u[IU]/mL (ref 0.35–5.50)

## 2023-11-08 LAB — HEMOGLOBIN A1C: Hgb A1c MFr Bld: 5.6 % (ref 4.6–6.5)

## 2023-11-09 ENCOUNTER — Encounter

## 2023-11-11 ENCOUNTER — Ambulatory Visit (INDEPENDENT_AMBULATORY_CARE_PROVIDER_SITE_OTHER): Payer: BLUE CROSS/BLUE SHIELD | Admitting: Internal Medicine

## 2023-11-11 ENCOUNTER — Encounter: Payer: Self-pay | Admitting: Internal Medicine

## 2023-11-11 VITALS — BP 122/76 | HR 71 | Temp 98.7°F | Ht 65.5 in | Wt 187.6 lb

## 2023-11-11 DIAGNOSIS — R739 Hyperglycemia, unspecified: Secondary | ICD-10-CM | POA: Diagnosis not present

## 2023-11-11 DIAGNOSIS — Z713 Dietary counseling and surveillance: Secondary | ICD-10-CM

## 2023-11-11 DIAGNOSIS — Z0000 Encounter for general adult medical examination without abnormal findings: Secondary | ICD-10-CM | POA: Diagnosis not present

## 2023-11-11 DIAGNOSIS — E039 Hypothyroidism, unspecified: Secondary | ICD-10-CM | POA: Diagnosis not present

## 2023-11-11 DIAGNOSIS — E7800 Pure hypercholesterolemia, unspecified: Secondary | ICD-10-CM | POA: Diagnosis not present

## 2023-11-11 DIAGNOSIS — E559 Vitamin D deficiency, unspecified: Secondary | ICD-10-CM

## 2023-11-11 DIAGNOSIS — G4733 Obstructive sleep apnea (adult) (pediatric): Secondary | ICD-10-CM | POA: Diagnosis not present

## 2023-11-11 MED ORDER — PANTOPRAZOLE SODIUM 20 MG PO TBEC: 20.0000 mg | DELAYED_RELEASE_TABLET | Freq: Every day | ORAL | 3 refills | Status: AC

## 2023-11-11 MED ORDER — PROGESTERONE MICRONIZED 100 MG PO CAPS: ORAL_CAPSULE | ORAL | 1 refills | Status: DC

## 2023-11-11 MED ORDER — SERTRALINE HCL 50 MG PO TABS: ORAL_TABLET | ORAL | 1 refills | Status: DC

## 2023-11-11 MED ORDER — ARMOUR THYROID 90 MG PO TABS: ORAL_TABLET | ORAL | 1 refills | Status: DC

## 2023-11-11 MED ORDER — ESTRADIOL 0.5 MG PO TABS: 0.5000 mg | ORAL_TABLET | Freq: Every day | ORAL | 1 refills | Status: DC

## 2023-11-11 MED ORDER — HYDROCHLOROTHIAZIDE 25 MG PO TABS: ORAL_TABLET | ORAL | 1 refills | Status: DC

## 2023-11-11 MED ORDER — ESTRADIOL 0.1 MG/GM VA CREA: TOPICAL_CREAM | VAGINAL | 1 refills | Status: DC

## 2023-11-11 NOTE — Assessment & Plan Note (Addendum)
 Physical today 11/11/23.  PAP 09/16/21 - negative with negative HPV. Colonoscopy 09/2016 - TA.  F/u in 5 years. Had f/u colonoscopy with Dr Corky Diener. Need report. Mammogram 10/07/22 - Birads I. Scheduled for f/u mammogram 11/24/23.

## 2023-11-11 NOTE — Assessment & Plan Note (Signed)
 Doing well with zepbound . Planning to start 7.5mg  next week. Follow.

## 2023-11-11 NOTE — Progress Notes (Signed)
 Subjective:    Patient ID: Carol Jimenez, female    DOB: Apr 27, 1961, 63 y.o.   MRN: [id number]  Patient here for  Chief Complaint  Patient presents with   Annual Exam    HPI Here for a physical exam. Had f/u with Dr Kieran Pellet 09/14/23 - f/u OSA. Recommended to continue cpap. Continues on zoloft . Started on zepbound  last visit. Tolerating and doing well. Has lost weight. Planning to increase to 7.5mg  next week. Feels better. Knees not bothering her as much. Sleeping better. No chest pain or sob reported. No abdominal pain or bowel change reported. Discussed estrogen. Wants to remain on estrogen and progesterone  for now.   Past Medical History:  Diagnosis Date   Allergy    Hx: UTI (urinary tract infection)    Hypothyroidism    Past Surgical History:  Procedure Laterality Date   BREAST BIOPSY Left 2015   negative, stereotactic biopsy   CESAREAN SECTION     ENDOMETRIAL ABLATION  2000   KNEE SURGERY Left    Family History  Problem Relation Age of Onset   Congestive Heart Failure Mother    Diabetes Mother    COPD Mother    Hypertension Father    Esophageal cancer Father 71       treated at [facility]   Colon polyps Other    Hypertension Sister    Hypertension Sister    Diabetes Sister    Breast cancer Neg Hx    Social History   Socioeconomic History   Marital status: Married    Spouse name: Not on file   Number of children: Not on file   Years of education: Not on file   Highest education level: Bachelor's degree (e.g., BA, AB, BS)  Occupational History   Not on file  Tobacco Use   Smoking status: Never   Smokeless tobacco: Never  Vaping Use   Vaping status: Never Used  Substance and Sexual Activity   Alcohol use: Yes    Alcohol/week: 0.0 standard drinks of alcohol    Comment: rare   Drug use: No   Sexual activity: Not on file  Other Topics Concern   Not on file  Social History Narrative   Not on file   Social Drivers of Health   Financial Resource Strain: Low  Risk  (07/08/2023)   Overall Financial Resource Strain (CARDIA)    Difficulty of Paying Living Expenses: Not hard at all  Food Insecurity: No Food Insecurity (07/08/2023)   Hunger Vital Sign    Worried About Running Out of Food in the Last Year: Never true    Ran Out of Food in the Last Year: Never true  Transportation Needs: No Transportation Needs (07/08/2023)   PRAPARE - Administrator, Civil Service (Medical): No    Lack of Transportation (Non-Medical): No  Physical Activity: Insufficiently Active (07/08/2023)   Exercise Vital Sign    Days of Exercise per Week: 1 day    Minutes of Exercise per Session: 30 min  Stress: No Stress Concern Present (07/08/2023)   [employer] of Occupational Health - Occupational Stress Questionnaire    Feeling of Stress : Not at all  Social Connections: Socially Integrated (07/08/2023)   Social Connection and Isolation Panel [NHANES]    Frequency of Communication with Friends and Family: More than three times a week    Frequency of Social Gatherings with Friends and Family: More than three times a week    Attends Religious Services: More  than 4 times per year    Active Member of Clubs or Organizations: Yes    Attends Banker Meetings: More than 4 times per year    Marital Status: Married     Review of Systems  Constitutional:  Negative for fever and unexpected weight change.  HENT:  Negative for congestion, sinus pressure and sore throat.   Eyes:  Negative for pain and visual disturbance.  Respiratory:  Negative for cough, chest tightness and shortness of breath.   Cardiovascular:  Negative for chest pain, palpitations and leg swelling.  Gastrointestinal:  Negative for abdominal pain, diarrhea, nausea and vomiting.  Genitourinary:  Negative for difficulty urinating and dysuria.  Musculoskeletal:  Negative for joint swelling and myalgias.  Skin:  Negative for color change and rash.  Neurological:  Negative for dizziness  and headaches.  Hematological:  Negative for adenopathy. Does not bruise/bleed easily.  Psychiatric/Behavioral:  Negative for agitation and dysphoric mood.        Objective:     BP 122/76   Pulse 71   Temp 98.7 F (37.1 C)   Ht 5' 5.5" (1.664 m)   Wt 187 lb 9.6 oz (85.1 kg)   LMP 12/26/2012   SpO2 97%   BMI 30.74 kg/m  Wt Readings from Last 3 Encounters:  11/11/23 187 lb 9.6 oz (85.1 kg)  09/14/23 195 lb (88.5 kg)  08/09/23 200 lb 6.4 oz (90.9 kg)    Physical Exam Vitals reviewed.  Constitutional:      General: She is not in acute distress.    Appearance: Normal appearance. She is well-developed.  HENT:     Head: Normocephalic and atraumatic.     Right Ear: External ear normal.     Left Ear: External ear normal.     Mouth/Throat:     Pharynx: No oropharyngeal exudate or posterior oropharyngeal erythema.  Eyes:     General: No scleral icterus.       Right eye: No discharge.        Left eye: No discharge.     Conjunctiva/sclera: Conjunctivae normal.  Neck:     Thyroid : No thyromegaly.  Cardiovascular:     Rate and Rhythm: Normal rate and regular rhythm.  Pulmonary:     Effort: No tachypnea, accessory muscle usage or respiratory distress.     Breath sounds: Normal breath sounds. No decreased breath sounds or wheezing.  Chest:  Breasts:    Right: No inverted nipple, mass, nipple discharge or tenderness (no axillary adenopathy).     Left: No inverted nipple, mass, nipple discharge or tenderness (no axilarry adenopathy).  Abdominal:     General: Bowel sounds are normal.     Palpations: Abdomen is soft.     Tenderness: There is no abdominal tenderness.  Musculoskeletal:        General: No swelling or tenderness.     Cervical back: Neck supple.  Lymphadenopathy:     Cervical: No cervical adenopathy.  Skin:    Findings: No erythema or rash.  Neurological:     Mental Status: She is alert and oriented to person, place, and time.  Psychiatric:        Mood and  Affect: Mood normal.        Behavior: Behavior normal.         Outpatient Encounter Medications as of 11/11/2023  Medication Sig   meloxicam (MOBIC) 7.5 MG tablet Take 15 mg by mouth daily as needed for pain.   tirzepatide  7.5 MG/0.5ML  injection vial Inject 7.5 mg into the skin once a week.   ARMOUR THYROID  90 MG tablet TAKE 1 TABLET(90 MG) BY MOUTH DAILY   estradiol  (ESTRACE ) 0.1 MG/GM vaginal cream INSERT 1 APPLICATORFUL VAGINALLY 2 TIMES A WEEK   estradiol  (ESTRACE ) 0.5 MG tablet Take 1-2 tablets (0.5-1 mg total) by mouth daily.   hydrochlorothiazide  (HYDRODIURIL ) 25 MG tablet TAKE 1/2 TABLET(25 MG) BY MOUTH DAILY   pantoprazole  (PROTONIX ) 20 MG tablet Take 1 tablet (20 mg total) by mouth daily.   progesterone  (PROMETRIUM ) 100 MG capsule TAKE 1 CAPSULE(100 MG) BY MOUTH DAILY   sertraline  (ZOLOFT ) 50 MG tablet TAKE 1 TABLET(50 MG) BY MOUTH DAILY   [DISCONTINUED] ARMOUR THYROID  90 MG tablet TAKE 1 TABLET(90 MG) BY MOUTH DAILY   [DISCONTINUED] estradiol  (ESTRACE ) 0.1 MG/GM vaginal cream INSERT 1 APPLICATORFUL VAGINALLY 2 TIMES A WEEK   [DISCONTINUED] estradiol  (ESTRACE ) 0.5 MG tablet Take 1-2 tablets (0.5-1 mg total) by mouth daily.   [DISCONTINUED] fluticasone  (FLONASE ) 50 MCG/ACT nasal spray Place 2 sprays into both nostrils daily. (Patient not taking: Reported on 11/11/2023)   [DISCONTINUED] hydrochlorothiazide  (HYDRODIURIL ) 25 MG tablet TAKE 1 TABLET(25 MG) BY MOUTH DAILY   [DISCONTINUED] pantoprazole  (PROTONIX ) 20 MG tablet Take 1 tablet (20 mg total) by mouth daily.   [DISCONTINUED] progesterone  (PROMETRIUM ) 100 MG capsule TAKE 1 CAPSULE(100 MG) BY MOUTH DAILY   [DISCONTINUED] scopolamine  (TRANSDERM-SCOP) 1 MG/3DAYS Place 1 patch (1.5 mg total) onto the skin every 3 (three) days. (Patient not taking: Reported on 11/11/2023)   [DISCONTINUED] sertraline  (ZOLOFT ) 50 MG tablet TAKE 1 TABLET(50 MG) BY MOUTH DAILY   [DISCONTINUED] tretinoin (RETIN-A) 0.025 % cream Apply 1 application  topically as needed. (Patient not taking: Reported on 11/11/2023)   No facility-administered encounter medications on file as of 11/11/2023.     Lab Results  Component Value Date   WBC 6.9 07/08/2023   HGB 15.6 (H) 07/08/2023   HCT 46.7 (H) 07/08/2023   PLT 238.0 07/08/2023   GLUCOSE 102 (H) 11/08/2023   CHOL 192 11/08/2023   TRIG 130.0 11/08/2023   HDL 47.90 11/08/2023   LDLDIRECT 145.0 11/01/2022   LDLCALC 118 (H) 11/08/2023   ALT 28 11/08/2023   AST 26 11/08/2023   NA 138 11/08/2023   K 3.9 11/08/2023   CL 103 11/08/2023   CREATININE 0.62 11/08/2023   BUN 17 11/08/2023   CO2 29 11/08/2023   TSH 0.71 11/08/2023   HGBA1C 5.6 11/08/2023       Assessment & Plan:  Health care maintenance Assessment & Plan: Physical today 11/11/23.  PAP 09/16/21 - negative with negative HPV. Colonoscopy 09/2016 - TA.  F/u in 5 years. Had f/u colonoscopy with Dr Corky Diener. Need report. Mammogram 10/07/22 - Birads I. Scheduled for f/u mammogram 11/24/23.    Hypercholesterolemia Assessment & Plan: The 10-year ASCVD risk score (Arnett DK, et al., 2019) is: 5.4%   Values used to calculate the score:     Age: 23 years     Sex: Female     Is Non-Hispanic African American: No     Diabetic: No     Tobacco smoker: No     Systolic Blood Pressure: 118 mmHg     Is BP treated: Yes     HDL Cholesterol: 47.9 mg/dL     Total Cholesterol: 192 mg/dL  Low cholesterol diet and exercise.  Follow lipid panel. Discussed recent labs. Improved.   Orders: -     Lipid panel; Future -     Hepatic  function panel; Future -     Basic metabolic panel with GFR; Future  Hyperglycemia Assessment & Plan: Low carb diet and exercise.  Follow met b and a1c. Overall sugars have improved.  Lab Results  Component Value Date   HGBA1C 5.6 11/08/2023    Orders: -     Hemoglobin A1c; Future  Hypothyroidism, unspecified type Assessment & Plan: On thyroid  replacement.  Follow tsh. Recent check wnl.   Orders: -     TSH;  Future  Weight loss counseling, encounter for Assessment & Plan: Doing well with zepbound . Planning to start 7.5mg  next week. Follow.    Vitamin D  deficiency Assessment & Plan: Recheck vitamin D  level with next labs.   Orders: -     VITAMIN D  25 Hydroxy (Vit-D Deficiency, Fractures); Future  Other orders -     Armour Thyroid ; TAKE 1 TABLET(90 MG) BY MOUTH DAILY  Dispense: 90 tablet; Refill: 1 -     Estradiol ; INSERT 1 APPLICATORFUL VAGINALLY 2 TIMES A WEEK  Dispense: 42.5 g; Refill: 1 -     Estradiol ; Take 1-2 tablets (0.5-1 mg total) by mouth daily.  Dispense: 180 tablet; Refill: 1 -     hydroCHLOROthiazide ; TAKE 1/2 TABLET(25 MG) BY MOUTH DAILY  Dispense: 45 tablet; Refill: 1 -     Pantoprazole  Sodium; Take 1 tablet (20 mg total) by mouth daily.  Dispense: 90 tablet; Refill: 3 -     Progesterone ; TAKE 1 CAPSULE(100 MG) BY MOUTH DAILY  Dispense: 90 capsule; Refill: 1 -     Sertraline  HCl; TAKE 1 TABLET(50 MG) BY MOUTH DAILY  Dispense: 90 tablet; Refill: 1     Dellar Fenton, MD

## 2023-11-11 NOTE — Assessment & Plan Note (Signed)
On thyroid replacement.  Follow tsh. Recent check wnl.  

## 2023-11-11 NOTE — Assessment & Plan Note (Addendum)
 The 10-year ASCVD risk score (Arnett DK, et al., 2019) is: 5.4%   Values used to calculate the score:     Age: 63 years     Sex: Female     Is Non-Hispanic African American: No     Diabetic: No     Tobacco smoker: No     Systolic Blood Pressure: 118 mmHg     Is BP treated: Yes     HDL Cholesterol: 47.9 mg/dL     Total Cholesterol: 192 mg/dL  Low cholesterol diet and exercise.  Follow lipid panel. Discussed recent labs. Improved.

## 2023-11-11 NOTE — Assessment & Plan Note (Signed)
 Low carb diet and exercise.  Follow met b and a1c. Overall sugars have improved.  Lab Results  Component Value Date   HGBA1C 5.6 11/08/2023

## 2023-11-11 NOTE — Assessment & Plan Note (Signed)
Recheck vitamin D level with next labs.  

## 2023-11-17 ENCOUNTER — Other Ambulatory Visit: Payer: Self-pay | Admitting: Medical Genetics

## 2023-11-23 ENCOUNTER — Other Ambulatory Visit
Admission: RE | Admit: 2023-11-23 | Discharge: 2023-11-23 | Disposition: A | Discharge status: Home / Self Care | Payer: Self-pay | Source: Ambulatory Visit | Attending: Oncology | Admitting: Oncology

## 2023-11-24 ENCOUNTER — Ambulatory Visit
Admission: RE | Admit: 2023-11-24 | Discharge: 2023-11-24 | Disposition: A | Discharge status: Home / Self Care | Source: Ambulatory Visit | Attending: Internal Medicine | Admitting: Internal Medicine

## 2023-11-24 DIAGNOSIS — Z1231 Encounter for screening mammogram for malignant neoplasm of breast: Secondary | ICD-10-CM | POA: Insufficient documentation

## 2023-12-02 LAB — GENECONNECT MOLECULAR SCREEN: Genetic Analysis Overall Interpretation: NEGATIVE

## 2023-12-09 ENCOUNTER — Encounter: Payer: Self-pay | Admitting: Internal Medicine

## 2023-12-09 NOTE — Telephone Encounter (Signed)
 Per note, it states she plans to make an appt with Dr Janalyn Me. Does she need a refill until can get in with ortho. If so, ok to refill x 1.

## 2023-12-09 NOTE — Telephone Encounter (Signed)
 Are you okay with refilling this for her? See message below

## 2023-12-09 NOTE — Telephone Encounter (Signed)
 LMTCB

## 2023-12-09 NOTE — Telephone Encounter (Unsigned)
 Copied from CRM [id number]. Topic: General - Other >> Dec 09, 2023  4:42 PM Luane Rumps D wrote: Reason for CRM: Patient is returning a call from [place], advised her that she will be able to call back on Monday.

## 2023-12-12 ENCOUNTER — Other Ambulatory Visit: Payer: Self-pay

## 2023-12-12 DIAGNOSIS — G4733 Obstructive sleep apnea (adult) (pediatric): Secondary | ICD-10-CM | POA: Diagnosis not present

## 2023-12-12 MED ORDER — MELOXICAM 7.5 MG PO TABS
15.0000 mg | ORAL_TABLET | Freq: Every day | ORAL | 0 refills | Status: AC | PRN
Start: 2023-12-12 — End: ?

## 2023-12-12 NOTE — Telephone Encounter (Signed)
 Advised of below and refilled x1

## 2023-12-15 ENCOUNTER — Encounter: Payer: Self-pay | Admitting: Sleep Medicine

## 2023-12-15 ENCOUNTER — Ambulatory Visit: Admitting: Sleep Medicine

## 2023-12-15 VITALS — BP 120/72 | HR 64 | Temp 97.1°F | Ht 65.5 in | Wt 179.2 lb

## 2023-12-15 DIAGNOSIS — Z6831 Body mass index (BMI) 31.0-31.9, adult: Secondary | ICD-10-CM | POA: Diagnosis not present

## 2023-12-15 DIAGNOSIS — G4733 Obstructive sleep apnea (adult) (pediatric): Secondary | ICD-10-CM | POA: Diagnosis not present

## 2023-12-15 DIAGNOSIS — E66811 Obesity, class 1: Secondary | ICD-10-CM | POA: Diagnosis not present

## 2023-12-15 NOTE — Progress Notes (Signed)
 Name:Carol Jimenez MRN: [id number] DOB: [birth year]   CHIEF COMPLAINT:  CPAP F/U   HISTORY OF PRESENT ILLNESS:  Carol Jimenez is a 63 y.o. w/ a h/o OSA, GERD, hypothyroidism and obesity who presents for CPAP F/U visit. Reports difficulty adjusting to CPAP therapy due to mask leaks. She is currently using the Airfit N30i nasal mask. Reports not feeling as refreshed upon awakening with CPAP therapy.    EPWORTH SLEEP SCORE    08/09/2023    3:00 PM  Results of the Epworth flowsheet  Sitting and reading 1  Watching TV 3  Sitting, inactive in a public place (e.g. a theatre or a meeting) 3  As a passenger in a car for an hour without a break 1  Lying down to rest in the afternoon when circumstances permit 3  Sitting and talking to someone 0  Sitting quietly after a lunch without alcohol 3  In a car, while stopped for a few minutes in traffic 0  Total score 14    PAST MEDICAL HISTORY :   has a past medical history of Allergy, UTI (urinary tract infection), and Hypothyroidism.  has a past surgical history that includes Cesarean section; Endometrial ablation (2000); Breast biopsy (Left, 2015); and Knee surgery (Left). Prior to Admission medications   Medication Sig Start Date End Date Taking? Authorizing Provider  ARMOUR THYROID  90 MG tablet TAKE 1 TABLET(90 MG) BY MOUTH DAILY 11/11/23   Dellar Fenton, MD  estradiol  (ESTRACE ) 0.1 MG/GM vaginal cream INSERT 1 APPLICATORFUL VAGINALLY 2 TIMES A WEEK 11/11/23   Dellar Fenton, MD  estradiol  (ESTRACE ) 0.5 MG tablet Take 1-2 tablets (0.5-1 mg total) by mouth daily. 11/11/23   Dellar Fenton, MD  hydrochlorothiazide  (HYDRODIURIL ) 25 MG tablet TAKE 1/2 TABLET(25 MG) BY MOUTH DAILY 11/11/23   Dellar Fenton, MD  meloxicam (MOBIC) 7.5 MG tablet Take 2 tablets (15 mg total) by mouth daily as needed for pain. 12/12/23   Dellar Fenton, MD  pantoprazole  (PROTONIX ) 20 MG tablet Take 1 tablet (20 mg total) by mouth daily. 11/11/23   Dellar Fenton, MD  progesterone  (PROMETRIUM ) 100 MG capsule TAKE 1 CAPSULE(100 MG) BY MOUTH DAILY 11/11/23   Dellar Fenton, MD  sertraline  (ZOLOFT ) 50 MG tablet TAKE 1 TABLET(50 MG) BY MOUTH DAILY 11/11/23   Dellar Fenton, MD  tirzepatide  7.5 MG/0.5ML injection vial Inject 7.5 mg into the skin once a week. 10/24/23   Dellar Fenton, MD   Allergies  Allergen Reactions   Sulfa Antibiotics Rash    Topical cream with sulfa antibiotics caused rash    FAMILY HISTORY:  family history includes COPD in her mother; Colon polyps in an other family member; Congestive Heart Failure in her mother; Diabetes in her mother and sister; Esophageal cancer (age of onset: 27) in her father; Hypertension in her father, sister, and sister. SOCIAL HISTORY:  reports that she has never smoked. She has never used smokeless tobacco. She reports current alcohol use. She reports that she does not use drugs.   Review of Systems:  Gen:  Denies  fever, sweats, chills weight loss  HEENT: Denies blurred vision, double vision, ear pain, eye pain, hearing loss, nose bleeds, sore throat Cardiac:  No dizziness, chest pain or heaviness, chest tightness,edema, No JVD Resp:   No cough, -sputum production, -shortness of breath,-wheezing, -hemoptysis,  Gi: Denies swallowing difficulty, stomach pain, nausea or vomiting, diarrhea, constipation, bowel incontinence Gu:  Denies bladder incontinence, burning urine Ext:   Denies Joint  pain, stiffness or swelling Skin: Denies  skin rash, easy bruising or bleeding or hives Endoc:  Denies polyuria, polydipsia , polyphagia or weight change Psych:   Denies depression, insomnia or hallucinations  Other:  All other systems negative  VITAL SIGNS: BP 120/72 (BP Location: Right Arm, Cuff Size: Normal)   Pulse 64   Temp (!) 97.1 F (36.2 C)   Ht 5' 5.5 (1.664 m)   Wt 179 lb 3.2 oz (81.3 kg)   LMP 12/26/2012   SpO2 100%   BMI 29.37 kg/m     Physical Examination:   General Appearance:  No distress  EYES PERRLA, EOM intact.   NECK Supple, No JVD Pulmonary: normal breath sounds, No wheezing.  CardiovascularNormal S1,S2.  No m/r/g.   Abdomen: Benign, Soft, non-tender. Skin:   warm, no rashes, no ecchymosis  Extremities: normal, no cyanosis, clubbing. Neuro:without focal findings,  speech normal  PSYCHIATRIC: Mood, affect within normal limits.   ASSESSMENT AND PLAN  OSA Patient is using and benefiting from CPAP therapy. Counseled patient on increasing CPAP compliance, also counseled patient on proper mask fit. Discussed the consequences of untreated sleep apnea. Advised not to drive drowsy for safety of patient and others. Will follow up in 3 months.   HTN Stable, on current management. Following with PCP.    Patient  satisfied with Plan of action and management. All questions answered  I spent a total of 24 minutes reviewing chart data, face-to-face evaluation with the patient, counseling and coordination of care as detailed above.    Felicita Nuncio, M.D.  Sleep Medicine [facility]

## 2023-12-26 ENCOUNTER — Other Ambulatory Visit: Payer: Self-pay | Admitting: Internal Medicine

## 2024-01-02 ENCOUNTER — Encounter: Payer: Self-pay | Admitting: Internal Medicine

## 2024-01-02 MED ORDER — ZEPBOUND 10 MG/0.5ML ~~LOC~~ SOLN: 10.0000 mg | SUBCUTANEOUS | 2 refills | Status: DC

## 2024-01-02 NOTE — Telephone Encounter (Signed)
 Rx sent in for zepbound  10mg  to take weekly.

## 2024-01-02 NOTE — Telephone Encounter (Signed)
 Medication pended.

## 2024-02-10 DIAGNOSIS — G4733 Obstructive sleep apnea (adult) (pediatric): Secondary | ICD-10-CM | POA: Diagnosis not present

## 2024-02-22 ENCOUNTER — Other Ambulatory Visit: Payer: Self-pay | Admitting: Internal Medicine

## 2024-03-13 ENCOUNTER — Other Ambulatory Visit (INDEPENDENT_AMBULATORY_CARE_PROVIDER_SITE_OTHER)

## 2024-03-13 ENCOUNTER — Ambulatory Visit: Payer: Self-pay | Admitting: Internal Medicine

## 2024-03-13 DIAGNOSIS — E7800 Pure hypercholesterolemia, unspecified: Secondary | ICD-10-CM | POA: Diagnosis not present

## 2024-03-13 DIAGNOSIS — E039 Hypothyroidism, unspecified: Secondary | ICD-10-CM

## 2024-03-13 DIAGNOSIS — R739 Hyperglycemia, unspecified: Secondary | ICD-10-CM | POA: Diagnosis not present

## 2024-03-13 DIAGNOSIS — E559 Vitamin D deficiency, unspecified: Secondary | ICD-10-CM

## 2024-03-13 LAB — HEPATIC FUNCTION PANEL
ALT: 17 U/L (ref 0–35)
AST: 21 U/L (ref 0–37)
Albumin: 4.3 g/dL (ref 3.5–5.2)
Alkaline Phosphatase: 53 U/L (ref 39–117)
Bilirubin, Direct: 0.2 mg/dL (ref 0.0–0.3)
Total Bilirubin: 0.8 mg/dL (ref 0.2–1.2)
Total Protein: 6.7 g/dL (ref 6.0–8.3)

## 2024-03-13 LAB — LIPID PANEL
Cholesterol: 178 mg/dL (ref 0–200)
HDL: 43.4 mg/dL (ref >39.00)
LDL Cholesterol: 115 mg/dL — ABNORMAL HIGH (ref 0–99)
NonHDL: 134.15
Total CHOL/HDL Ratio: 4
Triglycerides: 95 mg/dL (ref 0.0–149.0)
VLDL: 19 mg/dL (ref 0.0–40.0)

## 2024-03-13 LAB — TSH: TSH: 0.55 u[IU]/mL (ref 0.35–5.50)

## 2024-03-13 LAB — BASIC METABOLIC PANEL WITH GFR
BUN: 19 mg/dL (ref 6–23)
CO2: 29 meq/L (ref 19–32)
Calcium: 9.4 mg/dL (ref 8.4–10.5)
Chloride: 104 meq/L (ref 96–112)
Creatinine, Ser: 0.6 mg/dL (ref 0.40–1.20)
GFR: 95.12 mL/min (ref >60.00)
Glucose, Bld: 85 mg/dL (ref 70–99)
Potassium: 3.9 meq/L (ref 3.5–5.1)
Sodium: 141 meq/L (ref 135–145)

## 2024-03-13 LAB — VITAMIN D 25 HYDROXY (VIT D DEFICIENCY, FRACTURES): VITD: 45 ng/mL (ref 30.00–100.00)

## 2024-03-13 LAB — HEMOGLOBIN A1C: Hgb A1c MFr Bld: 5.8 % (ref 4.6–6.5)

## 2024-03-16 ENCOUNTER — Ambulatory Visit: Admitting: Internal Medicine

## 2024-03-16 ENCOUNTER — Encounter: Payer: Self-pay | Admitting: Internal Medicine

## 2024-03-16 VITALS — BP 112/62 | HR 96 | Temp 97.6°F | Ht 65.0 in | Wt 167.6 lb

## 2024-03-16 DIAGNOSIS — E559 Vitamin D deficiency, unspecified: Secondary | ICD-10-CM | POA: Diagnosis not present

## 2024-03-16 DIAGNOSIS — R739 Hyperglycemia, unspecified: Secondary | ICD-10-CM | POA: Diagnosis not present

## 2024-03-16 DIAGNOSIS — F439 Reaction to severe stress, unspecified: Secondary | ICD-10-CM

## 2024-03-16 DIAGNOSIS — Z713 Dietary counseling and surveillance: Secondary | ICD-10-CM | POA: Diagnosis not present

## 2024-03-16 DIAGNOSIS — E039 Hypothyroidism, unspecified: Secondary | ICD-10-CM

## 2024-03-16 DIAGNOSIS — E7800 Pure hypercholesterolemia, unspecified: Secondary | ICD-10-CM | POA: Diagnosis not present

## 2024-03-16 MED ORDER — ARMOUR THYROID 90 MG PO TABS: ORAL_TABLET | ORAL | 1 refills | Status: DC

## 2024-03-16 MED ORDER — PROGESTERONE MICRONIZED 100 MG PO CAPS: ORAL_CAPSULE | ORAL | 1 refills | Status: DC

## 2024-03-16 MED ORDER — SERTRALINE HCL 50 MG PO TABS: ORAL_TABLET | ORAL | 1 refills | Status: DC

## 2024-03-16 MED ORDER — ESTRADIOL 0.1 MG/GM VA CREA: TOPICAL_CREAM | VAGINAL | 1 refills | Status: AC

## 2024-03-16 MED ORDER — ESTRADIOL 0.5 MG PO TABS: 0.5000 mg | ORAL_TABLET | Freq: Every day | ORAL | 1 refills | Status: AC

## 2024-03-16 NOTE — Assessment & Plan Note (Addendum)
 The 10-year ASCVD risk score (Arnett DK, et al., 2019) is: 6.2%   Values used to calculate the score:     Age: 63 years     Clincally relevant sex: Female     Is Non-Hispanic African American: No     Diabetic: No     Tobacco smoker: No     Systolic Blood Pressure: 120 mmHg     Is BP treated: Yes     HDL Cholesterol: 43.4 mg/dL     Total Cholesterol: 178 mg/dL  Low cholesterol diet and exercise.  Follow lipid panel. Discussed recent labs.

## 2024-03-16 NOTE — Progress Notes (Signed)
 Subjective:    Patient ID: Carol Jimenez, female    DOB: 11/09/1960, 63 y.o.   MRN: [id number]  Patient here for  Chief Complaint  Patient presents with   Medical Management of Chronic Issues    4 mo f/u    HPI Here for a scheduled follow up - follow up regarding weight loss and zepbound  use, OSA and hypercholesterolemia. Had f/u with Dr Jess 12/15/23 - f/u OSA. Continues cpap. Stays active. Has continued to lose weight. No chest pain or sob reported. No abdominal pain or bowel change reported. Blood pressure as outlined. Only taking hydrochlorothiazide  once per week. Given diet change, weight loss and blood pressure - will hold hydrochlorothiazide  for now.    Past Medical History:  Diagnosis Date   Allergy    Hx: UTI (urinary tract infection)    Hypothyroidism    Past Surgical History:  Procedure Laterality Date   BREAST BIOPSY Left 2015   negative, stereotactic biopsy   CESAREAN SECTION     ENDOMETRIAL ABLATION  2000   KNEE SURGERY Left    Family History  Problem Relation Age of Onset   Congestive Heart Failure Mother    Diabetes Mother    COPD Mother    Hypertension Father    Esophageal cancer Father 27       treated at [facility]   Colon polyps Other    Hypertension Sister    Hypertension Sister    Diabetes Sister    Breast cancer Neg Hx    Social History   Socioeconomic History   Marital status: Married    Spouse name: Not on file   Number of children: Not on file   Years of education: Not on file   Highest education level: Bachelor's degree (e.g., BA, AB, BS)  Occupational History   Not on file  Tobacco Use   Smoking status: Never   Smokeless tobacco: Never  Vaping Use   Vaping status: Never Used  Substance and Sexual Activity   Alcohol use: Yes    Alcohol/week: 0.0 standard drinks of alcohol    Comment: rare   Drug use: No   Sexual activity: Not on file  Other Topics Concern   Not on file  Social History Narrative   Not on file   Social  Drivers of Health   Financial Resource Strain: Low Risk  (03/12/2024)   Overall Financial Resource Strain (CARDIA)    Difficulty of Paying Living Expenses: Not hard at all  Food Insecurity: No Food Insecurity (03/12/2024)   Hunger Vital Sign    Worried About Running Out of Food in the Last Year: Never true    Ran Out of Food in the Last Year: Never true  Transportation Needs: No Transportation Needs (03/12/2024)   PRAPARE - Administrator, Civil Service (Medical): No    Lack of Transportation (Non-Medical): No  Physical Activity: Insufficiently Active (03/12/2024)   Exercise Vital Sign    Days of Exercise per Week: 3 days    Minutes of Exercise per Session: 30 min  Stress: No Stress Concern Present (03/12/2024)   [employer] of Occupational Health - Occupational Stress Questionnaire    Feeling of Stress: Not at all  Social Connections: Socially Integrated (03/12/2024)   Social Connection and Isolation Panel    Frequency of Communication with Friends and Family: More than three times a week    Frequency of Social Gatherings with Friends and Family: More than three times a week  Attends Religious Services: More than 4 times per year    Active Member of Clubs or Organizations: Yes    Attends Banker Meetings: More than 4 times per year    Marital Status: Married     Review of Systems  Constitutional:  Negative for appetite change and unexpected weight change.  HENT:  Negative for congestion and sinus pressure.   Respiratory:  Negative for cough, chest tightness and shortness of breath.   Cardiovascular:  Negative for chest pain, palpitations and leg swelling.  Gastrointestinal:  Negative for abdominal pain, diarrhea, nausea and vomiting.  Genitourinary:  Negative for difficulty urinating and dysuria.  Musculoskeletal:  Negative for joint swelling and myalgias.  Skin:  Negative for color change and rash.  Neurological:  Negative for dizziness and  headaches.  Psychiatric/Behavioral:  Negative for agitation and dysphoric mood.        Objective:     BP 112/62   Pulse 96   Temp 97.6 F (36.4 C) (Oral)   Ht 5' 5 (1.651 m)   Wt 167 lb 9.6 oz (76 kg)   LMP 12/26/2012   SpO2 96%   BMI 27.89 kg/m  Wt Readings from Last 3 Encounters:  03/16/24 167 lb 9.6 oz (76 kg)  12/15/23 179 lb 3.2 oz (81.3 kg)  11/11/23 187 lb 9.6 oz (85.1 kg)    Physical Exam Vitals reviewed.  Constitutional:      General: She is not in acute distress.    Appearance: Normal appearance.  HENT:     Head: Normocephalic and atraumatic.     Right Ear: External ear normal.     Left Ear: External ear normal.     Mouth/Throat:     Pharynx: No oropharyngeal exudate or posterior oropharyngeal erythema.  Eyes:     General: No scleral icterus.       Right eye: No discharge.        Left eye: No discharge.     Conjunctiva/sclera: Conjunctivae normal.  Neck:     Thyroid : No thyromegaly.  Cardiovascular:     Rate and Rhythm: Normal rate and regular rhythm.  Pulmonary:     Effort: No respiratory distress.     Breath sounds: Normal breath sounds. No wheezing.  Abdominal:     General: Bowel sounds are normal.     Palpations: Abdomen is soft.     Tenderness: There is no abdominal tenderness.  Musculoskeletal:        General: No swelling or tenderness.     Cervical back: Neck supple. No tenderness.  Lymphadenopathy:     Cervical: No cervical adenopathy.  Skin:    Findings: No erythema or rash.  Neurological:     Mental Status: She is alert.  Psychiatric:        Mood and Affect: Mood normal.        Behavior: Behavior normal.         Outpatient Encounter Medications as of 03/16/2024  Medication Sig   hydrochlorothiazide  (HYDRODIURIL ) 25 MG tablet TAKE 1/2 TABLET BY MOUTH DAILY   meloxicam  (MOBIC ) 7.5 MG tablet Take 2 tablets (15 mg total) by mouth daily as needed for pain.   pantoprazole  (PROTONIX ) 20 MG tablet Take 1 tablet (20 mg total) by  mouth daily.   tirzepatide  (ZEPBOUND ) 10 MG/0.5ML injection vial Inject 10 mg into the skin once a week.   ARMOUR THYROID  90 MG tablet TAKE 1 TABLET(90 MG) BY MOUTH DAILY   estradiol  (ESTRACE ) 0.1 MG/GM vaginal  cream INSERT 1 APPLICATORFUL VAGINALLY 2 TIMES A WEEK   estradiol  (ESTRACE ) 0.5 MG tablet Take 1-2 tablets (0.5-1 mg total) by mouth daily.   progesterone  (PROMETRIUM ) 100 MG capsule TAKE 1 CAPSULE(100 MG) BY MOUTH DAILY   sertraline  (ZOLOFT ) 50 MG tablet TAKE 1 TABLET(50 MG) BY MOUTH DAILY   [DISCONTINUED] ARMOUR THYROID  90 MG tablet TAKE 1 TABLET(90 MG) BY MOUTH DAILY   [DISCONTINUED] estradiol  (ESTRACE ) 0.1 MG/GM vaginal cream INSERT 1 APPLICATORFUL VAGINALLY 2 TIMES A WEEK   [DISCONTINUED] estradiol  (ESTRACE ) 0.5 MG tablet Take 1-2 tablets (0.5-1 mg total) by mouth daily.   [DISCONTINUED] progesterone  (PROMETRIUM ) 100 MG capsule TAKE 1 CAPSULE(100 MG) BY MOUTH DAILY   [DISCONTINUED] sertraline  (ZOLOFT ) 50 MG tablet TAKE 1 TABLET(50 MG) BY MOUTH DAILY   No facility-administered encounter medications on file as of 03/16/2024.     Lab Results  Component Value Date   WBC 6.9 07/08/2023   HGB 15.6 (H) 07/08/2023   HCT 46.7 (H) 07/08/2023   PLT 238.0 07/08/2023   GLUCOSE 85 03/13/2024   CHOL 178 03/13/2024   TRIG 95.0 03/13/2024   HDL 43.40 03/13/2024   LDLDIRECT 145.0 11/01/2022   LDLCALC 115 (H) 03/13/2024   ALT 17 03/13/2024   AST 21 03/13/2024   NA 141 03/13/2024   K 3.9 03/13/2024   CL 104 03/13/2024   CREATININE 0.60 03/13/2024   BUN 19 03/13/2024   CO2 29 03/13/2024   TSH 0.55 03/13/2024   HGBA1C 5.8 03/13/2024    MM 3D SCREENING MAMMOGRAM BILATERAL BREAST Result Date: 11/29/2023 CLINICAL DATA:  Screening. EXAM: DIGITAL SCREENING BILATERAL MAMMOGRAM WITH TOMOSYNTHESIS AND CAD TECHNIQUE: Bilateral screening digital craniocaudal and mediolateral oblique mammograms were obtained. Bilateral screening digital breast tomosynthesis was performed. The images were evaluated  with computer-aided detection. COMPARISON:  Previous exam(s). ACR Breast Density Category b: There are scattered areas of fibroglandular density. FINDINGS: There are no findings suspicious for malignancy. IMPRESSION: No mammographic evidence of malignancy. A result letter of this screening mammogram will be mailed directly to the patient. RECOMMENDATION: Screening mammogram in one year. (Code:SM-B-01Y) BI-RADS CATEGORY  1: Negative. Electronically Signed   By: Alm Parkins M.D.   On: 11/29/2023 08:58       Assessment & Plan:  Weight loss counseling, encounter for Assessment & Plan: Continues on zepbound  10mg  weekly. Continuing to lose weight. Discussed protein intake. Continue exercise. Continue to stay hydrated. Hold on making changes in the dose. Follow.    Hypercholesterolemia Assessment & Plan: The 10-year ASCVD risk score (Arnett DK, et al., 2019) is: 6.2%   Values used to calculate the score:     Age: 71 years     Clincally relevant sex: Female     Is Non-Hispanic African American: No     Diabetic: No     Tobacco smoker: No     Systolic Blood Pressure: 120 mmHg     Is BP treated: Yes     HDL Cholesterol: 43.4 mg/dL     Total Cholesterol: 178 mg/dL  Low cholesterol diet and exercise.  Follow lipid panel. Discussed recent labs.   Orders: -     Lipid panel; Future -     Hepatic function panel; Future -     Basic metabolic panel with GFR; Future  Hyperglycemia Assessment & Plan: Low carb diet and exercise.  Follow met b and A1c.  Lab Results  Component Value Date   HGBA1C 5.8 03/13/2024    Orders: -     Hemoglobin  A1c; Future  Vitamin D  deficiency Assessment & Plan: Vitamin D  level wnl - 02/2024.    Stress Assessment & Plan: Overall appears to be doing well. On zoloft . Stable. Follow.    Hypothyroidism, unspecified type Assessment & Plan: On thyroid  replacement. Follow tsh.    Other orders -     Armour Thyroid ; TAKE 1 TABLET(90 MG) BY MOUTH DAILY  Dispense:  90 tablet; Refill: 1 -     Estradiol ; INSERT 1 APPLICATORFUL VAGINALLY 2 TIMES A WEEK  Dispense: 42.5 g; Refill: 1 -     Estradiol ; Take 1-2 tablets (0.5-1 mg total) by mouth daily.  Dispense: 180 tablet; Refill: 1 -     Progesterone ; TAKE 1 CAPSULE(100 MG) BY MOUTH DAILY  Dispense: 90 capsule; Refill: 1 -     Sertraline  HCl; TAKE 1 TABLET(50 MG) BY MOUTH DAILY  Dispense: 90 tablet; Refill: 1     Allena Hamilton, MD

## 2024-03-17 ENCOUNTER — Encounter: Payer: Self-pay | Admitting: Internal Medicine

## 2024-03-17 NOTE — Assessment & Plan Note (Signed)
 Overall appears to be doing well. On zoloft . Stable. Follow.

## 2024-03-17 NOTE — Assessment & Plan Note (Signed)
 On thyroid replacement.  Follow tsh.

## 2024-03-17 NOTE — Assessment & Plan Note (Signed)
 Vitamin D  level wnl - 02/2024.

## 2024-03-17 NOTE — Assessment & Plan Note (Signed)
 Low carb diet and exercise.  Follow met b and A1c.  Lab Results  Component Value Date   HGBA1C 5.8 03/13/2024

## 2024-03-17 NOTE — Assessment & Plan Note (Signed)
 Continues on zepbound  10mg  weekly. Continuing to lose weight. Discussed protein intake. Continue exercise. Continue to stay hydrated. Hold on making changes in the dose. Follow.

## 2024-03-19 ENCOUNTER — Ambulatory Visit: Admitting: Sleep Medicine

## 2024-03-29 DIAGNOSIS — D1801 Hemangioma of skin and subcutaneous tissue: Secondary | ICD-10-CM | POA: Diagnosis not present

## 2024-03-29 DIAGNOSIS — L570 Actinic keratosis: Secondary | ICD-10-CM | POA: Diagnosis not present

## 2024-03-29 DIAGNOSIS — L720 Epidermal cyst: Secondary | ICD-10-CM | POA: Diagnosis not present

## 2024-03-29 DIAGNOSIS — L821 Other seborrheic keratosis: Secondary | ICD-10-CM | POA: Diagnosis not present

## 2024-04-11 ENCOUNTER — Other Ambulatory Visit: Payer: Self-pay | Admitting: Internal Medicine

## 2024-05-21 ENCOUNTER — Ambulatory Visit: Admitting: Sleep Medicine

## 2024-05-31 DIAGNOSIS — M5412 Radiculopathy, cervical region: Secondary | ICD-10-CM | POA: Diagnosis not present

## 2024-05-31 DIAGNOSIS — M51362 Other intervertebral disc degeneration, lumbar region with discogenic back pain and lower extremity pain: Secondary | ICD-10-CM | POA: Diagnosis not present

## 2024-05-31 DIAGNOSIS — M791 Myalgia, unspecified site: Secondary | ICD-10-CM | POA: Diagnosis not present

## 2024-06-11 ENCOUNTER — Ambulatory Visit: Admitting: Sleep Medicine

## 2024-06-22 DIAGNOSIS — M5416 Radiculopathy, lumbar region: Secondary | ICD-10-CM | POA: Diagnosis not present

## 2024-06-26 ENCOUNTER — Telehealth: Admitting: Physician Assistant

## 2024-06-26 DIAGNOSIS — J019 Acute sinusitis, unspecified: Secondary | ICD-10-CM

## 2024-06-26 DIAGNOSIS — B9689 Other specified bacterial agents as the cause of diseases classified elsewhere: Secondary | ICD-10-CM

## 2024-06-26 MED ORDER — AMOXICILLIN-POT CLAVULANATE 875-125 MG PO TABS: 1.0000 | ORAL_TABLET | Freq: Two times a day (BID) | ORAL | 0 refills | Status: DC

## 2024-06-26 NOTE — Progress Notes (Signed)

## 2024-07-04 ENCOUNTER — Encounter: Payer: Self-pay | Admitting: Internal Medicine

## 2024-07-04 ENCOUNTER — Encounter

## 2024-07-05 ENCOUNTER — Telehealth: Admitting: Family

## 2024-07-05 VITALS — Ht 65.0 in | Wt 167.1 lb

## 2024-07-05 DIAGNOSIS — U071 COVID-19: Secondary | ICD-10-CM | POA: Insufficient documentation

## 2024-07-05 MED ORDER — NIRMATRELVIR/RITONAVIR (PAXLOVID)TABLET: 3.0000 | ORAL_TABLET | Freq: Two times a day (BID) | ORAL | 0 refills | Status: AC

## 2024-07-05 NOTE — Patient Instructions (Addendum)
 Nirmatrelvir  and Ritonavir  may decrease therapeutic effects of Thyroid  Products. Protease Inhibitors may decrease serum concentrations of Estrogen Derivatives. Protease Inhibitors may increase serum concentrations of Estrogen Derivatives.   You no longer have to quarantine per cdc guidelines as long as you are fever free without antipyretic (Tylenol or ibuprofen) for 24 hours.    We discussed starting Paxlovid  .  There are benefits and risks of taking this treatment as outlined in the Fact Sheet for Patients and Caregivers. You may find this document here and please read in detail   [email or web address]   I have sent Paxlovid  to your pharmacy. Please call pharmacy so they bring medication out to your car and you do not have to go inside.   PAXLOVID  ADMINISTRATION INSTRUCTIONS:  Take with or without food. Swallow the tablets whole. Don't chew, crush, or break the medications because it might not work as well  For each dose of the medication, you should be taking 3 tablets together (2 pink oval and 1 white oval) TWICE a day for FIVE days   Finish your full five-day course of Paxlovid  even if you feel better before you're done. Stopping this medication too early can make it less effective to prevent severe illness related to COVID19.    Paxlovid  is prescribed for YOU ONLY. Don't share it with others, even if they have similar symptoms as you. This medication might not be right for everyone.  Make sure to take steps to protect yourself and others while you're taking this medication in order to get well soon and to prevent others from getting sick with COVID-19.  Paxlovid  (nirmatrelvir  / ritonavir ) can cause hormonal birth control medications to not work well. If you or your partner is currently taking hormonal birth control, use condoms or other birth control methods to prevent unintended pregnancies.   COMMON SIDE EFFECTS: Altered or bad taste in your mouth  Diarrhea   High blood pressure (1% of people) Muscle aches (1% of people)    If your COVID-19 symptoms get worse, get medical help right away. Call 911 if you experience symptoms such as worsening cough, trouble breathing, chest pain that doesn't go away, confusion, a hard time staying awake, and pale or blue-colored skin.This medication won't prevent all COVID-19 cases from getting worse.

## 2024-07-05 NOTE — Progress Notes (Signed)
 Virtual Visit via Video Note  I connected with Carol Jimenez on 07/05/2024 at 10:00 AM EST by a video enabled telemedicine application and verified that I am speaking with the correct person using two identifiers. Location patient: home Location provider: work  Persons participating in the virtual visit: patient, provider  I discussed the limitations of evaluation and management by telemedicine and the availability of in person appointments. The patient expressed understanding and agreed to proceed.  HPI: Positive COVID home test 07/04/2024. She complains of cough, dull headache, fatigue. Symptoms started 3 days ago with feeling 'yucky' and 'slight cough'. She is slightly better today  Denies fever, chills, wheezing, sob, cp, vision changes.  Test negative for flu at home.  Husband positive covid  She has taken mucinex and advil. She has been using promethazine DM at bedtime with relief.   Augmentin  completed yesterday and she felt sinus pressure had improved.   She has had covid in the past.   History of hyperglycemia, hypothyroidism Treated for virtual visit for suspected sinusitis 06/26/2024, she has completed Augmentin  No history of CKD.  ROS: See pertinent positives and negatives per HPI.  EXAM:  VITALS per patient if applicable: Ht 5' 5 (1.651 m)   Wt 167 lb 1.6 oz (75.8 kg)   LMP 12/26/2012   BMI 27.81 kg/m  BP Readings from Last 3 Encounters:  03/16/24 112/62  12/15/23 120/72  11/11/23 122/76   Wt Readings from Last 3 Encounters:  07/05/24 167 lb 1.6 oz (75.8 kg)  03/16/24 167 lb 9.6 oz (76 kg)  12/15/23 179 lb 3.2 oz (81.3 kg)    GENERAL: alert, oriented, appears well and in no acute distress  HEENT: atraumatic, conjunttiva clear, no obvious abnormalities on inspection of external nose and ears  NECK: normal movements of the head and neck  LUNGS: on inspection no signs of respiratory distress, breathing rate appears normal, no obvious gross SOB, gasping  or wheezing  CV: no obvious cyanosis  MS: moves all visible extremities without noticeable abnormality  PSYCH/NEURO: pleasant and cooperative, no obvious depression or anxiety, speech and thought processing grossly intact  ASSESSMENT AND PLAN: COVID-19 Assessment & Plan: No acute respiratory distress. she is not labored in her speech over virtual visit.  She is afebrile.  Patient prefers to start Paxlovid  to potentially shorten duration of symptoms, minimize risk of severe symptoms.  Discussed drug interactions with Paxlovid , Armour Thyroid , oral estrogen.  She has follow-up with PCP and will inquire about TSH recheck.  She may continue Promethazine DM, Mucinex at home.  She will let me know of any new concerns.   Other orders -     nirmatrelvir /ritonavir ; Take 3 tablets by mouth 2 (two) times daily for 5 days. (Take nirmatrelvir  150 mg two tablets twice daily for 5 days and ritonavir  100 mg one tablet twice daily for 5 days) Patient GFR is 95  Dispense: 30 tablet; Refill: 0     -we discussed possible serious and likely etiologies, options for evaluation and workup, limitations of telemedicine visit vs in person visit, treatment, treatment risks and precautions. Pt prefers to treat via telemedicine empirically rather then risking or undertaking an in person visit at this moment.    I discussed the assessment and treatment plan with the patient. The patient was provided an opportunity to ask questions and all were answered. The patient agreed with the plan and demonstrated an understanding of the instructions.   The patient was advised to call back or seek  an in-person evaluation if the symptoms worsen or if the condition fails to improve as anticipated.  Advised if desired AVS can be mailed or viewed via MyChart if Mychart user.   Rollene Northern, FNP

## 2024-07-05 NOTE — Telephone Encounter (Signed)
 Reviewed. Per discussion - appt 07/06/23 with Rollene.

## 2024-07-05 NOTE — Assessment & Plan Note (Signed)
 No acute respiratory distress. she is not labored in her speech over virtual visit.  She is afebrile.  Patient prefers to start Paxlovid  to potentially shorten duration of symptoms, minimize risk of severe symptoms.  Discussed drug interactions with Paxlovid , Armour Thyroid , oral estrogen.  She has follow-up with PCP and will inquire about TSH recheck.  She may continue Promethazine DM, Mucinex at home.  She will let me know of any new concerns.

## 2024-07-10 ENCOUNTER — Encounter: Payer: Self-pay | Admitting: Internal Medicine

## 2024-07-10 MED ORDER — TIRZEPATIDE-WEIGHT MANAGEMENT 2.5 MG/0.5ML ~~LOC~~ SOLN: 2.5000 mg | SUBCUTANEOUS | 2 refills | Status: AC

## 2024-07-10 NOTE — Telephone Encounter (Signed)
 Rx sent in for zepbound  2.5mg  per request.

## 2024-07-10 NOTE — Telephone Encounter (Signed)
 Pt requesting

## 2024-07-13 ENCOUNTER — Telehealth: Admitting: Physician Assistant

## 2024-07-13 DIAGNOSIS — R3989 Other symptoms and signs involving the genitourinary system: Secondary | ICD-10-CM | POA: Diagnosis not present

## 2024-07-13 MED ORDER — CEPHALEXIN 500 MG PO CAPS: 500.0000 mg | ORAL_CAPSULE | Freq: Two times a day (BID) | ORAL | 0 refills | Status: AC

## 2024-07-13 NOTE — Progress Notes (Signed)

## 2024-07-17 ENCOUNTER — Other Ambulatory Visit (INDEPENDENT_AMBULATORY_CARE_PROVIDER_SITE_OTHER)

## 2024-07-17 DIAGNOSIS — E7800 Pure hypercholesterolemia, unspecified: Secondary | ICD-10-CM | POA: Diagnosis not present

## 2024-07-17 DIAGNOSIS — R739 Hyperglycemia, unspecified: Secondary | ICD-10-CM | POA: Diagnosis not present

## 2024-07-17 LAB — HEPATIC FUNCTION PANEL
ALT: 19 U/L (ref 3–35)
AST: 17 U/L (ref 5–37)
Albumin: 3.7 g/dL (ref 3.5–5.2)
Alkaline Phosphatase: 51 U/L (ref 39–117)
Bilirubin, Direct: 0.1 mg/dL (ref 0.1–0.3)
Total Bilirubin: 0.4 mg/dL (ref 0.2–1.2)
Total Protein: 5.9 g/dL — ABNORMAL LOW (ref 6.0–8.3)

## 2024-07-17 LAB — LIPID PANEL
Cholesterol: 205 mg/dL — ABNORMAL HIGH (ref 28–200)
HDL: 50.6 mg/dL
LDL Cholesterol: 131 mg/dL — ABNORMAL HIGH (ref 10–99)
NonHDL: 154.74
Total CHOL/HDL Ratio: 4
Triglycerides: 118 mg/dL (ref 10.0–149.0)
VLDL: 23.6 mg/dL (ref 0.0–40.0)

## 2024-07-17 LAB — BASIC METABOLIC PANEL WITH GFR
BUN: 17 mg/dL (ref 6–23)
CO2: 33 meq/L — ABNORMAL HIGH (ref 19–32)
Calcium: 8.7 mg/dL (ref 8.4–10.5)
Chloride: 105 meq/L (ref 96–112)
Creatinine, Ser: 0.56 mg/dL (ref 0.40–1.20)
GFR: 96.48 mL/min
Glucose, Bld: 95 mg/dL (ref 70–99)
Potassium: 4.3 meq/L (ref 3.5–5.1)
Sodium: 139 meq/L (ref 135–145)

## 2024-07-17 LAB — HEMOGLOBIN A1C: Hgb A1c MFr Bld: 5.8 % (ref 4.6–6.5)

## 2024-07-18 ENCOUNTER — Ambulatory Visit: Payer: Self-pay | Admitting: Internal Medicine

## 2024-07-19 ENCOUNTER — Ambulatory Visit: Admitting: Internal Medicine

## 2024-07-19 ENCOUNTER — Encounter: Payer: Self-pay | Admitting: Internal Medicine

## 2024-07-19 VITALS — BP 120/76 | HR 74 | Temp 98.0°F | Ht 65.0 in | Wt 168.0 lb

## 2024-07-19 DIAGNOSIS — Z23 Encounter for immunization: Secondary | ICD-10-CM

## 2024-07-19 DIAGNOSIS — N6489 Other specified disorders of breast: Secondary | ICD-10-CM | POA: Diagnosis not present

## 2024-07-19 DIAGNOSIS — E7800 Pure hypercholesterolemia, unspecified: Secondary | ICD-10-CM | POA: Diagnosis not present

## 2024-07-19 DIAGNOSIS — F439 Reaction to severe stress, unspecified: Secondary | ICD-10-CM | POA: Diagnosis not present

## 2024-07-19 DIAGNOSIS — E559 Vitamin D deficiency, unspecified: Secondary | ICD-10-CM

## 2024-07-19 DIAGNOSIS — Z713 Dietary counseling and surveillance: Secondary | ICD-10-CM | POA: Diagnosis not present

## 2024-07-19 DIAGNOSIS — K219 Gastro-esophageal reflux disease without esophagitis: Secondary | ICD-10-CM

## 2024-07-19 DIAGNOSIS — R739 Hyperglycemia, unspecified: Secondary | ICD-10-CM

## 2024-07-19 DIAGNOSIS — E039 Hypothyroidism, unspecified: Secondary | ICD-10-CM

## 2024-07-19 MED ORDER — SERTRALINE HCL 50 MG PO TABS: ORAL_TABLET | ORAL | 1 refills | Status: AC

## 2024-07-19 MED ORDER — ARMOUR THYROID 90 MG PO TABS: ORAL_TABLET | ORAL | 1 refills | Status: AC

## 2024-07-19 MED ORDER — PROGESTERONE MICRONIZED 100 MG PO CAPS: ORAL_CAPSULE | ORAL | 1 refills | Status: AC

## 2024-07-19 NOTE — Progress Notes (Signed)
 "  Subjective:    Patient ID: Carol Jimenez, female    DOB: Jan 04, 1961, 64 y.o.   MRN: [id number]  Patient here for  Chief Complaint  Patient presents with   Medical Management of Chronic Issues    HPI Here for a scheduled follow up - follow up regarding weight loss and zepbound  use, OSA and hypercholesterolemia. Had f/u with Dr Jess 12/15/23 - f/u OSA. Continues cpap. Evaluated 06/26/24 - diagnosed with sinus infection. Treated with augmentin . Doing better. Evaluated 07/05/24 - after testing positive for covid 07/04/24. Treated with paxlovid . Evlauated for UTI 07/13/24 - treated with keflex . Continues on zepbound . Tolerating. No chest pain or sob reported. No abdominal pain or bowel change reported. Discussed vaccines- pneumonia vaccine. Not taking hydrochlorothiazide . Blood pressure doing well.    Past Medical History:  Diagnosis Date   Allergy    Arthritis 2015   knees, back   Hx: UTI (urinary tract infection)    Hypothyroidism    Sleep apnea 2025   using CPAP   Past Surgical History:  Procedure Laterality Date   BREAST BIOPSY Left 2015   negative, stereotactic biopsy   CESAREAN SECTION     ENDOMETRIAL ABLATION  2000   JOINT REPLACEMENT  09-30-2021   KNEE SURGERY Left    TUBAL LIGATION  1993   Family History  Problem Relation Age of Onset   Congestive Heart Failure Mother    Diabetes Mother    COPD Mother    Heart disease Mother    Hypertension Father    Esophageal cancer Father 19       treated at [facility]   Colon polyps Other    Hypertension Sister    Hypertension Sister    Diabetes Sister    Breast cancer Neg Hx    Social History   Socioeconomic History   Marital status: Married    Spouse name: Not on file   Number of children: Not on file   Years of education: Not on file   Highest education level: Bachelor's degree (e.g., BA, AB, BS)  Occupational History   Not on file  Tobacco Use   Smoking status: Never   Smokeless tobacco: Never  Vaping Use   Vaping  status: Never Used  Substance and Sexual Activity   Alcohol use: Yes    Alcohol/week: 0.0 standard drinks of alcohol    Comment: rare   Drug use: No   Sexual activity: Not on file  Other Topics Concern   Not on file  Social History Narrative   Not on file   Social Drivers of Health   Tobacco Use: Low Risk (07/23/2024)   Patient History    Smoking Tobacco Use: Never    Smokeless Tobacco Use: Never    Passive Exposure: Not on file  Financial Resource Strain: Low Risk (07/05/2024)   Overall Financial Resource Strain (CARDIA)    Difficulty of Paying Living Expenses: Not hard at all  Food Insecurity: No Food Insecurity (07/05/2024)   Epic    Worried About Radiation Protection Practitioner of Food in the Last Year: Never true    Ran Out of Food in the Last Year: Never true  Transportation Needs: No Transportation Needs (07/05/2024)   Epic    Lack of Transportation (Medical): No    Lack of Transportation (Non-Medical): No  Physical Activity: Insufficiently Active (07/05/2024)   Exercise Vital Sign    Days of Exercise per Week: 1 day    Minutes of Exercise per Session: 10 min  Stress: No Stress Concern Present (07/05/2024)   [employer] of Occupational Health - Occupational Stress Questionnaire    Feeling of Stress: Not at all  Social Connections: Socially Integrated (07/05/2024)   Social Connection and Isolation Panel    Frequency of Communication with Friends and Family: More than three times a week    Frequency of Social Gatherings with Friends and Family: Once a week    Attends Religious Services: More than 4 times per year    Active Member of Clubs or Organizations: Yes    Attends Banker Meetings: More than 4 times per year    Marital Status: Married  Depression (PHQ2-9): Low Risk (07/05/2024)   Depression (PHQ2-9)    PHQ-2 Score: 0  Alcohol Screen: Low Risk (07/05/2024)   Alcohol Screen    Last Alcohol Screening Score (AUDIT): 1  Housing: Low Risk (07/05/2024)   Epic    Unable to Pay  for Housing in the Last Year: No    Number of Times Moved in the Last Year: 0    Homeless in the Last Year: No  Utilities: Not on file  Health Literacy: Not on file     Review of Systems  Constitutional:  Negative for appetite change and unexpected weight change.  HENT:  Negative for congestion and sinus pressure.   Respiratory:  Negative for cough, chest tightness and shortness of breath.   Cardiovascular:  Negative for chest pain, palpitations and leg swelling.  Gastrointestinal:  Negative for abdominal pain, diarrhea, nausea and vomiting.  Genitourinary:  Negative for difficulty urinating and dysuria.  Musculoskeletal:  Negative for joint swelling and myalgias.  Skin:  Negative for color change and rash.  Neurological:  Negative for dizziness and headaches.  Psychiatric/Behavioral:  Negative for agitation and dysphoric mood.        Objective:     BP 120/76   Pulse 74   Temp 98 F (36.7 C) (Oral)   Ht 5' 5 (1.651 m)   Wt 168 lb (76.2 kg)   LMP 12/26/2012   SpO2 97%   BMI 27.96 kg/m  Wt Readings from Last 3 Encounters:  07/19/24 168 lb (76.2 kg)  07/05/24 167 lb 1.6 oz (75.8 kg)  03/16/24 167 lb 9.6 oz (76 kg)    Physical Exam Vitals reviewed.  Constitutional:      General: She is not in acute distress.    Appearance: Normal appearance.  HENT:     Head: Normocephalic and atraumatic.     Right Ear: External ear normal.     Left Ear: External ear normal.     Mouth/Throat:     Pharynx: No oropharyngeal exudate or posterior oropharyngeal erythema.  Eyes:     General: No scleral icterus.       Right eye: No discharge.        Left eye: No discharge.     Conjunctiva/sclera: Conjunctivae normal.  Neck:     Thyroid : No thyromegaly.  Cardiovascular:     Rate and Rhythm: Normal rate and regular rhythm.  Pulmonary:     Effort: No respiratory distress.     Breath sounds: Normal breath sounds. No wheezing.  Abdominal:     General: Bowel sounds are normal.      Palpations: Abdomen is soft.     Tenderness: There is no abdominal tenderness.  Musculoskeletal:        General: No swelling or tenderness.     Cervical back: Neck supple. No tenderness.  Lymphadenopathy:  Cervical: No cervical adenopathy.  Skin:    Findings: No erythema or rash.  Neurological:     Mental Status: She is alert.  Psychiatric:        Mood and Affect: Mood normal.        Behavior: Behavior normal.         Outpatient Encounter Medications as of 07/19/2024  Medication Sig   cephALEXin  (KEFLEX ) 500 MG capsule Take 1 capsule (500 mg total) by mouth 2 (two) times daily.   estradiol  (ESTRACE ) 0.1 MG/GM vaginal cream INSERT 1 APPLICATORFUL VAGINALLY 2 TIMES A WEEK (Patient taking differently: as needed. INSERT 1 APPLICATORFUL VAGINALLY 2 TIMES A WEEK)   estradiol  (ESTRACE ) 0.5 MG tablet Take 1-2 tablets (0.5-1 mg total) by mouth daily.   meloxicam  (MOBIC ) 7.5 MG tablet Take 2 tablets (15 mg total) by mouth daily as needed for pain.   pantoprazole  (PROTONIX ) 20 MG tablet Take 1 tablet (20 mg total) by mouth daily. (Patient taking differently: Take 20 mg by mouth as needed.)   tirzepatide  (ZEPBOUND ) 2.5 MG/0.5ML injection vial Inject 2.5 mg into the skin once a week.   ARMOUR THYROID  90 MG tablet TAKE 1 TABLET(90 MG) BY MOUTH DAILY   progesterone  (PROMETRIUM ) 100 MG capsule TAKE 1 CAPSULE(100 MG) BY MOUTH DAILY   sertraline  (ZOLOFT ) 50 MG tablet TAKE 1 TABLET(50 MG) BY MOUTH DAILY   [DISCONTINUED] ARMOUR THYROID  90 MG tablet TAKE 1 TABLET(90 MG) BY MOUTH DAILY   [DISCONTINUED] hydrochlorothiazide  (HYDRODIURIL ) 25 MG tablet TAKE 1/2 TABLET BY MOUTH DAILY (Patient not taking: Reported on 07/19/2024)   [DISCONTINUED] progesterone  (PROMETRIUM ) 100 MG capsule TAKE 1 CAPSULE(100 MG) BY MOUTH DAILY   [DISCONTINUED] sertraline  (ZOLOFT ) 50 MG tablet TAKE 1 TABLET(50 MG) BY MOUTH DAILY   No facility-administered encounter medications on file as of 07/19/2024.     Lab Results   Component Value Date   WBC 6.9 07/08/2023   HGB 15.6 (H) 07/08/2023   HCT 46.7 (H) 07/08/2023   PLT 238.0 07/08/2023   GLUCOSE 95 07/17/2024   CHOL 205 (H) 07/17/2024   TRIG 118.0 07/17/2024   HDL 50.60 07/17/2024   LDLDIRECT 145.0 11/01/2022   LDLCALC 131 (H) 07/17/2024   ALT 19 07/17/2024   AST 17 07/17/2024   NA 139 07/17/2024   K 4.3 07/17/2024   CL 105 07/17/2024   CREATININE 0.56 07/17/2024   BUN 17 07/17/2024   CO2 33 (H) 07/17/2024   TSH 0.55 03/13/2024   HGBA1C 5.8 07/17/2024    MM 3D SCREENING MAMMOGRAM BILATERAL BREAST Result Date: 11/29/2023 CLINICAL DATA:  Screening. EXAM: DIGITAL SCREENING BILATERAL MAMMOGRAM WITH TOMOSYNTHESIS AND CAD TECHNIQUE: Bilateral screening digital craniocaudal and mediolateral oblique mammograms were obtained. Bilateral screening digital breast tomosynthesis was performed. The images were evaluated with computer-aided detection. COMPARISON:  Previous exam(s). ACR Breast Density Category b: There are scattered areas of fibroglandular density. FINDINGS: There are no findings suspicious for malignancy. IMPRESSION: No mammographic evidence of malignancy. A result letter of this screening mammogram will be mailed directly to the patient. RECOMMENDATION: Screening mammogram in one year. (Code:SM-B-01Y) BI-RADS CATEGORY  1: Negative. Electronically Signed   By: Alm Parkins M.D.   On: 11/29/2023 08:58       Assessment & Plan:  Need for pneumococcal 20-valent conjugate vaccination -     Pneumococcal conjugate vaccine 20-valent  Hypercholesterolemia Assessment & Plan: The 10-year ASCVD risk score (Arnett DK, et al., 2019) is: 4.7%   Values used to calculate the score:  Age: 78 years     Clinically relevant sex: Female     Is Non-Hispanic African American: No     Diabetic: No     Tobacco smoker: No     Systolic Blood Pressure: 120 mmHg     Is BP treated: No     HDL Cholesterol: 50.6 mg/dL     Total Cholesterol: 205 mg/dL  Low  cholesterol diet and exercise.  Follow lipid panel.   Orders: -     Hepatic function panel; Future -     Lipid panel; Future -     Basic metabolic panel with GFR; Future -     CBC with Differential/Platelet; Future  Hyperglycemia Assessment & Plan: Low carb diet and exercise.  Follow met b and A1c.  Lab Results  Component Value Date   HGBA1C 5.8 07/17/2024    Orders: -     Hemoglobin A1c; Future  Gastroesophageal reflux disease, unspecified whether esophagitis present Assessment & Plan: No upper symptoms reported. Has protonix . Only takes as needed. Not needing daily.    Hypothyroidism, unspecified type Assessment & Plan: On thyroid  replacement. Follow tsh.    Pseudoangiomatous stromal hyperplasia of breast Assessment & Plan: Previously saw Dr Dessa.  Mammogram 11/29/23 - Birads I.    Stress Assessment & Plan: Continues on zoloft . Stable. Appears to be doing well.    Vitamin D  deficiency Assessment & Plan: Vitamin D  level 02/2024 - wnl.    Weight loss counseling, encounter for Assessment & Plan: Continue zepbound . Taking 2.5mg  weekly. Maintenance dose. Wants to continue for now. Continue diet and exercise. Follow.    Other orders -     Armour Thyroid ; TAKE 1 TABLET(90 MG) BY MOUTH DAILY  Dispense: 90 tablet; Refill: 1 -     Progesterone ; TAKE 1 CAPSULE(100 MG) BY MOUTH DAILY  Dispense: 90 capsule; Refill: 1 -     Sertraline  HCl; TAKE 1 TABLET(50 MG) BY MOUTH DAILY  Dispense: 90 tablet; Refill: 1     Allena Hamilton, MD "

## 2024-07-23 ENCOUNTER — Encounter: Payer: Self-pay | Admitting: Internal Medicine

## 2024-07-23 NOTE — Assessment & Plan Note (Signed)
 Continue zepbound . Taking 2.5mg  weekly. Maintenance dose. Wants to continue for now. Continue diet and exercise. Follow.

## 2024-07-23 NOTE — Assessment & Plan Note (Signed)
 The 10-year ASCVD risk score (Arnett DK, et al., 2019) is: 4.7%   Values used to calculate the score:     Age: 64 years     Clinically relevant sex: Female     Is Non-Hispanic African American: No     Diabetic: No     Tobacco smoker: No     Systolic Blood Pressure: 120 mmHg     Is BP treated: No     HDL Cholesterol: 50.6 mg/dL     Total Cholesterol: 205 mg/dL  Low cholesterol diet and exercise.  Follow lipid panel.

## 2024-07-23 NOTE — Assessment & Plan Note (Signed)
 Low carb diet and exercise.  Follow met b and A1c.  Lab Results  Component Value Date   HGBA1C 5.8 07/17/2024

## 2024-07-23 NOTE — Assessment & Plan Note (Signed)
 No upper symptoms reported. Has protonix . Only takes as needed. Not needing daily.

## 2024-07-23 NOTE — Assessment & Plan Note (Signed)
 Vitamin D  level 02/2024 - wnl.

## 2024-07-23 NOTE — Assessment & Plan Note (Signed)
 On thyroid replacement.  Follow tsh.

## 2024-07-23 NOTE — Assessment & Plan Note (Signed)
 Previously saw Dr Dessa.  Mammogram 11/29/23 - Birads I.

## 2024-07-23 NOTE — Assessment & Plan Note (Signed)
 Continues on zoloft . Stable. Appears to be doing well.

## 2024-11-14 ENCOUNTER — Other Ambulatory Visit

## 2024-11-16 ENCOUNTER — Encounter: Admitting: Internal Medicine
